# Patient Record
Sex: Female | Born: 1965 | Race: Black or African American | Hispanic: No | Marital: Single | Smoking: Former smoker
Health system: Southern US, Community
[De-identification: ages and names within clinical notes are randomized; demographics above are authoritative.]

## PROBLEM LIST (undated history)

## (undated) DIAGNOSIS — A749 Chlamydial infection, unspecified: Secondary | ICD-10-CM

## (undated) DIAGNOSIS — E079 Disorder of thyroid, unspecified: Secondary | ICD-10-CM

## (undated) DIAGNOSIS — I2510 Atherosclerotic heart disease of native coronary artery without angina pectoris: Secondary | ICD-10-CM

## (undated) DIAGNOSIS — I219 Acute myocardial infarction, unspecified: Secondary | ICD-10-CM

## (undated) DIAGNOSIS — F329 Major depressive disorder, single episode, unspecified: Secondary | ICD-10-CM

## (undated) DIAGNOSIS — N649 Disorder of breast, unspecified: Secondary | ICD-10-CM

## (undated) DIAGNOSIS — F1910 Other psychoactive substance abuse, uncomplicated: Secondary | ICD-10-CM

## (undated) DIAGNOSIS — R87619 Unspecified abnormal cytological findings in specimens from cervix uteri: Secondary | ICD-10-CM

## (undated) DIAGNOSIS — E7800 Pure hypercholesterolemia, unspecified: Secondary | ICD-10-CM

## (undated) DIAGNOSIS — IMO0002 Reserved for concepts with insufficient information to code with codable children: Secondary | ICD-10-CM

## (undated) DIAGNOSIS — R7303 Prediabetes: Secondary | ICD-10-CM

## (undated) DIAGNOSIS — G43909 Migraine, unspecified, not intractable, without status migrainosus: Secondary | ICD-10-CM

## (undated) DIAGNOSIS — F419 Anxiety disorder, unspecified: Principal | ICD-10-CM

## (undated) DIAGNOSIS — A599 Trichomoniasis, unspecified: Secondary | ICD-10-CM

## (undated) DIAGNOSIS — F32A Depression, unspecified: Secondary | ICD-10-CM

## (undated) DIAGNOSIS — I10 Essential (primary) hypertension: Secondary | ICD-10-CM

## (undated) DIAGNOSIS — E0590 Thyrotoxicosis, unspecified without thyrotoxic crisis or storm: Secondary | ICD-10-CM

## (undated) DIAGNOSIS — I519 Heart disease, unspecified: Secondary | ICD-10-CM

## (undated) DIAGNOSIS — D649 Anemia, unspecified: Secondary | ICD-10-CM

## (undated) HISTORY — PX: BREAST EXCISIONAL BIOPSY: SUR124

## (undated) HISTORY — PX: ENDOMETRIAL ABLATION W/ NOVASURE: SUR434

## (undated) HISTORY — PX: CERVICAL CONE BIOPSY: SUR198

## (undated) HISTORY — DX: Chlamydial infection, unspecified: A74.9

## (undated) HISTORY — DX: Unspecified abnormal cytological findings in specimens from cervix uteri: R87.619

## (undated) HISTORY — PX: ECTOPIC PREGNANCY SURGERY: SHX613

## (undated) HISTORY — PX: BREAST SURGERY: SHX581

## (undated) HISTORY — DX: Disorder of breast, unspecified: N64.9

## (undated) HISTORY — DX: Anemia, unspecified: D64.9

## (undated) HISTORY — DX: Reserved for concepts with insufficient information to code with codable children: IMO0002

## (undated) HISTORY — PX: GROIN MASS OPEN BIOPSY: SHX1714

## (undated) HISTORY — PX: TUBAL LIGATION: SHX77

## (undated) HISTORY — DX: Trichomoniasis, unspecified: A59.9

## (undated) HISTORY — DX: Major depressive disorder, single episode, unspecified: F32.9

## (undated) HISTORY — DX: Anxiety disorder, unspecified: F41.9

---

## 1998-06-13 ENCOUNTER — Other Ambulatory Visit: Admission: RE | Admit: 1998-06-13 | Discharge: 1998-06-13 | Payer: Self-pay | Admitting: Obstetrics

## 2000-02-21 ENCOUNTER — Other Ambulatory Visit: Admission: RE | Admit: 2000-02-21 | Discharge: 2000-02-21 | Payer: Self-pay | Admitting: Obstetrics

## 2002-11-19 ENCOUNTER — Ambulatory Visit (HOSPITAL_COMMUNITY): Admission: RE | Admit: 2002-11-19 | Discharge: 2002-11-19 | Payer: Self-pay | Admitting: Obstetrics

## 2002-11-19 ENCOUNTER — Encounter: Payer: Self-pay | Admitting: Obstetrics

## 2004-08-08 ENCOUNTER — Emergency Department (HOSPITAL_COMMUNITY): Admission: EM | Admit: 2004-08-08 | Discharge: 2004-08-08 | Payer: Self-pay | Admitting: Emergency Medicine

## 2006-10-29 ENCOUNTER — Other Ambulatory Visit: Admission: RE | Admit: 2006-10-29 | Discharge: 2006-10-29 | Payer: Self-pay | Admitting: Family Medicine

## 2007-03-19 ENCOUNTER — Encounter (INDEPENDENT_AMBULATORY_CARE_PROVIDER_SITE_OTHER): Payer: Self-pay | Admitting: Obstetrics

## 2007-03-19 ENCOUNTER — Ambulatory Visit (HOSPITAL_COMMUNITY): Admission: RE | Admit: 2007-03-19 | Discharge: 2007-03-19 | Payer: Self-pay | Admitting: Obstetrics

## 2007-07-28 ENCOUNTER — Emergency Department (HOSPITAL_COMMUNITY): Admission: EM | Admit: 2007-07-28 | Discharge: 2007-07-28 | Payer: Self-pay | Admitting: Emergency Medicine

## 2009-04-20 ENCOUNTER — Emergency Department (HOSPITAL_COMMUNITY): Admission: EM | Admit: 2009-04-20 | Discharge: 2009-04-20 | Payer: Self-pay | Admitting: Emergency Medicine

## 2010-01-04 ENCOUNTER — Emergency Department (HOSPITAL_COMMUNITY)
Admission: EM | Admit: 2010-01-04 | Discharge: 2010-01-04 | Payer: Self-pay | Source: Home / Self Care | Admitting: Emergency Medicine

## 2010-02-18 ENCOUNTER — Encounter: Payer: Self-pay | Admitting: Obstetrics

## 2010-06-12 NOTE — Op Note (Signed)
NAME:  [name], [name] NO.:  [id number]   MEDICAL RECORD NO.:  [id number]          PATIENT TYPE:  AMB   LOCATION:  SDC                           FACILITY:  [facility]   PHYSICIAN:  Kathreen Cosier, M.D.DATE OF BIRTH:  Jul 01, 1965   DATE OF PROCEDURE:  03/19/2007  DATE OF DISCHARGE:                               OPERATIVE REPORT   PREOPERATIVE DIAGNOSIS:  Dysfunctional uterine bleeding.   POSTOPERATIVE DIAGNOSIS:  Endometrial polyp.   PROCEDURE:  Hysteroscopy D&C, NovaSure ablation.  Using MAC, patient in  lithotomy position, perineum and vagina prepped and draped, bladder  emptied with a straight catheter.  Bimanual exam revealed uterus to be  normal size, negative adnexa.  Speculum placed in the vagina.  Cervix  grasped with a tenaculum.  Endocervix curetted.  Small amount of tissue  obtained.  Endometrial cavity sounded to 11 cm.  Cervical length  measured at 6 cm, giving a cavity length of 5 cm.  Cervix dilated #27  Shawnie Pons and the hysteroscope inserted.  She was noted to have a large  endometrial polyp.  Scope removed and a sharp curettage performed, a  large amount of tissue obtained.  NovaSure device inserted and the  cavity integrity test passed.  Cavity width was 4.7 cm.  The cavity was  ablated at a power of 108 watts for 1 minute and 35 seconds.  Repeat  hysteroscopy repeated and revealed that the cavity to be totally  ablated.  Fluid deficit was 85 mL.  The patient tolerated the procedure  well and taken to recovery room in good condition.           ______________________________  Kathreen Cosier, M.D.     BAM/MEDQ  D:  03/19/2007  T:  03/20/2007  Job:  [id number]

## 2010-10-19 LAB — CBC
HCT: 36
Hemoglobin: 12.2
MCHC: 33.9
MCV: 82.1
Platelets: 241
RBC: 4.38
RDW: 14.4
WBC: 5

## 2010-10-19 LAB — PREGNANCY, URINE: Preg Test, Ur: NEGATIVE

## 2010-11-05 ENCOUNTER — Other Ambulatory Visit: Payer: Self-pay | Admitting: Obstetrics and Gynecology

## 2010-11-05 DIAGNOSIS — Z1231 Encounter for screening mammogram for malignant neoplasm of breast: Secondary | ICD-10-CM

## 2010-11-06 ENCOUNTER — Ambulatory Visit (INDEPENDENT_AMBULATORY_CARE_PROVIDER_SITE_OTHER): Payer: Self-pay | Admitting: *Deleted

## 2010-11-06 ENCOUNTER — Encounter (HOSPITAL_COMMUNITY): Payer: Self-pay

## 2010-11-06 ENCOUNTER — Other Ambulatory Visit: Payer: Self-pay | Admitting: Obstetrics and Gynecology

## 2010-11-06 ENCOUNTER — Encounter: Payer: Self-pay | Admitting: *Deleted

## 2010-11-06 ENCOUNTER — Ambulatory Visit (HOSPITAL_COMMUNITY): Payer: Self-pay | Attending: Obstetrics and Gynecology

## 2010-11-06 VITALS — BP 113/81 | HR 74 | Temp 97.3°F | Ht 63.0 in | Wt 116.7 lb

## 2010-11-06 DIAGNOSIS — N63 Unspecified lump in unspecified breast: Secondary | ICD-10-CM

## 2010-11-06 DIAGNOSIS — Z01419 Encounter for gynecological examination (general) (routine) without abnormal findings: Secondary | ICD-10-CM

## 2010-11-06 NOTE — Progress Notes (Signed)
No complaints today.  Pap Smear:    Pap smear Completed today. Last Pap smear was four years ago per patient. Patient has had a history of an abnormal Pap smear many years ago. No Pap smear results in EPIC.  Physical exam: Breasts      Breasts symmetrical. No skin abnormalities. No nipple retraction and no nipple discharge.No lymphadenopathy. No lumps palpated in left breast. Small pea sized lump palpated in the right upper breast. Referred patient to the [facility] of [facility] for diagnostic mammogram and possible ultrasound on November 08, 2010 at 0930.     Pelvic/Bimanual   Ext Genitalia      No lesions and no swelling observed on external genitalia. No discharge observed.     Vagina      Vagina pink and of normal texture. No lesions observed. Thick white vaginal discharge observed with odor. Patient did state she noticed some vaginal discharge lately.  Wet prep obtained.     Cervix      Cervix present. Cervix pink and normal texture.        Uterus      Uterus present and palpable. Uterus of normal size and position.       Adnexae      Ovaries present. Ovaries not palpable. No tenderness on palpation.     Rectovaginal      No rectal exam completed. No skin abnormalities in rectal area observed on exam.

## 2010-11-06 NOTE — Patient Instructions (Signed)
Taught patient how to do BSE and gave patient education materials. Referred patient to the [facility] of [facility] for Diagnostic Mammogram and possible ultrasound. Appointment is scheduled for November 08, 2010 at 0930. Gave patient appointment and patient stated she would be there. Told patient will call with results. Patient verbalized understanding.

## 2010-11-07 LAB — WET PREP, GENITAL

## 2010-11-08 ENCOUNTER — Ambulatory Visit
Admission: RE | Admit: 2010-11-08 | Discharge: 2010-11-08 | Disposition: A | Discharge status: Home / Self Care | Payer: No Typology Code available for payment source | Source: Ambulatory Visit | Attending: Obstetrics and Gynecology | Admitting: Obstetrics and Gynecology

## 2010-11-08 ENCOUNTER — Other Ambulatory Visit: Payer: Self-pay | Admitting: Obstetrics and Gynecology

## 2010-11-08 DIAGNOSIS — N63 Unspecified lump in unspecified breast: Secondary | ICD-10-CM

## 2010-11-09 ENCOUNTER — Telehealth: Payer: Self-pay | Admitting: *Deleted

## 2010-11-09 NOTE — Telephone Encounter (Signed)
Called patient at 1250 to discuss results from [facility] clinic on 11/06/10. Patients pap smear and wet prep came back showing Trichomonas. Told patient the results. Dr. Jolayne Panther has ordered a prescription for Flagyl 500 mg PO BID for 7 days with no refills. Asked patient which pharmacy she would like this medication sent to. [facility] is cheaper than [facility] and patient doesn't have any insurance. She asked if I would call into [facility] at [facility] [address] [phone]). Told patient her partner would need to be treated also. Told her he can go to the [facility] Department if he does not have a physician. Told her to not drink alcohol while using this prescription and the side effects. Talked with patient about diagnostic mammogram and ultrasound results. Told her that she needs have a left breast ultrasound completed in 6 months to follow up. Told patient per BCCCP guidelines will not need a pap smear for 3 years but let her know if would like to have one done at 1-2 years can come to one of our free pap smear screenings that we offer. Patient verbalized understanding.  Called [facility] pharmacy per patients request at 1430 and spoke with Donnie. Gave order for Flagyl 500 mg PO BID for 7 days with no refills per Dr. Jolayne Panther. Order verified.

## 2011-04-03 ENCOUNTER — Other Ambulatory Visit: Payer: Self-pay | Admitting: Obstetrics and Gynecology

## 2011-04-03 DIAGNOSIS — N6009 Solitary cyst of unspecified breast: Secondary | ICD-10-CM

## 2011-05-07 ENCOUNTER — Ambulatory Visit
Admission: RE | Admit: 2011-05-07 | Discharge: 2011-05-07 | Disposition: A | Discharge status: Home / Self Care | Payer: No Typology Code available for payment source | Source: Ambulatory Visit | Attending: Obstetrics and Gynecology | Admitting: Obstetrics and Gynecology

## 2011-05-07 DIAGNOSIS — N6009 Solitary cyst of unspecified breast: Secondary | ICD-10-CM

## 2011-07-02 ENCOUNTER — Emergency Department (HOSPITAL_COMMUNITY)
Admission: EM | Admit: 2011-07-02 | Discharge: 2011-07-02 | Disposition: A | Discharge status: Home / Self Care | Payer: Self-pay | Attending: Emergency Medicine | Admitting: Emergency Medicine

## 2011-07-02 ENCOUNTER — Emergency Department (HOSPITAL_COMMUNITY): Payer: Self-pay

## 2011-07-02 ENCOUNTER — Encounter (HOSPITAL_COMMUNITY): Payer: Self-pay | Admitting: Emergency Medicine

## 2011-07-02 DIAGNOSIS — M79609 Pain in unspecified limb: Secondary | ICD-10-CM | POA: Insufficient documentation

## 2011-07-02 DIAGNOSIS — M25559 Pain in unspecified hip: Secondary | ICD-10-CM | POA: Insufficient documentation

## 2011-07-02 DIAGNOSIS — M25551 Pain in right hip: Secondary | ICD-10-CM

## 2011-07-02 LAB — URINALYSIS, ROUTINE W REFLEX MICROSCOPIC
Bilirubin Urine: NEGATIVE
Leukocytes, UA: NEGATIVE
Nitrite: NEGATIVE
Specific Gravity, Urine: 1.025 (ref 1.005–1.030)
pH: 6.5 (ref 5.0–8.0)

## 2011-07-02 MED ORDER — KETOROLAC TROMETHAMINE 30 MG/ML IJ SOLN
30.0000 mg | Freq: Once | INTRAMUSCULAR | Status: AC
  Administered 2011-07-02: 30 mg via INTRAMUSCULAR
  Filled 2011-07-02: qty 1

## 2011-07-02 MED ORDER — OXYCODONE-ACETAMINOPHEN 5-325 MG PO TABS
1.0000 | ORAL_TABLET | Freq: Once | ORAL | Status: AC
  Administered 2011-07-02: 1 via ORAL
  Filled 2011-07-02: qty 1

## 2011-07-02 MED ORDER — DIAZEPAM 5 MG PO TABS: 5.0000 mg | ORAL_TABLET | Freq: Two times a day (BID) | ORAL | Status: AC | PRN

## 2011-07-02 MED ORDER — NAPROXEN 500 MG PO TABS: 500.0000 mg | ORAL_TABLET | Freq: Two times a day (BID) | ORAL | Status: AC

## 2011-07-02 MED ORDER — CYCLOBENZAPRINE HCL 10 MG PO TABS
5.0000 mg | ORAL_TABLET | Freq: Once | ORAL | Status: AC
  Administered 2011-07-02: 5 mg via ORAL
  Filled 2011-07-02: qty 1

## 2011-07-02 NOTE — ED Notes (Signed)
Pt has returned from vascular lab. Pa is aware of study results

## 2011-07-02 NOTE — ED Provider Notes (Signed)
History     CSN: [id number]  Arrival date & time 07/02/11  0840   First MD Initiated Contact with Patient 07/02/11 [phone]      Chief Complaint  Patient presents with  . Hip Pain    (Consider location/radiation/quality/duration/timing/severity/associated sxs/prior treatment) HPI  46 year old female presents complaining of right hip and leg pain. Patient states yesterday when stepping off her Zenaida Niece she noticed some tenderness to her right hip. States she did not pay any attention to it. She went to sleep and this morning she woke up with significant pain to the right thigh.  Onset is gradual.  Pain is sharp, constant, worsening with positional change. She also experiencing tingling sensation to her thigh. There are no associated fever, chills, urinary symptoms, recent trauma, or rash. She denies any prior history of back pain and hip pain. Denies urinary/bowel incontinence, or caudal equina sxs.    Past Medical History  Diagnosis Date  . Abnormal Pap smear     Colpo (twice)  . Anemia   . Chlamydia     years ago  . Trichomonas     years ago  . Breast disorder     soreness in left breast   . Mental disorder     depression    Past Surgical History  Procedure Date  . Cesarean section     2 c-sections  . Breast surgery     lump removed from right breast  . Nova sure   . Groin mass open biopsy   . Cervical cone biopsy     Family History  Problem Relation Age of Onset  . Cancer Mother     breast  . Hypertension Brother   . Hypertension Sister     History  Substance Use Topics  . Smoking status: Current Everyday Smoker -- 0.5 packs/day for 28 years    Types: Cigarettes  . Smokeless tobacco: Never Used  . Alcohol Use: Yes    OB History    Grav Para Term Preterm Abortions TAB SAB Ect Mult Living   4 2 2  2  1 1  2       Review of Systems  Constitutional: Negative for fever.  Respiratory: Negative for shortness of breath.   Genitourinary: Negative for flank pain.    Musculoskeletal: Negative for back pain.  Skin: Negative for rash.    Allergies  Demerol and Novocain  Home Medications   Current Outpatient Rx  Name Route Sig Dispense Refill  . IBUPROFEN 200 MG PO TABS Oral Take 200 mg by mouth every 8 (eight) hours as needed. For pain    . LORATADINE 10 MG PO TABS Oral Take 10 mg by mouth daily.      BP 111/76  Pulse 86  Temp(Src) 97.2 F (36.2 C) (Oral)  Resp 18  SpO2 99%  Physical Exam  Nursing note and vitals reviewed. Constitutional: She appears well-developed and well-nourished.       Tearful, appears to be in pain  HENT:  Head: Normocephalic and atraumatic.  Eyes: Conjunctivae are normal.  Neck: Normal range of motion. Neck supple.  Pulmonary/Chest: She exhibits no tenderness.  Abdominal: There is no tenderness. There is no CVA tenderness.  Musculoskeletal: She exhibits no edema.       R hip: diffused tenderness on palpation without deformity noted.  Refused to perform ROM due to pain.    R thigh: tenderness to lateral aspect of mid thigh, without overlying skin changes noted.  Sensation intact.  Neurological:  She is alert.  Skin: Skin is warm. No rash noted.    ED Course  Procedures (including critical care time)  Labs Reviewed - No data to display No results found.   No diagnosis found.  Results for orders placed during the hospital encounter of 07/02/11  URINALYSIS, ROUTINE W REFLEX MICROSCOPIC      Component Value Range   Color, Urine YELLOW  YELLOW    APPearance CLEAR  CLEAR    Specific Gravity, Urine 1.025  1.005 - 1.030    pH 6.5  5.0 - 8.0    Glucose, UA NEGATIVE  NEGATIVE (mg/dL)   Hgb urine dipstick NEGATIVE  NEGATIVE    Bilirubin Urine NEGATIVE  NEGATIVE    Ketones, ur NEGATIVE  NEGATIVE (mg/dL)   Protein, ur NEGATIVE  NEGATIVE (mg/dL)   Urobilinogen, UA 1.0  0.0 - 1.0 (mg/dL)   Nitrite NEGATIVE  NEGATIVE    Leukocytes, UA NEGATIVE  NEGATIVE    Dg Hip Complete Right  07/02/2011  *RADIOLOGY REPORT*   Clinical Data: Pain  RIGHT HIP - COMPLETE 2+ VIEW  Comparison: None.  Findings: Three views of the right hip submitted.  No acute fracture or subluxation.  No radiopaque foreign body.  A few pelvic phleboliths are noted.  IMPRESSION: No acute fracture or subluxation.  Original Report Authenticated By: Natasha Mead, M.D.   Dg Femur Right  07/02/2011  *RADIOLOGY REPORT*  Clinical Data: Right femoral pain, no known injury  RIGHT FEMUR - 2 VIEW  Comparison: None  Findings: Bone mineralization normal. Joint spaces preserved. No fracture, dislocation, or bone destruction. Visualized portion of right pelvis appears intact.  IMPRESSION: Normal exam.  Original Report Authenticated By: Lollie Marrow, M.D.      MDM  R hip and R thigh pain, no recent trauma.  Pain reproducible to lateral aspect of R hip and R thigh, suggestive of musculoskeletal (muscle pulled).  No midline spine tenderness.  abd nontender, no abd bruits noted.  No red flags.  Pain medication and muscle relaxant given.  Will reexam.     10:38 AM Pt continues to endorse pain to her R hip and R thigh.  Sts it feels like hip is out of place.  Difficult to fully assess as pt refuse to move her extremity.  NO obvious dislocation noted.  Will xray R hip and R femur for further eval.   11:58 AM Pt denies any risk factors for DVT or PE.  However, due to her complaint of subjective thigh swelling, and xray of R hip and R femur shows no acute finding, i will order venous doppler study to r/o DVT.  UA ordered.   12:53 PM Pt continues to endorse constant pain.  Venous doppler shows no evidence of DVT.  UA negative for infection.  Xray unremarkable.  WIll give Toradol IM.  I discussed with my attending, who will see pt for further assessment.    2:52 PM Pain improves with Toradol. Will d/c with NSAIDS as treatment.  F/u instruction and strict return precaution discussed.  Pt voice understanding and agrees with plan.    Fayrene Helper, PA-C 07/02/11 1453

## 2011-07-02 NOTE — ED Notes (Signed)
Pt c/o right hip pain starting yesterday; pt denies obvious injury; pt sts hx of same in past

## 2011-07-02 NOTE — ED Provider Notes (Signed)
Medical screening examination/treatment/procedure(s) were conducted as a shared visit with non-physician practitioner(s) and myself.  I personally evaluated the patient during the encounter  Rt hip/lateral leg pain. No trauma. No h/o cancer. No h/o VTE. Denies numbness/tingling/weakness. No abdominal pain/n/v. Denies fever/chills.  No abd ttp/r/g. Min ttp lateral aspect of thigh, no diffuse thigh ttp or swelling. Min pain with int/ext rotation of hip.  XR . Venous ultrasound negative for DVT. The patient does complain you pain. I think that given the fact that she's not on any statin as well as the localizing thigh pain but this is less likely a myositis or inflammatory process of the muscle. She's not do strenuous physical activities but in her respiratory band syndrome. There is no fracture on x-ray. No not have abdominal pain I think that her symptoms are less likely related to an intra-abdominal process or muscle abscess.  Forbes Cellar, MD 07/02/11 [phone]

## 2011-07-02 NOTE — Discharge Instructions (Signed)
Hip Pain  The hips join the upper legs to the lower pelvis. The bones, cartilage, tendons, and muscles of the hip joint perform a lot of work each day holding your body weight and allowing you to move around.  Hip pain is a common symptom. It can range from a minor ache to severe pain on 1 or both hips. Pain may be felt on the inside of the hip joint near the groin, or the outside near the buttocks and upper thigh. There may be swelling or stiffness as well. It occurs more often when a person walks or performs activity. There are many reasons hip pain can develop.  CAUSES   It is important to work with your caregiver to identify the cause since many conditions can impact the bones, cartilage, muscles, and tendons of the hips. Causes for hip pain include:   Broken (fractured) bones.   Separation of the thighbone from the hip socket (dislocation).   Torn cartilage of the hip joint.   Swelling (inflammation) of a tendon (tendonitis), the sac within the hip joint (bursitis), or a joint.   A weakening in the abdominal wall (hernia), affecting the nerves to the hip.   Arthritis in the hip joint or lining of the hip joint.   Pinched nerves in the back, hip, or upper thigh.   A bulging disc in the spine (herniated disc).   Rarely, bone infection or cancer.  DIAGNOSIS   The location of your hip pain will help your caregiver understand what may be causing the pain. A diagnosis is based on your medical history, your symptoms, results from your physical exam, and results from diagnostic tests. Diagnostic tests may include X-ray exams, a computerized magnetic scan (magnetic resonance imaging, MRI), or bone scan.  TREATMENT   Treatment will depend on the cause of your hip pain. Treatment may include:   Limiting activities and resting until symptoms improve.   Crutches or other walking supports (a cane or brace).   Ice, elevation, and compression.   Physical therapy or home exercises.   Shoe inserts or special  shoes.   Losing weight.   Medications to reduce pain.   Undergoing surgery.  HOME CARE INSTRUCTIONS    Only take over-the-counter or prescription medicines for pain, discomfort, or fever as directed by your caregiver.   Put ice on the injured area:   Put ice in a plastic bag.   Place a towel between your skin and the bag.   Leave the ice on for 15 to 20 minutes at a time, 3 to 4 times a day.   Keep your leg raised (elevated) when possible to lessen swelling.   Avoid activities that cause pain.   Follow specific exercises as directed by your caregiver.   Sleep with a pillow between your legs on your most comfortable side.   Record how often you have hip pain, the location of the pain, and what it feels like. This information may be helpful to you and your caregiver.   Ask your caregiver about returning to work or sports and whether you should drive.   Follow up with your caregiver for further exams, therapy, or testing as directed.  SEEK MEDICAL CARE IF:    Your pain or swelling continues or worsens after 1 week.   You are feeling unwell or have chills.   You have increasing difficulty with walking.   You have a loss of sensation or other new symptoms.   You have questions   or concerns.  SEEK IMMEDIATE MEDICAL CARE IF:    You cannot put weight on the affected hip.   You have fallen.   You have a sudden increase in pain and swelling in your hip.   You have a fever.  MAKE SURE YOU:    Understand these instructions.   Will watch your condition.   Will get help right away if you are not doing well or get worse.  Document Released: 07/04/2009 Document Revised: 01/03/2011 Document Reviewed: 07/04/2009  [employer] Patient Information 2012 [employer].

## 2011-07-02 NOTE — Progress Notes (Signed)
Orthopedic Tech Progress Note Patient Details:  [name] [name] 1965-09-14 [id number]  Ortho Devices Type of Ortho Device: Crutches Ortho Device/Splint Interventions: Application   Earsie Humm T 07/02/2011, 3:10 PM

## 2011-07-02 NOTE — ED Notes (Signed)
Pt medicated for pain after much anxiety expressed about getting a shot

## 2011-07-02 NOTE — ED Notes (Signed)
ORTHO PAGED FOR CRUTCHES

## 2011-07-02 NOTE — Progress Notes (Signed)
*  PRELIMINARY RESULTS* Vascular Ultrasound Right lower extremity venous duplex has been completed.  Preliminary findings: Right= No evidence of DVT or baker's cyst.  Farrel Demark RDMS 07/02/2011, 12:37 PM

## 2011-10-17 ENCOUNTER — Telehealth (HOSPITAL_COMMUNITY): Payer: Self-pay | Admitting: *Deleted

## 2011-10-17 NOTE — Telephone Encounter (Signed)
Telephoned patient at home # and left message with a female.

## 2011-10-17 NOTE — Telephone Encounter (Signed)
Patient returned call. Advised patient was time to schedule diagnostic mammogram and left breast ultrasound. (oct 2013) Patient will call to schedule.

## 2011-10-18 ENCOUNTER — Other Ambulatory Visit: Payer: Self-pay | Admitting: Obstetrics and Gynecology

## 2011-10-18 DIAGNOSIS — D249 Benign neoplasm of unspecified breast: Secondary | ICD-10-CM

## 2011-11-04 ENCOUNTER — Ambulatory Visit
Admission: RE | Admit: 2011-11-04 | Discharge: 2011-11-04 | Disposition: A | Discharge status: Home / Self Care | Payer: No Typology Code available for payment source | Source: Ambulatory Visit | Attending: Obstetrics and Gynecology | Admitting: Obstetrics and Gynecology

## 2011-11-04 DIAGNOSIS — D249 Benign neoplasm of unspecified breast: Secondary | ICD-10-CM

## 2012-06-08 ENCOUNTER — Other Ambulatory Visit: Payer: Self-pay | Admitting: *Deleted

## 2012-06-08 DIAGNOSIS — N63 Unspecified lump in unspecified breast: Secondary | ICD-10-CM

## 2012-06-09 ENCOUNTER — Ambulatory Visit (HOSPITAL_COMMUNITY)
Admission: RE | Admit: 2012-06-09 | Discharge: 2012-06-09 | Disposition: A | Discharge status: Home / Self Care | Payer: Self-pay | Source: Ambulatory Visit | Attending: Obstetrics and Gynecology | Admitting: Obstetrics and Gynecology

## 2012-06-09 ENCOUNTER — Ambulatory Visit (HOSPITAL_COMMUNITY): Payer: Self-pay

## 2012-06-09 ENCOUNTER — Encounter (HOSPITAL_COMMUNITY): Payer: Self-pay

## 2012-06-09 VITALS — BP 102/64 | Temp 98.7°F | Ht 63.0 in | Wt 131.6 lb

## 2012-06-09 DIAGNOSIS — Z1239 Encounter for other screening for malignant neoplasm of breast: Secondary | ICD-10-CM

## 2012-06-09 DIAGNOSIS — N6325 Unspecified lump in the left breast, overlapping quadrants: Secondary | ICD-10-CM | POA: Insufficient documentation

## 2012-06-09 HISTORY — DX: Depression, unspecified: F32.A

## 2012-06-09 HISTORY — DX: Major depressive disorder, single episode, unspecified: F32.9

## 2012-06-09 NOTE — Progress Notes (Signed)
Complaints of left breast lump  Pap Smear:    Pap smear not completed today. Last Pap smear was 11/06/2010 at [facility] and normal. Patient stated that she has a history of two abnormal Pap smears one that was in 1997 that she had a cold knife cone completed for follow up and one in 2001 that a colposcopy was completed. No history of abnormal Pap smears since. Last Pap smear result is in EPIC.     Physical exam: Breasts Breasts symmetrical. No skin abnormalities bilateral breasts. Slight bilateral breast nipple retraction. No nipple discharge bilateral breasts. No lymphadenopathy. No lumps palpated right breast. Palpated a lump within the left breast at 3 o'clock 1 cm from the nipple. Patient complained of tenderness on palpated of lump within left breast and around it. Referred patient to the [facility] for left breast ultrasound per recommendation. Appointment scheduled for Monday, Jun 15, 2012 at 1330.       Pelvic/Bimanual No Pap smear completed today since last Pap smear was 11/06/2010. Pap smear not indicated per BCCCP guidelines.

## 2012-06-09 NOTE — Patient Instructions (Addendum)
Taught patient how to perform BSE. Patient did not need a Pap smear today due to last Pap smear was 11/06/2010. Told patient about free cervical cancer screenings to receive a Pap smear if would like one later this year or let her know can schedule Pap smear with BCCCP after October 2014 due to her history of abnormal Pap smears. Referred patient to the [facility] for left breast ultrasound per recommendation. Appointment scheduled for Monday, Jun 15, 2012 at 1330. Patient aware of appointment and will be there. Patient verbalized understanding.

## 2012-06-15 ENCOUNTER — Ambulatory Visit
Admission: RE | Admit: 2012-06-15 | Discharge: 2012-06-15 | Disposition: A | Discharge status: Home / Self Care | Payer: No Typology Code available for payment source | Source: Ambulatory Visit | Attending: Obstetrics and Gynecology | Admitting: Obstetrics and Gynecology

## 2012-06-15 DIAGNOSIS — N63 Unspecified lump in unspecified breast: Secondary | ICD-10-CM

## 2012-10-12 ENCOUNTER — Other Ambulatory Visit: Payer: Self-pay | Admitting: *Deleted

## 2012-10-12 DIAGNOSIS — N6320 Unspecified lump in the left breast, unspecified quadrant: Secondary | ICD-10-CM

## 2012-10-13 ENCOUNTER — Encounter (HOSPITAL_COMMUNITY): Payer: Self-pay

## 2012-10-13 ENCOUNTER — Ambulatory Visit (HOSPITAL_COMMUNITY)
Admission: RE | Admit: 2012-10-13 | Discharge: 2012-10-13 | Disposition: A | Discharge status: Home / Self Care | Payer: No Typology Code available for payment source | Source: Ambulatory Visit | Attending: Obstetrics and Gynecology | Admitting: Obstetrics and Gynecology

## 2012-10-13 VITALS — BP 96/58 | Temp 98.8°F | Ht 63.0 in | Wt 128.8 lb

## 2012-10-13 DIAGNOSIS — N6315 Unspecified lump in the right breast, overlapping quadrants: Secondary | ICD-10-CM | POA: Insufficient documentation

## 2012-10-13 DIAGNOSIS — Z1239 Encounter for other screening for malignant neoplasm of breast: Secondary | ICD-10-CM

## 2012-10-13 NOTE — Assessment & Plan Note (Signed)
Referred patient to the [facility] of [facility] for diagnostic mammogram. Appointment scheduled for Friday, October 23, 2012 at 1400.

## 2012-10-13 NOTE — Patient Instructions (Signed)
Patient did not need a Pap smear today due to last Pap smear was 11/06/2010. Told patient about free cervical cancer screenings to receive a Pap smear later this year or early next year. Let her know BCCCP will cover Pap smears every 3 years unless has a history of abnormal Pap smears. Referred patient to the [facility] of [facility] for diagnostic mammogram. Appointment scheduled for Friday, October 23, 2012 at 1400. Patient aware of appointment and will be there. Smoking cessation discussed with patient.  [name] [name] verbalized understanding.  [name] [name], [name], RN 4:09 PM

## 2012-10-13 NOTE — Progress Notes (Addendum)
Complaints of left breast lump x 2 weeks that patient states is painful. Patient states pain is constant rating pain at a 6 out 10.  Pap Smear: Pap smear not completed today. Last Pap smear was 11/06/2010 at [facility] and normal. Patient stated that she has a history of two abnormal Pap smears one that was in 1997 that she had a cold knife cone completed for follow up and one in 2001 that a colposcopy was completed. No history of abnormal Pap smears since. Last Pap smear result is in EPIC.   Physical exam:  Breasts  Breasts symmetrical. No skin abnormalities bilateral breasts. Slight bilateral breast nipple retraction. No nipple discharge bilateral breasts. No lymphadenopathy. Palpated lump within the right breast at 9 o'clock 4 cm from the nipple. Palpated a moveable lump within the left breast at 3 o'clock 4 cm from the nipple. Patient complained of tenderness on palpated of lump within left breast and around it. Referred patient to the [facility] of [facility] for diagnostic mammogram. Appointment scheduled for Friday, October 23, 2012 at 1400.   Pelvic/Bimanual  No Pap smear completed today since last Pap smear was 11/06/2010. Pap smear not indicated per BCCCP guidelines.

## 2012-10-23 ENCOUNTER — Other Ambulatory Visit: Payer: Self-pay | Admitting: Obstetrics and Gynecology

## 2012-10-23 ENCOUNTER — Ambulatory Visit
Admission: RE | Admit: 2012-10-23 | Discharge: 2012-10-23 | Disposition: A | Discharge status: Home / Self Care | Payer: No Typology Code available for payment source | Source: Ambulatory Visit | Attending: Obstetrics and Gynecology | Admitting: Obstetrics and Gynecology

## 2012-10-23 DIAGNOSIS — N6320 Unspecified lump in the left breast, unspecified quadrant: Secondary | ICD-10-CM

## 2013-02-05 ENCOUNTER — Inpatient Hospital Stay (HOSPITAL_COMMUNITY)
Admission: EM | Admit: 2013-02-05 | Discharge: 2013-02-11 | DRG: 282 | Disposition: A | Discharge status: Home / Self Care | Payer: No Typology Code available for payment source | Attending: Cardiology | Admitting: Cardiology

## 2013-02-05 ENCOUNTER — Encounter (HOSPITAL_COMMUNITY): Payer: Self-pay | Admitting: Emergency Medicine

## 2013-02-05 ENCOUNTER — Emergency Department (HOSPITAL_COMMUNITY): Payer: Self-pay

## 2013-02-05 ENCOUNTER — Other Ambulatory Visit: Payer: Self-pay

## 2013-02-05 DIAGNOSIS — Z720 Tobacco use: Secondary | ICD-10-CM | POA: Diagnosis present

## 2013-02-05 DIAGNOSIS — I214 Non-ST elevation (NSTEMI) myocardial infarction: Principal | ICD-10-CM | POA: Diagnosis present

## 2013-02-05 DIAGNOSIS — F17200 Nicotine dependence, unspecified, uncomplicated: Secondary | ICD-10-CM | POA: Diagnosis present

## 2013-02-05 DIAGNOSIS — E0590 Thyrotoxicosis, unspecified without thyrotoxic crisis or storm: Secondary | ICD-10-CM | POA: Diagnosis present

## 2013-02-05 DIAGNOSIS — I2510 Atherosclerotic heart disease of native coronary artery without angina pectoris: Secondary | ICD-10-CM

## 2013-02-05 DIAGNOSIS — F1410 Cocaine abuse, uncomplicated: Secondary | ICD-10-CM | POA: Diagnosis present

## 2013-02-05 DIAGNOSIS — D649 Anemia, unspecified: Secondary | ICD-10-CM | POA: Diagnosis present

## 2013-02-05 DIAGNOSIS — E876 Hypokalemia: Secondary | ICD-10-CM | POA: Diagnosis present

## 2013-02-05 DIAGNOSIS — E079 Disorder of thyroid, unspecified: Secondary | ICD-10-CM | POA: Diagnosis present

## 2013-02-05 DIAGNOSIS — F1210 Cannabis abuse, uncomplicated: Secondary | ICD-10-CM | POA: Diagnosis present

## 2013-02-05 DIAGNOSIS — F1910 Other psychoactive substance abuse, uncomplicated: Secondary | ICD-10-CM | POA: Diagnosis present

## 2013-02-05 DIAGNOSIS — E785 Hyperlipidemia, unspecified: Secondary | ICD-10-CM

## 2013-02-05 HISTORY — DX: Atherosclerotic heart disease of native coronary artery without angina pectoris: I25.10

## 2013-02-05 HISTORY — DX: Other psychoactive substance abuse, uncomplicated: F19.10

## 2013-02-05 LAB — POCT I-STAT, CHEM 8
BUN: 6 mg/dL (ref 6–23)
CHLORIDE: 106 meq/L (ref 96–112)
CREATININE: 0.9 mg/dL (ref 0.50–1.10)
Calcium, Ion: 1.15 mmol/L (ref 1.12–1.23)
GLUCOSE: 120 mg/dL — AB (ref 70–99)
HEMATOCRIT: 38 % (ref 36.0–46.0)
Hemoglobin: 12.9 g/dL (ref 12.0–15.0)
POTASSIUM: 3.3 meq/L — AB (ref 3.7–5.3)
Sodium: 142 mEq/L (ref 137–147)
TCO2: 22 mmol/L (ref 0–100)

## 2013-02-05 LAB — CBC
HEMATOCRIT: 34.5 % — AB (ref 36.0–46.0)
HEMOGLOBIN: 12 g/dL (ref 12.0–15.0)
MCH: 28.4 pg (ref 26.0–34.0)
MCHC: 34.8 g/dL (ref 30.0–36.0)
MCV: 81.6 fL (ref 78.0–100.0)
Platelets: 288 10*3/uL (ref 150–400)
RBC: 4.23 MIL/uL (ref 3.87–5.11)
RDW: 13.4 % (ref 11.5–15.5)
WBC: 12.6 10*3/uL — AB (ref 4.0–10.5)

## 2013-02-05 LAB — POCT I-STAT TROPONIN I: Troponin i, poc: 0.39 ng/mL (ref 0.00–0.08)

## 2013-02-05 MED ORDER — ASPIRIN 81 MG PO CHEW
324.0000 mg | CHEWABLE_TABLET | Freq: Once | ORAL | Status: AC
  Administered 2013-02-05: 324 mg via ORAL

## 2013-02-05 MED ORDER — ASPIRIN 81 MG PO CHEW
CHEWABLE_TABLET | ORAL | Status: AC
  Filled 2013-02-05: qty 4

## 2013-02-05 MED ORDER — ONDANSETRON HCL 4 MG/2ML IJ SOLN
4.0000 mg | Freq: Once | INTRAMUSCULAR | Status: AC
  Administered 2013-02-05: 4 mg via INTRAVENOUS
  Filled 2013-02-05: qty 2

## 2013-02-05 MED ORDER — MORPHINE SULFATE 4 MG/ML IJ SOLN
4.0000 mg | Freq: Once | INTRAMUSCULAR | Status: AC
  Administered 2013-02-05: 4 mg via INTRAVENOUS
  Filled 2013-02-05: qty 1

## 2013-02-05 NOTE — ED Notes (Signed)
Nurse [facility] and Dr. Doy Mince in [place] notified of elevated I-stat Troponin of .39.

## 2013-02-05 NOTE — ED Provider Notes (Signed)
CSN: [id number]     Arrival date & time 02/05/13  2220 History   First MD Initiated Contact with Patient 02/05/13 2306     Chief Complaint  Patient presents with  . Panic Attack  . Chest Pain   (Consider location/radiation/quality/duration/timing/severity/associated sxs/prior Treatment) Patient is a 48 y.o. female presenting with chest pain. The history is provided by the patient.  Chest Pain [name] [name] is a 48 y.o. female who complains of chest pain that started at 4 PM today. The pain is persistent, and improving spontaneously. It was initially 9/10 now it is 7-8/10. The pain is dull. It radiates towards her left arm. She was initially seen by EMS, at work at 4 PM, but did not get transported. She again called EMS because the pain persisted. She did not receive any treatment by them, during transport. She's never had this previously. She has associated nausea and vomiting several times since the pain started. She denies shortness of breath, back pain, neck pain, headache, weakness, or dizziness. There are no other known modifying factors.   Past Medical History  Diagnosis Date  . Abnormal Pap smear     Colpo (twice)  . Anemia   . Chlamydia     years ago  . Trichomonas     years ago  . Breast disorder     soreness in left breast   . Depression    Past Surgical History  Procedure Laterality Date  . Cesarean section      2 c-sections  . Breast surgery      lump removed from right breast  . Nova sure    . Groin mass open biopsy    . Cervical cone biopsy     Family History  Problem Relation Age of Onset  . Cancer Mother     breast  . Hypertension Brother   . Kidney failure Brother   . Hypertension Sister    History  Substance Use Topics  . Smoking status: Current Every Day Smoker -- 0.50 packs/day for 28 years    Types: Cigarettes  . Smokeless tobacco: Never Used  . Alcohol Use: Yes     Comment: rarely   OB History   Grav Para Term Preterm Abortions TAB SAB Ect  Mult Living   4 2 2  2  1 1  2      Review of Systems  Cardiovascular: Positive for chest pain.  All other systems reviewed and are negative.    Allergies  Demerol and Novocain  Home Medications   Current Outpatient Rx  Name  Route  Sig  Dispense  Refill  . ibuprofen (ADVIL,MOTRIN) 200 MG tablet   Oral   Take 200 mg by mouth every 8 (eight) hours as needed. For pain         . loratadine (CLARITIN) 10 MG tablet   Oral   Take 10 mg by mouth daily.          BP 121/75  Pulse 79  Temp(Src) 97.5 F (36.4 C) (Oral)  Resp 22  SpO2 100%  LMP 02/03/2013 Physical Exam  Nursing note and vitals reviewed. Constitutional: She is oriented to person, place, and time. She appears well-developed.  Undernourished, she appears older than stated age  HENT:  Head: Normocephalic and atraumatic.  Eyes: Conjunctivae and EOM are normal. Pupils are equal, round, and reactive to light.  Neck: Normal range of motion and phonation normal. Neck supple.  Cardiovascular: Normal rate, regular rhythm and intact distal pulses.  Pulmonary/Chest: Effort normal and breath sounds normal. She exhibits no tenderness.  Abdominal: Soft. She exhibits no distension. There is no tenderness. There is no guarding.  Musculoskeletal: Normal range of motion. She exhibits no edema and no tenderness.  Neurological: She is alert and oriented to person, place, and time. She exhibits normal muscle tone.  Skin: Skin is warm and dry.  Psychiatric: Her behavior is normal. Judgment and thought content normal.  Mildly anxious    ED Course  Procedures (including critical care time) Medications  aspirin 81 MG chewable tablet (not administered)  aspirin chewable tablet 324 mg (324 mg Oral Given 02/05/13 2317)  morphine 4 MG/ML injection 4 mg (4 mg Intravenous Given 02/05/13 2321)  ondansetron (ZOFRAN) injection 4 mg (4 mg Intravenous Given 02/05/13 2321)    Patient Vitals for the past 24 hrs:  BP Temp Temp src Pulse Resp  SpO2  02/05/13 2315 121/75 mmHg 97.5 F (36.4 C) Oral 79 22 100 %  02/05/13 2227 107/73 mmHg 98.7 F (37.1 C) Oral 70 18 99 %   Initial troponin, positive, repeat testing, ordered, and we'll repeat EKG Repeat Trop I is increased. Repeat EKG, unchanged.  12:20 AM-Consult complete with Dr, Radford Pax. Patient case explained and discussed. She agrees to admit patient for further evaluation and treatment. Call ended at 00:25  11:20 PM Reevaluation with update and discussion. After initial assessment and treatment, an updated evaluation reveals CP down to 3/10. [name] [name]     EKG Interpretation    Date/Time:  Friday February 05 2013 23:46:50 EST Ventricular Rate:  77 PR Interval:  138 QRS Duration: 70 QT Interval:  391 QTC Calculation: 442 R Axis:   44 Text Interpretation:  Sinus rhythm Atrial premature complex Probable left atrial enlargement Since last tracing of earlier today No significant change was found Confirmed by [name] Callicott  MD, [name] [name] ([id number]) on 02/06/2013 12:07:49 AM            Date: 02/05/13 22:13  Rate: 73  Rhythm: normal sinus rhythm  QRS Axis: normal  PR and QT Intervals: normal  ST/T Wave abnormalities: nonspecific T wave changes  PR and QRS Conduction Disutrbances:none  Narrative Interpretation:   Old EKG Reviewed: changes noted  Repeat EKG ordered at 23:21  CRITICAL CARE Performed by: [name] [name] Total critical care time: 35 min. Critical care time was exclusive of separately billable procedures and treating other patients. Critical care was necessary to treat or prevent imminent or life-threatening deterioration. Critical care was time spent personally by me on the following activities: development of treatment plan with patient and/or surrogate as well as nursing, discussions with consultants, evaluation of patient's response to treatment, examination of patient, obtaining history from patient or surrogate, ordering and performing treatments and  interventions, ordering and review of laboratory studies, ordering and review of radiographic studies, pulse oximetry and re-evaluation of patient's condition.   Labs Review Labs Reviewed  CBC - Abnormal; Notable for the following:    WBC 12.6 (*)    HCT 34.5 (*)    All other components within normal limits  POCT I-STAT, CHEM 8 - Abnormal; Notable for the following:    Potassium 3.3 (*)    Glucose, Bld 120 (*)    All other components within normal limits  POCT I-STAT TROPONIN I - Abnormal; Notable for the following:    Troponin i, poc 0.39 (*)    All other components within normal limits  POCT I-STAT TROPONIN I - Abnormal; Notable for the following:  Troponin i, poc 1.34 (*)    All other components within normal limits  URINALYSIS, ROUTINE W REFLEX MICROSCOPIC  URINE RAPID DRUG SCREEN (HOSP PERFORMED)   Imaging Review Dg Chest Port 1 View  02/05/2013   CLINICAL DATA:  Chest pain and shortness of breath.  EXAM: PORTABLE CHEST - 1 VIEW  COMPARISON:  PA and lateral chest 08/08/2004.  FINDINGS: Lungs are clear. Heart size is normal. No pneumothorax or pleural fluid.  IMPRESSION: Negative chest.   Electronically Signed   By: Inge Rise M.D.   On: 02/05/2013 23:58    EKG Interpretation    Date/Time:  Friday February 05 2013 23:46:50 EST Ventricular Rate:  77 PR Interval:  138 QRS Duration: 70 QT Interval:  391 QTC Calculation: 442 R Axis:   44 Text Interpretation:  Sinus rhythm Atrial premature complex Probable left atrial enlargement Since last tracing of earlier today No significant change was found Confirmed by Verta Riedlinger  MD, Jisell Majer ([id number]) on 02/06/2013 12:07:49 AM            MDM   1. NSTEMI (non-ST elevated myocardial infarction)   2. Tobacco abuse      Evaluation is consistent with NSTEMI. She'll need cardiology evaluation and admission to the hospital for further testing. She is hemodynamically stable   Nursing Notes Reviewed/ Care Coordinated Applicable  Imaging Reviewed Interpretation of Laboratory Data incorporated into ED treatment    Richarda Blade, MD 02/06/13 [phone]

## 2013-02-05 NOTE — ED Notes (Addendum)
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 Hyperventilating, anxiety attack.  EMS saw her and told she was having a panic attack.  However, still having a panic attack, arms going to numb. 12 ekg lead wnl.

## 2013-02-06 ENCOUNTER — Encounter (HOSPITAL_COMMUNITY): Payer: Self-pay | Admitting: Cardiology

## 2013-02-06 DIAGNOSIS — I214 Non-ST elevation (NSTEMI) myocardial infarction: Secondary | ICD-10-CM | POA: Diagnosis present

## 2013-02-06 DIAGNOSIS — R072 Precordial pain: Secondary | ICD-10-CM

## 2013-02-06 LAB — URINALYSIS, ROUTINE W REFLEX MICROSCOPIC
Bilirubin Urine: NEGATIVE
Glucose, UA: NEGATIVE mg/dL
KETONES UR: 15 mg/dL — AB
LEUKOCYTES UA: NEGATIVE
NITRITE: NEGATIVE
Protein, ur: 30 mg/dL — AB
SPECIFIC GRAVITY, URINE: 1.022 (ref 1.005–1.030)
Urobilinogen, UA: 0.2 mg/dL (ref 0.0–1.0)
pH: 8 (ref 5.0–8.0)

## 2013-02-06 LAB — COMPREHENSIVE METABOLIC PANEL
ALK PHOS: 76 U/L (ref 39–117)
ALT: 12 U/L (ref 0–35)
AST: 61 U/L — ABNORMAL HIGH (ref 0–37)
Albumin: 3.4 g/dL — ABNORMAL LOW (ref 3.5–5.2)
BILIRUBIN TOTAL: 0.3 mg/dL (ref 0.3–1.2)
BUN: 6 mg/dL (ref 6–23)
CO2: 26 mEq/L (ref 19–32)
CREATININE: 0.85 mg/dL (ref 0.50–1.10)
Calcium: 8.9 mg/dL (ref 8.4–10.5)
Chloride: 107 mEq/L (ref 96–112)
GFR calc Af Amer: >90 mL/min (ref >90)
GFR, EST NON AFRICAN AMERICAN: 81 mL/min — AB (ref >90)
Glucose, Bld: 103 mg/dL — ABNORMAL HIGH (ref 70–99)
POTASSIUM: 4 meq/L (ref 3.7–5.3)
Sodium: 144 mEq/L (ref 137–147)
Total Protein: 7.2 g/dL (ref 6.0–8.3)

## 2013-02-06 LAB — CBC WITH DIFFERENTIAL/PLATELET
Basophils Absolute: 0 10*3/uL (ref 0.0–0.1)
Basophils Relative: 0 % (ref 0–1)
Eosinophils Absolute: 0.1 10*3/uL (ref 0.0–0.7)
Eosinophils Relative: 1 % (ref 0–5)
HCT: 33.1 % — ABNORMAL LOW (ref 36.0–46.0)
HEMOGLOBIN: 11 g/dL — AB (ref 12.0–15.0)
LYMPHS ABS: 3.3 10*3/uL (ref 0.7–4.0)
Lymphocytes Relative: 27 % (ref 12–46)
MCH: 27.2 pg (ref 26.0–34.0)
MCHC: 33.2 g/dL (ref 30.0–36.0)
MCV: 81.7 fL (ref 78.0–100.0)
MONOS PCT: 8 % (ref 3–12)
Monocytes Absolute: 0.9 10*3/uL (ref 0.1–1.0)
NEUTROS PCT: 65 % (ref 43–77)
Neutro Abs: 7.8 10*3/uL — ABNORMAL HIGH (ref 1.7–7.7)
Platelets: 280 10*3/uL (ref 150–400)
RBC: 4.05 MIL/uL (ref 3.87–5.11)
RDW: 13.6 % (ref 11.5–15.5)
WBC: 12.1 10*3/uL — ABNORMAL HIGH (ref 4.0–10.5)

## 2013-02-06 LAB — RAPID URINE DRUG SCREEN, HOSP PERFORMED
Amphetamines: NOT DETECTED
BARBITURATES: NOT DETECTED
Benzodiazepines: NOT DETECTED
Cocaine: POSITIVE — AB
Opiates: NOT DETECTED
Tetrahydrocannabinol: POSITIVE — AB

## 2013-02-06 LAB — URINE MICROSCOPIC-ADD ON

## 2013-02-06 LAB — HEPARIN LEVEL (UNFRACTIONATED)
HEPARIN UNFRACTIONATED: 0.85 [IU]/mL — AB (ref 0.30–0.70)
HEPARIN UNFRACTIONATED: 0.6 [IU]/mL (ref 0.30–0.70)

## 2013-02-06 LAB — APTT: aPTT: 128 seconds — ABNORMAL HIGH (ref 24–37)

## 2013-02-06 LAB — LIPID PANEL
Cholesterol: 176 mg/dL (ref 0–200)
HDL: 49 mg/dL (ref >39)
LDL CALC: 122 mg/dL — AB (ref 0–99)
Total CHOL/HDL Ratio: 3.6 RATIO
Triglycerides: 27 mg/dL (ref <150)
VLDL: 5 mg/dL (ref 0–40)

## 2013-02-06 LAB — POCT I-STAT TROPONIN I: TROPONIN I, POC: 1.34 ng/mL — AB (ref 0.00–0.08)

## 2013-02-06 LAB — TROPONIN I
Troponin I: >20 ng/mL (ref <0.30)
Troponin I: 17.63 ng/mL (ref <0.30)

## 2013-02-06 LAB — TSH: TSH: 0.171 u[IU]/mL — ABNORMAL LOW (ref 0.350–4.500)

## 2013-02-06 LAB — HEMOGLOBIN A1C
Hgb A1c MFr Bld: 5.9 % — ABNORMAL HIGH (ref <5.7)
Mean Plasma Glucose: 123 mg/dL — ABNORMAL HIGH (ref <117)

## 2013-02-06 LAB — MRSA PCR SCREENING: MRSA BY PCR: NEGATIVE

## 2013-02-06 LAB — PROTIME-INR
INR: 1.13 (ref 0.00–1.49)
Prothrombin Time: 14.3 seconds (ref 11.6–15.2)

## 2013-02-06 MED ORDER — HEPARIN BOLUS VIA INFUSION
3500.0000 [IU] | Freq: Once | INTRAVENOUS | Status: AC
  Administered 2013-02-06: 03:00:00 3500 [IU] via INTRAVENOUS
  Filled 2013-02-06: qty 3500

## 2013-02-06 MED ORDER — HEPARIN (PORCINE) IN NACL 100-0.45 UNIT/ML-% IJ SOLN
750.0000 [IU]/h | INTRAMUSCULAR | Status: DC
  Administered 2013-02-06: 03:00:00 750 [IU]/h via INTRAVENOUS
  Filled 2013-02-06 (×3): qty 250

## 2013-02-06 MED ORDER — ATORVASTATIN CALCIUM 40 MG PO TABS
40.0000 mg | ORAL_TABLET | Freq: Every day | ORAL | Status: DC
  Administered 2013-02-06 – 2013-02-10 (×4): 40 mg via ORAL
  Filled 2013-02-06 (×6): qty 1

## 2013-02-06 MED ORDER — HEART ATTACK BOUNCING BOOK
Freq: Once | Status: AC
  Administered 2013-02-06: 14:00:00
  Filled 2013-02-06 (×2): qty 1

## 2013-02-06 MED ORDER — NITROGLYCERIN 0.4 MG SL SUBL
0.4000 mg | SUBLINGUAL_TABLET | SUBLINGUAL | Status: DC | PRN
  Administered 2013-02-10: 0.4 mg via SUBLINGUAL
  Filled 2013-02-06: qty 25

## 2013-02-06 MED ORDER — ACETAMINOPHEN 325 MG PO TABS
650.0000 mg | ORAL_TABLET | ORAL | Status: DC | PRN
  Administered 2013-02-06: 10:00:00 650 mg via ORAL
  Filled 2013-02-06 (×4): qty 2

## 2013-02-06 MED ORDER — NITROGLYCERIN IN D5W 200-5 MCG/ML-% IV SOLN
3.0000 ug/min | INTRAVENOUS | Status: DC
  Administered 2013-02-06: 04:00:00 10 ug/min via INTRAVENOUS
  Filled 2013-02-06: qty 250

## 2013-02-06 MED ORDER — POTASSIUM CHLORIDE CRYS ER 20 MEQ PO TBCR
40.0000 meq | EXTENDED_RELEASE_TABLET | Freq: Once | ORAL | Status: AC
  Administered 2013-02-06: 40 meq via ORAL
  Filled 2013-02-06: qty 2

## 2013-02-06 MED ORDER — ONDANSETRON HCL 4 MG/2ML IJ SOLN: 4.0000 mg | Freq: Four times a day (QID) | INTRAMUSCULAR | Status: DC | PRN

## 2013-02-06 MED ORDER — ACETAMINOPHEN-CODEINE #3 300-30 MG PO TABS
1.0000 | ORAL_TABLET | ORAL | Status: DC | PRN
  Administered 2013-02-06 – 2013-02-07 (×3): 2 via ORAL
  Filled 2013-02-06 (×3): qty 2

## 2013-02-06 MED ORDER — METOPROLOL TARTRATE 25 MG PO TABS
25.0000 mg | ORAL_TABLET | Freq: Two times a day (BID) | ORAL | Status: DC
  Administered 2013-02-06: 25 mg via ORAL
  Administered 2013-02-07: 12.5 mg via ORAL
  Administered 2013-02-07 – 2013-02-11 (×7): 25 mg via ORAL
  Filled 2013-02-06 (×13): qty 1

## 2013-02-06 MED ORDER — ASPIRIN EC 81 MG PO TBEC
81.0000 mg | DELAYED_RELEASE_TABLET | Freq: Every day | ORAL | Status: DC
  Administered 2013-02-07: 81 mg via ORAL
  Filled 2013-02-06: qty 1

## 2013-02-06 MED ORDER — LORATADINE 10 MG PO TABS
10.0000 mg | ORAL_TABLET | Freq: Every day | ORAL | Status: DC
  Administered 2013-02-06 – 2013-02-11 (×6): 10 mg via ORAL
  Filled 2013-02-06 (×6): qty 1

## 2013-02-06 NOTE — Progress Notes (Signed)
ANTICOAGULATION CONSULT NOTE - Initial Consult  Pharmacy Consult for Heparin  Indication: chest pain/ACS  Allergies  Allergen Reactions  . Demerol Itching  . Novocain [Procaine Hcl] Swelling   Patient Measurements: Height: 5\' 3"  (160 cm) Weight: 125 lb 14.1 oz (57.1 kg) IBW/kg (Calculated) : 52.4  Vital Signs: Temp: 98.6 F (37 C) (01/10 0224) Temp src: Oral (01/10 0224) BP: 142/81 mmHg (01/10 0224) Pulse Rate: 76 (01/10 0224)  Labs:  Recent Labs  02/05/13 2233 02/05/13 2238  HGB 12.0 12.9  HCT 34.5* 38.0  PLT 288  --   CREATININE  --  0.90    Estimated Creatinine Clearance: 64.6 ml/min (by C-G formula based on Cr of 0.9).   Medical History: Past Medical History  Diagnosis Date  . Abnormal Pap smear     Colpo (twice)  . Anemia   . Chlamydia     years ago  . Trichomonas     years ago  . Breast disorder     soreness in left breast   . Depression    Assessment: 48 y/o F to start heparin for NSTEMI. Troponin 1.34. Labs as above.   Goal of Therapy:  Heparin level 0.3-0.7 units/ml Monitor platelets by anticoagulation protocol: Yes   Plan:  -Heparin 3500 units BOLUS x 1 -Start heparin drip at 750 units/hr -1000 HL -Daily CBC/HL -Monitor for bleeding  Narda Bonds 02/06/2013,2:34 AM

## 2013-02-06 NOTE — Progress Notes (Signed)
Patient ID: [name] [name], female   DOB: April 29, 1965, 48 y.o.   MRN: [id number]     Primary cardiologist:  Subjective:      Objective:   Temp:  [97.5 F (36.4 C)-98.7 F (37.1 C)] 98.7 F (37.1 C) (01/10 0738) Pulse Rate:  [67-81] 67 (01/10 0738) Resp:  [14-20] 18 (01/10 0738) BP: (93-142)/(63-94) 93/63 mmHg (01/10 0738) SpO2:  [99 %-100 %] 100 % (01/10 0738) Weight:  [125 lb 14.1 oz (57.1 kg)] 125 lb 14.1 oz (57.1 kg) (01/10 0224) Last BM Date: 02/05/13  Filed Weights   02/06/13 0224  Weight: 125 lb 14.1 oz (57.1 kg)   No intake or output data in the 24 hours ending 02/06/13 0759  Telemetry:  Exam:  General:  HEENT:  Resp:  Cardiac:  GI:  MSK:  Neuro:   Psych  Lab Results:  Basic Metabolic Panel:  Recent Labs Lab 02/05/13 2238 02/06/13 0453  NA 142 144  K 3.3* 4.0  CL 106 107  CO2  --  26  GLUCOSE 120* 103*  BUN 6 6  CREATININE 0.90 0.85  CALCIUM  --  8.9    Liver Function Tests:  Recent Labs Lab 02/06/13 0453  AST 61*  ALT 12  ALKPHOS 76  BILITOT 0.3  PROT 7.2  ALBUMIN 3.4*    CBC:  Recent Labs Lab 02/05/13 2233 02/05/13 2238 02/06/13 0453  WBC 12.6*  --  12.1*  HGB 12.0 12.9 11.0*  HCT 34.5* 38.0 33.1*  MCV 81.6  --  81.7  PLT 288  --  280    Cardiac Enzymes:  Recent Labs Lab 02/06/13 0453  TROPONINI >20.00*    BNP: No results found for this basename: PROBNP,  in the last 8760 hours  Coagulation:  Recent Labs Lab 02/06/13 0453  INR 1.13    ECG:   Medications:   Scheduled Medications: . aspirin      . [START ON 02/07/2013] aspirin EC  81 mg Oral Daily  . atorvastatin  40 mg Oral q1800  . heart attack bouncing book   Does not apply Once  . loratadine  10 mg Oral Daily  . metoprolol tartrate  25 mg Oral BID     Infusions: . heparin 750 Units/hr (02/06/13 0317)  . nitroGLYCERIN 10 mcg/min (02/06/13 0423)     PRN Medications:  acetaminophen, nitroGLYCERIN, ondansetron (ZOFRAN)  IV     Assessment/Plan   48 yo female long time smoke with no prior cardiac history admitted with chest pain associated with nausea and shortness of breath. EKG showed mild TWI anteroseptal leads, troponin has trended up to greater than 20.  1. NSTEMI - signicantly elevated troponin, mild EKG changes in the anteroseptal leads. She is currently pain free - on heparin, metoprolol, atorvastatin, ASA. Low normal blood pressures, will hold off on ACE-I - echo this AM. Follow up Hgb A1c. Lipid panel shows LDL 122, initiated on statin in setting of ACS. - patient is pain free and hemodynamically stable, plan for cath Monday unless clinically changes.         Carlyle Dolly, M.D., F.A.C.C.

## 2013-02-06 NOTE — ED Notes (Signed)
Report to [facility] on [place].  Pt to floor on monitor with RN accompanying.

## 2013-02-06 NOTE — Progress Notes (Signed)
CARDIAC REHAB PHASE I   PRE:  Rate/Rhythm: 82 SR   BP:  Sitting: 93/63     SaO2: 100 RA  MODE:  Ambulation: 850 ft   POST:  Rate/Rhythm: 105 ST   BP:  Sitting: 97/54    SaO2:   Pt walked 850 ft with assist x1.  Pt had steady gait, with no c/o CP or SOB.  Returned pt back to bed  With call button and phone in reach.  Pt seemed very eager for lifestyle modification, smoking cessation and is very interested in CRP II in [place].    [phone] Lillia Dallas MS, ACSM RCEP 8:30 AM 02/06/2013

## 2013-02-06 NOTE — Progress Notes (Signed)
Notified Dr Harl Bowie of Trop > 20.0

## 2013-02-06 NOTE — Progress Notes (Signed)
Echo Lab  2D Echocardiogram completed.  [place], RDCS 02/06/2013 10:34 AM

## 2013-02-06 NOTE — Progress Notes (Signed)
ANTICOAGULATION CONSULT NOTE - Initial Consult  Pharmacy Consult for Heparin  Indication: chest pain/ACS  Allergies  Allergen Reactions  . Demerol Itching  . Novocain [Procaine Hcl] Swelling   Patient Measurements: Height: 5\' 3"  (160 cm) Weight: 125 lb 14.1 oz (57.1 kg) IBW/kg (Calculated) : 52.4  Vital Signs: Temp: 98.7 F (37.1 C) (01/10 0738) Temp src: Oral (01/10 0738) BP: 93/63 mmHg (01/10 0738) Pulse Rate: 67 (01/10 0738)  Labs:  Recent Labs  02/05/13 2233 02/05/13 2238 02/06/13 0453 02/06/13 0815 02/06/13 0930  HGB 12.0 12.9 11.0*  --   --   HCT 34.5* 38.0 33.1*  --   --   PLT 288  --  280  --   --   APTT  --   --  128*  --   --   LABPROT  --   --  14.3  --   --   INR  --   --  1.13  --   --   HEPARINUNFRC  --   --   --   --  0.85*  CREATININE  --  0.90 0.85  --   --   TROPONINI  --   --  >20.00* >20.00*  --     Estimated Creatinine Clearance: 68.4 ml/min (by C-G formula based on Cr of 0.85).   Medical History: Past Medical History  Diagnosis Date  . Abnormal Pap smear     Colpo (twice)  . Anemia   . Chlamydia     years ago  . Trichomonas     years ago  . Breast disorder     soreness in left breast   . Depression    Assessment: 48 y/o F to start heparin for NSTEMI. Troponin now >20  HL 01/10 0930 = 0.85, no bleeding noted   Goal of Therapy:  Heparin level 0.3-0.7 units/ml Monitor platelets by anticoagulation protocol: Yes   Plan:  - Reduce heparin drip to 600 units/hr - HL at 1700 - Daily CBC/HL - Monitor for bleeding  Ollen Gross B. Leitha Schuller, PharmD Clinical Pharmacist - Resident Pager: [phone] Phone: [phone] 02/06/2013 11:01 AM

## 2013-02-06 NOTE — ED Notes (Signed)
Attempted to call report; no answer after 20 rings

## 2013-02-06 NOTE — H&P (Addendum)
Admit date: 02/05/2013 Referring Physician Dr. Eulis Foster Primary Cardiologist None Chief complaint/reason for admission:NSTEMI  HPI: This is a 48yo AAF with no prior cardiac history but a long (28year) history of tobacco abuse who presented to the ER with complaints of chest pain.  She says that around 4pm while she was a child's diaper and suddenly had severe chest pain over the left breast which radiated down her left arm.  She became diaphoretic, nauseated and SOB.  She had some tingling in her lips and fingers.  She sat down and the pain persisted and she called EMS and they evaluated her and she was told it was an anxiety attack and she went home.  At home the CP became much worse and she started vomiting.  She called EMS again and was taken to the ER.  She was given ASA, morphine and Zofran.  She currently complains of a 2/10 CP.      PMH:    Past Medical History  Diagnosis Date  . Abnormal Pap smear     Colpo (twice)  . Anemia   . Chlamydia     years ago  . Trichomonas     years ago  . Breast disorder     soreness in left breast   . Depression     PSH:    Past Surgical History  Procedure Laterality Date  . Cesarean section      2 c-sections  . Breast surgery      lump removed from right breast  . Nova sure    . Groin mass open biopsy    . Cervical cone biopsy      ALLERGIES:   Demerol and Novocain  Prior to Admit Meds:   (Not in a hospital admission) Family HX:    Family History  Problem Relation Age of Onset  . Cancer Mother     breast  . Hypertension Brother   . Kidney failure Brother   . Hypertension Sister    Social HX:    History   Social History  . Marital Status: Single    Spouse Name: N/A    Number of Children: N/A  . Years of Education: N/A   Occupational History  . Not on file.   Social History Main Topics  . Smoking status: Current Every Day Smoker -- 0.50 packs/day for 28 years    Types: Cigarettes  . Smokeless tobacco: Never Used  .  Alcohol Use: Yes     Comment: rarely  . Drug Use: No  . Sexual Activity: Yes    Birth Control/ Protection: None   Other Topics Concern  . Not on file   Social History Narrative  . No narrative on file     ROS:  All 11 ROS were addressed and are negative except what is stated in the HPI  PHYSICAL EXAM Filed Vitals:   02/05/13 2315  BP: 121/75  Pulse: 79  Temp: 97.5 F (36.4 C)  Resp: 22   General: Well developed, well nourished, in no acute distress Head: Eyes PERRLA, No xanthomas.   Normal cephalic and atramatic  Lungs:   Clear bilaterally to auscultation and percussion. Heart:   HRRR S1 S2 Pulses are 2+ & equal.            No carotid bruit. No JVD.  No abdominal bruits. No femoral bruits. Abdomen: Bowel sounds are positive, abdomen soft and non-tender without masses  Extremities:   No clubbing, cyanosis or edema.  DP +  1 Neuro: Alert and oriented X 3. Psych:  Good affect, responds appropriately   Labs:   Lab Results  Component Value Date   WBC 12.6* 02/05/2013   HGB 12.9 02/05/2013   HCT 38.0 02/05/2013   MCV 81.6 02/05/2013   PLT 288 02/05/2013    Recent Labs Lab 02/05/13 2238  NA 142  K 3.3*  CL 106  BUN 6  CREATININE 0.90  GLUCOSE 120*          Radiology:  pending  EKG:  NSR with T wave inversion in V1 and V3  ASSESSMENT:  1.  NSTEMI currently with 2/10 CP and nonspecific T wave changes in V1 and V3 2.  Tobacco abuse 3.  Hypokalemia  PLAN:   1.  Admit to stepdown unit 2.  Cycle cardiac enzymes until they peak 3.  IV Heparin gtt 4.  IV NTG gtt 5.  Lopressor 25mg  BID 6.  ASA daily 7.  2D echo in am assess LVF 8.  Plan cardiac cath on Monday or earlier if her CP cannot be relieved with above measures or reoccurs after being pain free 9. Replete potassium 10.  Check HbA1C 11.  Tobacco cessation counselling  Sueanne Margarita, MD  02/06/2013  12:25 AM

## 2013-02-06 NOTE — Progress Notes (Signed)
ANTICOAGULATION CONSULT NOTE - Follow Up Consult  Pharmacy Consult for heparin Indication: chest pain/ACS  Allergies  Allergen Reactions  . Demerol Itching  . Novocain [Procaine Hcl] Swelling    Patient Measurements: Height: 5\' 3"  (160 cm) Weight: 125 lb 14.1 oz (57.1 kg) IBW/kg (Calculated) : 52.4  Vital Signs: Temp: 98.7 F (37.1 C) (01/10 1538) Temp src: Oral (01/10 1538) BP: 87/53 mmHg (01/10 1220) Pulse Rate: 67 (01/10 0738)  Labs:  Recent Labs  02/05/13 2233 02/05/13 2238 02/06/13 0453 02/06/13 0815 02/06/13 0930 02/06/13 1500 02/06/13 1702  HGB 12.0 12.9 11.0*  --   --   --   --   HCT 34.5* 38.0 33.1*  --   --   --   --   PLT 288  --  280  --   --   --   --   APTT  --   --  128*  --   --   --   --   LABPROT  --   --  14.3  --   --   --   --   INR  --   --  1.13  --   --   --   --   HEPARINUNFRC  --   --   --   --  0.85*  --  0.60  CREATININE  --  0.90 0.85  --   --   --   --   TROPONINI  --   --  >20.00* >20.00*  --  17.63*  --     Estimated Creatinine Clearance: 68.4 ml/min (by C-G formula based on Cr of 0.85).   Medications:  Scheduled:  . [START ON 02/07/2013] aspirin EC  81 mg Oral Daily  . atorvastatin  40 mg Oral q1800  . heart attack bouncing book   Does not apply Once  . loratadine  10 mg Oral Daily  . metoprolol tartrate  25 mg Oral BID   Infusions:  . heparin 600 Units/hr (02/06/13 1127)  . nitroGLYCERIN 10 mcg/min (02/06/13 0423)    Assessment: 48 yo female on heparin for NSTEMI and heparin now at goal (HL= 0.6) after reduction to 600 units/hr.  Patient noted for cath on Monday.  Goal of Therapy:  Heparin level 0.3-0.7 units/ml Monitor platelets by anticoagulation protocol: Yes   Plan:  -No heparin changes needed -Heparin level and CBC in am  Hildred Laser, Pharm D 02/06/2013 5:43 PM

## 2013-02-06 NOTE — ED Notes (Signed)
Pt reported she has had chest pain in left breast area radiating to left shoulder that began 1/9.

## 2013-02-07 DIAGNOSIS — I214 Non-ST elevation (NSTEMI) myocardial infarction: Secondary | ICD-10-CM

## 2013-02-07 DIAGNOSIS — E079 Disorder of thyroid, unspecified: Secondary | ICD-10-CM

## 2013-02-07 DIAGNOSIS — F17200 Nicotine dependence, unspecified, uncomplicated: Secondary | ICD-10-CM

## 2013-02-07 DIAGNOSIS — F1410 Cocaine abuse, uncomplicated: Secondary | ICD-10-CM

## 2013-02-07 LAB — HEPARIN LEVEL (UNFRACTIONATED)
Heparin Unfractionated: 0.34 IU/mL (ref 0.30–0.70)
Heparin Unfractionated: 0.14 IU/mL — ABNORMAL LOW (ref 0.30–0.70)

## 2013-02-07 MED ORDER — NICOTINE 14 MG/24HR TD PT24
14.0000 mg | MEDICATED_PATCH | Freq: Every day | TRANSDERMAL | Status: DC
  Administered 2013-02-07 – 2013-02-11 (×5): 14 mg via TRANSDERMAL
  Filled 2013-02-07 (×5): qty 1

## 2013-02-07 MED ORDER — HEPARIN BOLUS VIA INFUSION
1500.0000 [IU] | Freq: Once | INTRAVENOUS | Status: AC
  Administered 2013-02-07: 1500 [IU] via INTRAVENOUS
  Filled 2013-02-07: qty 1500

## 2013-02-07 MED ORDER — SODIUM CHLORIDE 0.9 % IV SOLN: 250.0000 mL | INTRAVENOUS | Status: DC | PRN

## 2013-02-07 MED ORDER — SODIUM CHLORIDE 0.9 % IV SOLN
INTRAVENOUS | Status: DC | PRN
  Administered 2013-02-06: 20:00:00 via INTRAVENOUS

## 2013-02-07 MED ORDER — SODIUM CHLORIDE 0.9 % IJ SOLN: 3.0000 mL | INTRAMUSCULAR | Status: DC | PRN

## 2013-02-07 NOTE — Progress Notes (Addendum)
SUBJECTIVE: The patient is doing well today.  At this time, she denies chest pain, shortness of breath, or any new concerns.  Marland Kitchen aspirin EC  81 mg Oral Daily  . atorvastatin  40 mg Oral q1800  . loratadine  10 mg Oral Daily  . metoprolol tartrate  25 mg Oral BID  . nicotine  14 mg Transdermal Daily   . sodium chloride 10 mL/hr at 02/06/13 2000  . heparin 600 Units/hr (02/06/13 2000)  . nitroGLYCERIN 5 mcg/min (02/06/13 2000)    OBJECTIVE: Physical Exam: Filed Vitals:   02/07/13 0007 02/07/13 0357 02/07/13 0400 02/07/13 0831  BP:   85/60 93/62  Pulse:      Temp: 98.4 F (36.9 C) 97.9 F (36.6 C)  98.6 F (37 C)  TempSrc: Oral Oral  Oral  Resp:   18   Height:      Weight:      SpO2: 99%  99% 99%    Intake/Output Summary (Last 24 hours) at 02/07/13 1149 Last data filed at 02/07/13 0700  Gross per 24 hour  Intake  677.5 ml  Output    150 ml  Net  527.5 ml    Telemetry reveals sinus rhythm  GEN- The patient is well appearing, alert and oriented x 3 today.   Head- normocephalic, atraumatic Eyes-  Sclera clear, conjunctiva pink Ears- hearing intact Oropharynx- clear Neck- supple, no JVP Lymph- no cervical lymphadenopathy Lungs- Clear to ausculation bilaterally, normal work of breathing Heart- Regular rate and rhythm, no murmurs, rubs or gallops, PMI not laterally displaced GI- soft, NT, ND, + BS Extremities- no clubbing, cyanosis, or edema Skin- no rash or lesion Psych- euthymic mood, full affect Neuro- strength and sensation are intact  LABS: Basic Metabolic Panel:  Recent Labs  02/05/13 2238 02/06/13 0453  NA 142 144  K 3.3* 4.0  CL 106 107  CO2  --  26  GLUCOSE 120* 103*  BUN 6 6  CREATININE 0.90 0.85  CALCIUM  --  8.9   Liver Function Tests:  Recent Labs  02/06/13 0453  AST 61*  ALT 12  ALKPHOS 76  BILITOT 0.3  PROT 7.2  ALBUMIN 3.4*   No results found for this basename: LIPASE, AMYLASE,  in the last 72 hours CBC:  Recent Labs  02/05/13 2233 02/05/13 2238 02/06/13 0453  WBC 12.6*  --  12.1*  NEUTROABS  --   --  7.8*  HGB 12.0 12.9 11.0*  HCT 34.5* 38.0 33.1*  MCV 81.6  --  81.7  PLT 288  --  280   Cardiac Enzymes:  Recent Labs  02/06/13 0453 02/06/13 0815 02/06/13 1500  TROPONINI >20.00* >20.00* 17.63*   BNP: No components found with this basename: POCBNP,  D-Dimer: No results found for this basename: DDIMER,  in the last 72 hours Hemoglobin A1C:  Recent Labs  02/06/13 0453  HGBA1C 5.9*   Fasting Lipid Panel:  Recent Labs  02/06/13 0453  CHOL 176  HDL 49  LDLCALC 122*  TRIG 27  CHOLHDL 3.6   Thyroid Function Tests:  Recent Labs  02/06/13 0453  TSH 0.171*   Anemia Panel: No results found for this basename: VITAMINB12, FOLATE, FERRITIN, TIBC, IRON, RETICCTPCT,  in the last 72 hours  RADIOLOGY: Dg Chest Port 1 View  02/05/2013   CLINICAL DATA:  Chest pain and shortness of breath.  EXAM: PORTABLE CHEST - 1 VIEW  COMPARISON:  PA and lateral chest 08/08/2004.  FINDINGS: Lungs are clear.  Heart size is normal. No pneumothorax or pleural fluid.  IMPRESSION: Negative chest.   Electronically Signed   By: Inge Rise M.D.   On: 02/05/2013 23:58   EKG today shows new inferior TWI when compared to EKG yesterday   ASSESSMENT AND PLAN:  Active Problems:   NSTEMI (non-ST elevated myocardial infarction)  1. NSTEMI Clinically stable presently, chest pain is resolved Risks, benefits, and alternatives to [facility] with possible PCI were discussed at length with the aptient who wishes to proceed.  Anticipate cath tomorrow Continue heparin and nitro for now  2. Tobacco use Cessation advised Will start nicotine patch  3. Polysubstance abuse She reports ongoing drug abuse.  Cocaine positive on admit.  Cessation is strongly advised  4. Thyroid dysfunction T4 is ordered   Thompson Grayer, MD 02/07/2013 11:49 AM

## 2013-02-07 NOTE — Progress Notes (Signed)
ANTICOAGULATION CONSULT NOTE - Follow Up Consult  Pharmacy Consult for heparin Indication: chest pain/ACS  Allergies  Allergen Reactions  . Demerol Itching  . Novocain [Procaine Hcl] Swelling    Patient Measurements: Height: 5\' 3"  (160 cm) Weight: 125 lb 14.1 oz (57.1 kg) IBW/kg (Calculated) : 52.4  Vital Signs: Temp: 98.6 F (37 C) (01/11 1718) Temp src: Oral (01/11 1718) BP: 82/51 mmHg (01/11 1718)  Labs:  Recent Labs  02/05/13 2233 02/05/13 2238 02/06/13 0453 02/06/13 0815  02/06/13 1500 02/06/13 1702 02/07/13 1107 02/07/13 1800  HGB 12.0 12.9 11.0*  --   --   --   --   --   --   HCT 34.5* 38.0 33.1*  --   --   --   --   --   --   PLT 288  --  280  --   --   --   --   --   --   APTT  --   --  128*  --   --   --   --   --   --   LABPROT  --   --  14.3  --   --   --   --   --   --   INR  --   --  1.13  --   --   --   --   --   --   HEPARINUNFRC  --   --   --   --   < >  --  0.60 0.34 0.14*  CREATININE  --  0.90 0.85  --   --   --   --   --   --   TROPONINI  --   --  >20.00* >20.00*  --  17.63*  --   --   --   < > = values in this interval not displayed.  Estimated Creatinine Clearance: 68.4 ml/min (by C-G formula based on Cr of 0.85).   Medications:  Scheduled:  . aspirin EC  81 mg Oral Daily  . atorvastatin  40 mg Oral q1800  . loratadine  10 mg Oral Daily  . metoprolol tartrate  25 mg Oral BID  . nicotine  14 mg Transdermal Daily   Infusions:  . sodium chloride 10 mL/hr at 02/06/13 2000  . heparin 600 Units/hr (02/06/13 2000)  . nitroGLYCERIN 5 mcg/min (02/06/13 2000)    Assessment: 48 yo female on heparin for NSTEMI. Pharmacy was consulted to dose heparin. Heparin now running at 500 units/hr and heparin level has trended down to 0.14 and is now subtherapeutic.No new labs today other than heparin level. On 1/10, Hgb was 11 and platelets 280. No bleeding reported. Patient noted for cath on Monday.  Goal of Therapy:  Heparin level 0.3-0.7  units/ml Monitor platelets by anticoagulation protocol: Yes   Plan:  - Heparin 1500 units bolus x 1  - Increase heparin drip to 750 units/hr - Obtain HL at 0200  - Monitor daily CBC/HL and for S/S bleeding  - Cath planned for 1/12  Albertina Parr, PharmD.  Clinical Pharmacist Pager [phone]

## 2013-02-07 NOTE — Progress Notes (Signed)
ANTICOAGULATION CONSULT NOTE - Follow Up Consult  Pharmacy Consult for heparin Indication: chest pain/ACS  Allergies  Allergen Reactions  . Demerol Itching  . Novocain [Procaine Hcl] Swelling    Patient Measurements: Height: 5\' 3"  (160 cm) Weight: 125 lb 14.1 oz (57.1 kg) IBW/kg (Calculated) : 52.4  Vital Signs: Temp: 98.7 F (37.1 C) (01/11 1250) Temp src: Oral (01/11 1250) BP: 112/66 mmHg (01/11 1250)  Labs:  Recent Labs  02/05/13 2233 02/05/13 2238 02/06/13 0453 02/06/13 0815 02/06/13 0930 02/06/13 1500 02/06/13 1702 02/07/13 1107  HGB 12.0 12.9 11.0*  --   --   --   --   --   HCT 34.5* 38.0 33.1*  --   --   --   --   --   PLT 288  --  280  --   --   --   --   --   APTT  --   --  128*  --   --   --   --   --   LABPROT  --   --  14.3  --   --   --   --   --   INR  --   --  1.13  --   --   --   --   --   HEPARINUNFRC  --   --   --   --  0.85*  --  0.60 0.34  CREATININE  --  0.90 0.85  --   --   --   --   --   TROPONINI  --   --  >20.00* >20.00*  --  17.63*  --   --     Estimated Creatinine Clearance: 68.4 ml/min (by C-G formula based on Cr of 0.85).   Medications:  Scheduled:  . aspirin EC  81 mg Oral Daily  . atorvastatin  40 mg Oral q1800  . loratadine  10 mg Oral Daily  . metoprolol tartrate  25 mg Oral BID  . nicotine  14 mg Transdermal Daily   Infusions:  . sodium chloride 10 mL/hr at 02/06/13 2000  . heparin 600 Units/hr (02/06/13 2000)  . nitroGLYCERIN 5 mcg/min (02/06/13 2000)    Assessment: 48 yo female on heparin for NSTEMI. Pharmacy was consulted to dose heparin. Heparin level is therapeutic, but it decreased from 0.60 to 0.34. Nurse reported the heparin drip was not stopped. No new labs today other than heparin level. On 1/10, Hgb was 11 and platelets 280. No bleeding reported. Patient noted for cath on Monday.  Goal of Therapy:  Heparin level 0.3-0.7 units/ml Monitor platelets by anticoagulation protocol: Yes   Plan:  - Continue  heparin drip at 600 units/hr - Obtain HL at 1800 to verify level since level has dropped significantly - Monitor daily CBC/HL and for S/S bleeding  - Cath planned for 1/12  Jamarie Mussa A. Pincus Badder, PharmD Clinical Pharmacist - Resident Pager: [phone] Pharmacy: [phone] 02/07/2013 2:28 PM

## 2013-02-08 ENCOUNTER — Encounter (HOSPITAL_COMMUNITY)
Admission: EM | Disposition: A | Discharge status: Home / Self Care | Payer: Self-pay | Source: Home / Self Care | Attending: Cardiology

## 2013-02-08 HISTORY — PX: LEFT HEART CATHETERIZATION WITH CORONARY ANGIOGRAM: SHX5451

## 2013-02-08 LAB — BASIC METABOLIC PANEL
BUN: 7 mg/dL (ref 6–23)
CHLORIDE: 106 meq/L (ref 96–112)
CO2: 26 mEq/L (ref 19–32)
Calcium: 8.6 mg/dL (ref 8.4–10.5)
Creatinine, Ser: 0.98 mg/dL (ref 0.50–1.10)
GFR calc Af Amer: 79 mL/min — ABNORMAL LOW (ref >90)
GFR calc non Af Amer: 68 mL/min — ABNORMAL LOW (ref >90)
GLUCOSE: 92 mg/dL (ref 70–99)
POTASSIUM: 3.7 meq/L (ref 3.7–5.3)
Sodium: 142 mEq/L (ref 137–147)

## 2013-02-08 LAB — CBC
HEMATOCRIT: 30 % — AB (ref 36.0–46.0)
HEMOGLOBIN: 10.3 g/dL — AB (ref 12.0–15.0)
MCH: 28.2 pg (ref 26.0–34.0)
MCHC: 34.3 g/dL (ref 30.0–36.0)
MCV: 82.2 fL (ref 78.0–100.0)
Platelets: 253 10*3/uL (ref 150–400)
RBC: 3.65 MIL/uL — AB (ref 3.87–5.11)
RDW: 13.9 % (ref 11.5–15.5)
WBC: 8.1 10*3/uL (ref 4.0–10.5)

## 2013-02-08 LAB — PREGNANCY, URINE: Preg Test, Ur: NEGATIVE

## 2013-02-08 LAB — HEPARIN LEVEL (UNFRACTIONATED): HEPARIN UNFRACTIONATED: 0.45 [IU]/mL (ref 0.30–0.70)

## 2013-02-08 LAB — T4, FREE: Free T4: 1.12 ng/dL (ref 0.80–1.80)

## 2013-02-08 SURGERY — LEFT HEART CATHETERIZATION WITH CORONARY ANGIOGRAM
Anesthesia: LOCAL

## 2013-02-08 MED ORDER — SODIUM CHLORIDE 0.9 % IV SOLN
INTRAVENOUS | Status: AC
  Administered 2013-02-08: 16:00:00 via INTRAVENOUS

## 2013-02-08 MED ORDER — LIDOCAINE HCL (PF) 1 % IJ SOLN
INTRAMUSCULAR | Status: AC
Start: 2013-02-08 — End: 2013-02-08
  Filled 2013-02-08: qty 30

## 2013-02-08 MED ORDER — MAGNESIUM HYDROXIDE 400 MG/5ML PO SUSP
30.0000 mL | Freq: Every day | ORAL | Status: DC | PRN
  Administered 2013-02-08 – 2013-02-09 (×2): 30 mL via ORAL
  Filled 2013-02-08 (×2): qty 30

## 2013-02-08 MED ORDER — MIDAZOLAM HCL 2 MG/2ML IJ SOLN
INTRAMUSCULAR | Status: AC
  Filled 2013-02-08: qty 2

## 2013-02-08 MED ORDER — ASPIRIN EC 325 MG PO TBEC
325.0000 mg | DELAYED_RELEASE_TABLET | Freq: Every day | ORAL | Status: DC
  Administered 2013-02-08 – 2013-02-11 (×4): 325 mg via ORAL
  Filled 2013-02-08 (×4): qty 1

## 2013-02-08 MED ORDER — FENTANYL CITRATE 0.05 MG/ML IJ SOLN
INTRAMUSCULAR | Status: AC
  Filled 2013-02-08: qty 2

## 2013-02-08 MED ORDER — OXYCODONE-ACETAMINOPHEN 5-325 MG PO TABS
1.0000 | ORAL_TABLET | ORAL | Status: DC | PRN
  Administered 2013-02-08 – 2013-02-11 (×3): 1 via ORAL
  Filled 2013-02-08 (×3): qty 1

## 2013-02-08 MED ORDER — SODIUM CHLORIDE 0.9 % IJ SOLN
3.0000 mL | Freq: Two times a day (BID) | INTRAMUSCULAR | Status: DC
  Administered 2013-02-08: 3 mL via INTRAVENOUS
  Administered 2013-02-09: 11:00:00 via INTRAVENOUS

## 2013-02-08 MED ORDER — CLOPIDOGREL BISULFATE 75 MG PO TABS
75.0000 mg | ORAL_TABLET | Freq: Every day | ORAL | Status: DC
  Administered 2013-02-09 – 2013-02-11 (×3): 75 mg via ORAL
  Filled 2013-02-08 (×3): qty 1

## 2013-02-08 MED ORDER — SODIUM CHLORIDE 0.9 % IJ SOLN: 3.0000 mL | INTRAMUSCULAR | Status: DC | PRN

## 2013-02-08 MED ORDER — HEPARIN (PORCINE) IN NACL 2-0.9 UNIT/ML-% IJ SOLN
INTRAMUSCULAR | Status: AC
  Filled 2013-02-08: qty 1500

## 2013-02-08 MED ORDER — CLOPIDOGREL BISULFATE 300 MG PO TABS
600.0000 mg | ORAL_TABLET | Freq: Once | ORAL | Status: AC
  Administered 2013-02-08: 600 mg via ORAL
  Filled 2013-02-08: qty 2

## 2013-02-08 MED ORDER — MIDAZOLAM HCL 2 MG/2ML IJ SOLN
INTRAMUSCULAR | Status: AC
Start: 2013-02-08 — End: 2013-02-08
  Filled 2013-02-08: qty 2

## 2013-02-08 MED ORDER — SODIUM CHLORIDE 0.9 % IV SOLN: 250.0000 mL | INTRAVENOUS | Status: DC | PRN

## 2013-02-08 MED ORDER — HEPARIN SODIUM (PORCINE) 1000 UNIT/ML IJ SOLN
INTRAMUSCULAR | Status: AC
  Filled 2013-02-08: qty 1

## 2013-02-08 MED ORDER — HEPARIN (PORCINE) IN NACL 100-0.45 UNIT/ML-% IJ SOLN
750.0000 [IU]/h | INTRAMUSCULAR | Status: DC
  Administered 2013-02-08 – 2013-02-10 (×2): 750 [IU]/h via INTRAVENOUS
  Filled 2013-02-08 (×5): qty 250

## 2013-02-08 MED ORDER — ASPIRIN 81 MG PO CHEW: 81.0000 mg | CHEWABLE_TABLET | ORAL | Status: DC

## 2013-02-08 MED ORDER — VERAPAMIL HCL 2.5 MG/ML IV SOLN
INTRAVENOUS | Status: AC
  Filled 2013-02-08: qty 2

## 2013-02-08 MED ORDER — NITROGLYCERIN 0.2 MG/ML ON CALL CATH LAB
INTRAVENOUS | Status: AC
  Filled 2013-02-08: qty 1

## 2013-02-08 NOTE — Interval H&P Note (Signed)
History and Physical Interval Note:  02/08/2013 10:51 AM  [name] [name]  has presented today for surgery, with the diagnosis of NSTEMI  The various methods of treatment have been discussed with the patient and family. After consideration of risks, benefits and other options for treatment, the patient has consented to  Procedure(s): LEFT HEART CATHETERIZATION WITH CORONARY ANGIOGRAM (N/A) as a surgical intervention .  The patient's history has been reviewed, patient examined, no change in status, stable for surgery.  I have reviewed the patient's chart and labs.  Questions were answered to the patient's satisfaction.   The pertinent clinical data has been reviewed. The patient has a cocaine associated ACS with significant elevation in troponin suggesting significant myocardial injury. Suspect either right coronary a circumflex obstruction with collaterals.  The procedure and risks including the possibility of stroke, death, myocardial infarction, emergency surgery, bleeding, kidney injury, among others were discussed in detail with the patient and accepted prior to receiving sedation.  Sinclair Grooms

## 2013-02-08 NOTE — Clinical Documentation Improvement (Signed)
A cause and effect relationship may not be assumed and must be documented by a provider. Please clarify the relationship, if any, between _NSTEMI_and _Cocaine positive on admission_.  Are the conditions:   Due to or associated with each other (i.e. Cocaine induced MI)   Unrelated to each other   Unable to determine   Thank you,  Posey Pronto, RN, BSN, [place] Documentation Improvement Specialist HIM department--[place] Office [phone]

## 2013-02-08 NOTE — CV Procedure (Signed)
     Left Heart Catheterization with Coronary Angiography  Report  [name] [name]  48 y.o.  female 10-Mar-1965  Procedure Date: 02/08/2013  Referring Physician: Jenkins Rouge, MD Primary Cardiologist:: Fransico Him, MD  INDICATIONS: Non-ST elevation acute coronary syndrome  PROCEDURE: 1. Left heart catheterization; 2. Coronary angiography; 3. Left ventriculography; 4. Intracoronary nitroglycerin  CONSENT:  The risks, benefits, and details of the procedure were explained in detail to the patient. Risks including death, stroke, heart attack, kidney injury, allergy, limb ischemia, bleeding and radiation injury were discussed.  The patient verbalized understanding and wanted to proceed.  Informed written consent was obtained.  PROCEDURE TECHNIQUE:  After Xylocaine anesthesia a 5 French slender sheath was placed in the left radial artery with a single anterior needle wall stick.  Coronary angiography was done using a 5 [place] preformed JL 3.5 cm and JR 4 cm catheters.  Left ventriculography was done using the JR 4 catheter and hand injection.   Mild radial artery and brachial artery spasm during catheter exchanges.  MEDICATIONS: 4 mg of IV Versed (given in 1 mg aliquots); fentanyl 100 mcg IV  CONTRAST:  Total of 80 cc.  COMPLICATIONS:  None   HEMODYNAMICS:  Aortic pressure 111/68 mmHg; LV pressure 113/8 mmHg; LVEDP 13 mm mercury   ANGIOGRAPHIC DATA:   The left main coronary artery is normal.  The left anterior descending artery is widely patent. There is minimal mid vessel plaque obstruction the vessel by 25%. LAD is transapical..  The left circumflex artery is widely patent. 3 small obtuse marginal branches arise from it. Branches are tortuous but not obstructed.  The ramus intermedius is widely patent and moderate in size..  The right coronary artery is dominant. The PDA ostium and proximal segment contains intraluminal thrombus. The vessel is otherwise widely patent. The thrombus is  nonocclusive.Marland Kitchen  LEFT VENTRICULOGRAM:  Left ventricular angiogram was done in the 30 RAO projection and revealed basal inferior wall mild to moderate hypokinesis. EF 65%.   IMPRESSIONS:  1. 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 Angiography demonstrates no evidence of hemodynamically significant obstruction. Thrombotic debris is still present and will require medical therapy.  2. Widely patent left main, LAD, circumflex, ramus intermedius, and right coronary other than as mentioned above.  3. Inferobasal mild to moderate hypokinesis.   RECOMMENDATION:  1. Resume heparin 2 hours after her the wrist band is removed, without bolus.  2. Load with Plavix and continue combination aspirin and Plavix for 12 months.  3. Okay to discontinue IV heparin in 36-48 hours with discharge at that time if no recurrent anginal complaints with physical activity.

## 2013-02-08 NOTE — Care Management Note (Addendum)
    Page 1 of 1   02/09/2013     10:46:59 AM   CARE MANAGEMENT NOTE 02/09/2013  Patient:  [name],[name]   Account Number:  [id number]  Date Initiated:  02/08/2013  Documentation initiated by:  Elissa Hefty  Subjective/Objective Assessment:   adm w mi     Action/Plan:   lives w fam   Anticipated DC Date:     Anticipated DC Plan:        [place] consult  [facility]  [facility]      Choice offered to / List presented to:             Status of service:   Medicare Important Message given?   (If response is "NO", the following Medicare IM given date fields will be blank) Date Medicare IM given:   Date Additional Medicare IM given:    Discharge Disposition:  HOME/SELF CARE  Per UR Regulation:  Reviewed for med. necessity/level of care/duration of stay  If discussed at [place] of Stay Meetings, dates discussed:    Comments:  02/09/13  1045a [name] [name] [name] Giannone rn,bsn spoke w pt. no ins. she would like ref to [facility] [facility]. emailed  and wellness clinic so they can call pt pt sched appt. gave pt match letter for 34 days of meds at disch.

## 2013-02-08 NOTE — Progress Notes (Signed)
[place] for heparin Indication: chest pain/ACS  Allergies  Allergen Reactions  . Demerol Itching  . Novocain [Procaine Hcl] Swelling    Patient Measurements: Height: 5\' 3"  (160 cm) Weight: 125 lb 14.1 oz (57.1 kg) IBW/kg (Calculated) : 52.4  Vital Signs: Temp: 98.1 F (36.7 C) (01/11 2345) Temp src: Oral (01/11 2345) BP: 86/52 mmHg (01/11 2345) Pulse Rate: 86 (01/11 2345)  Labs:  Recent Labs  02/05/13 2233 02/05/13 2238 02/06/13 0453 02/06/13 0815  02/06/13 1500  02/07/13 1107 02/07/13 1800 02/08/13 0200 02/08/13 0235  HGB 12.0 12.9 11.0*  --   --   --   --   --   --   --  10.3*  HCT 34.5* 38.0 33.1*  --   --   --   --   --   --   --  30.0*  PLT 288  --  280  --   --   --   --   --   --   --  253  APTT  --   --  128*  --   --   --   --   --   --   --   --   LABPROT  --   --  14.3  --   --   --   --   --   --   --   --   INR  --   --  1.13  --   --   --   --   --   --   --   --   HEPARINUNFRC  --   --   --   --   < >  --   < > 0.34 0.14* 0.45  --   CREATININE  --  0.90 0.85  --   --   --   --   --   --   --   --   TROPONINI  --   --  >20.00* >20.00*  --  17.63*  --   --   --   --   --   < > = values in this interval not displayed.  Estimated Creatinine Clearance: 68.4 ml/min (by C-G formula based on Cr of 0.85). Assessment: 48 yo female with NSTEMI for heparin  Goal of Therapy:  Heparin level 0.3-0.7 units/ml Monitor platelets by anticoagulation protocol: Yes   Plan:  Continue Heparin at current rate F/U after cath  Phillis Knack, PharmD, BCPS

## 2013-02-08 NOTE — Interval H&P Note (Signed)
Cath Lab Visit (complete for each Cath Lab visit)  Clinical Evaluation Leading to the Procedure:   ACS: yes  Non-ACS:    Anginal Classification: CCS III  Anti-ischemic medical therapy: Minimal Therapy (1 class of medications)  Non-Invasive Test Results: No non-invasive testing performed  Prior CABG: No previous CABG      History and Physical Interval Note:  02/08/2013 10:52 AM  [name] [name]  has presented today for surgery, with the diagnosis of NSTEMI  The various methods of treatment have been discussed with the patient and family. After consideration of risks, benefits and other options for treatment, the patient has consented to  Procedure(s): LEFT HEART CATHETERIZATION WITH CORONARY ANGIOGRAM (N/A) as a surgical intervention .  The patient's history has been reviewed, patient examined, no change in status, stable for surgery.  I have reviewed the patient's chart and labs.  Questions were answered to the patient's satisfaction.     Sinclair Grooms

## 2013-02-08 NOTE — Progress Notes (Signed)
0955 Noted pt for cath today. Will follow up after procedure done. Graylon Good RN BSN 02/08/2013 9:52 AM

## 2013-02-08 NOTE — Progress Notes (Signed)
Patient ID: [name] [name], female   DOB: 12/13/1965, 48 y.o.   MRN: [id number]   SUBJECTIVE: No complaints this am  Discussed smoking cessation with her   . aspirin EC  81 mg Oral Daily  . atorvastatin  40 mg Oral q1800  . loratadine  10 mg Oral Daily  . metoprolol tartrate  25 mg Oral BID  . nicotine  14 mg Transdermal Daily   . sodium chloride 10 mL/hr at 02/08/13 0600  . heparin 750 Units/hr (02/08/13 0600)  . nitroGLYCERIN 5 mcg/min (02/08/13 0600)    OBJECTIVE: Physical Exam: Filed Vitals:   02/07/13 2000 02/07/13 2100 02/07/13 2345 02/08/13 0400  BP: 117/75 102/68 86/52 81/47  Pulse:   86 74  Temp: 97.9 F (36.6 C)  98.1 F (36.7 C) 98.5 F (36.9 C)  TempSrc: Oral  Oral Oral  Resp: 18     Height:      Weight:      SpO2: 99%  98% 97%    Intake/Output Summary (Last 24 hours) at 02/08/13 0834 Last data filed at 02/08/13 0600  Gross per 24 hour  Intake   1698 ml  Output    400 ml  Net   1298 ml    Telemetry reveals sinus rhythm  GEN- The patient is well appearing, alert and oriented x 3 today.   Head- normocephalic, atraumatic Eyes-  Sclera clear, conjunctiva pink Ears- hearing intact Oropharynx- clear Neck- supple, no JVP Lymph- no cervical lymphadenopathy Lungs- Clear to ausculation bilaterally, normal work of breathing Heart- Regular rate and rhythm, no murmurs, rubs or gallops, PMI not laterally displaced GI- soft, NT, ND, + BS Extremities- no clubbing, cyanosis, or edema Skin- no rash or lesion Psych- euthymic mood, full affect Neuro- strength and sensation are intact  LABS: Basic Metabolic Panel:  Recent Labs  02/06/13 0453 02/08/13 0235  NA 144 142  K 4.0 3.7  CL 107 106  CO2 26 26  GLUCOSE 103* 92  BUN 6 7  CREATININE 0.85 0.98  CALCIUM 8.9 8.6   Liver Function Tests:  Recent Labs  02/06/13 0453  AST 61*  ALT 12  ALKPHOS 76  BILITOT 0.3  PROT 7.2  ALBUMIN 3.4*   CBC:  Recent Labs  02/05/13 2238 02/06/13 0453  02/08/13 0235  WBC  --  12.1* 8.1  NEUTROABS  --  7.8*  --   HGB 12.9 11.0* 10.3*  HCT 38.0 33.1* 30.0*  MCV  --  81.7 82.2  PLT  --  280 253   Cardiac Enzymes:  Recent Labs  02/06/13 0453 02/06/13 0815 02/06/13 1500  TROPONINI >20.00* >20.00* 17.63*   Hemoglobin A1C:  Recent Labs  02/06/13 0453  HGBA1C 5.9*   Fasting Lipid Panel:  Recent Labs  02/06/13 0453  CHOL 176  HDL 49  LDLCALC 122*  TRIG 27  CHOLHDL 3.6   Thyroid Function Tests:  Recent Labs  02/06/13 0453  TSH 0.171*    RADIOLOGY: Dg Chest Port 1 View  02/05/2013   CLINICAL DATA:  Chest pain and shortness of breath.  EXAM: PORTABLE CHEST - 1 VIEW  COMPARISON:  PA and lateral chest 08/08/2004.  FINDINGS: Lungs are clear. Heart size is normal. No pneumothorax or pleural fluid.  IMPRESSION: Negative chest.   Electronically Signed   By: Thomas  Dalessio M.D.   On: 02/05/2013 23:58   EKG 1/11  shows new inferior TWI when compared to EKG yesterday   ASSESSMENT AND PLAN:  Active Problems:     NSTEMI (non-ST elevated myocardial infarction)  1. NSTEMI Clinically stable presently, chest pain is resolved Risks, benefits, and alternatives to [facility] with possible PCI were discussed at length with the aptient who wishes to proceed.  Urine pregnancy test has not been sent to lab  Asked nurse Lavella Lemons to do ASAP  Patient denies possibility of being pregnant Continue heparin and nitro for now  2. Tobacco use Cessation advised Will start nicotine patch  3. Polysubstance abuse She reports ongoing drug abuse.  Cocaine positive on admit.  Cessation is strongly advised She was placed on beta blocker by admitting physician   4. Thyroid dysfunction T4 is ordered   Jenkins Rouge, MD 02/08/2013 8:34 AM

## 2013-02-08 NOTE — Progress Notes (Signed)
[place] for heparin Indication: chest pain/ACS  Allergies  Allergen Reactions  . Demerol Itching  . Novocain [Procaine Hcl] Swelling    Patient Measurements: Height: 5\' 3"  (160 cm) Weight: 125 lb 14.1 oz (57.1 kg) IBW/kg (Calculated) : 52.4  Vital Signs: Temp: 98.7 F (37.1 C) (01/12 1221) Temp src: Oral (01/12 1221) BP: 113/77 mmHg (01/12 1221) Pulse Rate: 78 (01/12 1217)  Labs:  Recent Labs  02/05/13 2233 02/05/13 2238 02/06/13 0453 02/06/13 0815  02/06/13 1500  02/07/13 1107 02/07/13 1800 02/08/13 0200 02/08/13 0235  HGB 12.0 12.9 11.0*  --   --   --   --   --   --   --  10.3*  HCT 34.5* 38.0 33.1*  --   --   --   --   --   --   --  30.0*  PLT 288  --  280  --   --   --   --   --   --   --  253  APTT  --   --  128*  --   --   --   --   --   --   --   --   LABPROT  --   --  14.3  --   --   --   --   --   --   --   --   INR  --   --  1.13  --   --   --   --   --   --   --   --   HEPARINUNFRC  --   --   --   --   < >  --   < > 0.34 0.14* 0.45  --   CREATININE  --  0.90 0.85  --   --   --   --   --   --   --  0.98  TROPONINI  --   --  >20.00* >20.00*  --  17.63*  --   --   --   --   --   < > = values in this interval not displayed.  Estimated Creatinine Clearance: 59.3 ml/min (by C-G formula based on Cr of 0.98).  Assessment: 48 yo female with NSTEMI likely caused by posterior descending artery plaque rupture with thrombus formation. Patient was on heparin prior to cath and will restart this afternoon 2 hours post wrist band removal. Also received orders to load patient with plavix and continue asa/plavix x 12 months.  Goal of Therapy:  Heparin level 0.3-0.7 units/ml Monitor platelets by anticoagulation protocol: Yes   Plan:  Restart heparin at previous rate of 750 units/hr Daily CBC/HL while on heparin  Erin Hearing PharmD., BCPS Clinical Pharmacist Pager [phone] 02/08/2013 1:00 PM

## 2013-02-08 NOTE — H&P (View-Only) (Signed)
Patient ID: [name] [name], female   DOB: 1965/04/05, 48 y.o.   MRN: [id number]   SUBJECTIVE: No complaints this am  Discussed smoking cessation with her   . aspirin EC  81 mg Oral Daily  . atorvastatin  40 mg Oral q1800  . loratadine  10 mg Oral Daily  . metoprolol tartrate  25 mg Oral BID  . nicotine  14 mg Transdermal Daily   . sodium chloride 10 mL/hr at 02/08/13 0600  . heparin 750 Units/hr (02/08/13 0600)  . nitroGLYCERIN 5 mcg/min (02/08/13 0600)    OBJECTIVE: Physical Exam: Filed Vitals:   02/07/13 2000 02/07/13 2100 02/07/13 2345 02/08/13 0400  BP: 117/75 102/68 86/52 81/47   Pulse:   86 74  Temp: 97.9 F (36.6 C)  98.1 F (36.7 C) 98.5 F (36.9 C)  TempSrc: Oral  Oral Oral  Resp: 18     Height:      Weight:      SpO2: 99%  98% 97%    Intake/Output Summary (Last 24 hours) at 02/08/13 0834 Last data filed at 02/08/13 0600  Gross per 24 hour  Intake   1698 ml  Output    400 ml  Net   1298 ml    Telemetry reveals sinus rhythm  GEN- The patient is well appearing, alert and oriented x 3 today.   Head- normocephalic, atraumatic Eyes-  Sclera clear, conjunctiva pink Ears- hearing intact Oropharynx- clear Neck- supple, no JVP Lymph- no cervical lymphadenopathy Lungs- Clear to ausculation bilaterally, normal work of breathing Heart- Regular rate and rhythm, no murmurs, rubs or gallops, PMI not laterally displaced GI- soft, NT, ND, + BS Extremities- no clubbing, cyanosis, or edema Skin- no rash or lesion Psych- euthymic mood, full affect Neuro- strength and sensation are intact  LABS: Basic Metabolic Panel:  Recent Labs  02/06/13 0453 02/08/13 0235  NA 144 142  K 4.0 3.7  CL 107 106  CO2 26 26  GLUCOSE 103* 92  BUN 6 7  CREATININE 0.85 0.98  CALCIUM 8.9 8.6   Liver Function Tests:  Recent Labs  02/06/13 0453  AST 61*  ALT 12  ALKPHOS 76  BILITOT 0.3  PROT 7.2  ALBUMIN 3.4*   CBC:  Recent Labs  02/05/13 2238 02/06/13 0453  02/08/13 0235  WBC  --  12.1* 8.1  NEUTROABS  --  7.8*  --   HGB 12.9 11.0* 10.3*  HCT 38.0 33.1* 30.0*  MCV  --  81.7 82.2  PLT  --  280 253   Cardiac Enzymes:  Recent Labs  02/06/13 0453 02/06/13 0815 02/06/13 1500  TROPONINI >20.00* >20.00* 17.63*   Hemoglobin A1C:  Recent Labs  02/06/13 0453  HGBA1C 5.9*   Fasting Lipid Panel:  Recent Labs  02/06/13 0453  CHOL 176  HDL 49  LDLCALC 122*  TRIG 27  CHOLHDL 3.6   Thyroid Function Tests:  Recent Labs  02/06/13 0453  TSH 0.171*    RADIOLOGY: Dg Chest Port 1 View  02/05/2013   CLINICAL DATA:  Chest pain and shortness of breath.  EXAM: PORTABLE CHEST - 1 VIEW  COMPARISON:  PA and lateral chest 08/08/2004.  FINDINGS: Lungs are clear. Heart size is normal. No pneumothorax or pleural fluid.  IMPRESSION: Negative chest.   Electronically Signed   By: Inge Rise M.D.   On: 02/05/2013 23:58   EKG 1/11  shows new inferior TWI when compared to EKG yesterday   ASSESSMENT AND PLAN:  Active Problems:  NSTEMI (non-ST elevated myocardial infarction)  1. NSTEMI Clinically stable presently, chest pain is resolved Risks, benefits, and alternatives to [facility] with possible PCI were discussed at length with the aptient who wishes to proceed.  Urine pregnancy test has not been sent to lab  Asked nurse Lavella Lemons to do ASAP  Patient denies possibility of being pregnant Continue heparin and nitro for now  2. Tobacco use Cessation advised Will start nicotine patch  3. Polysubstance abuse She reports ongoing drug abuse.  Cocaine positive on admit.  Cessation is strongly advised She was placed on beta blocker by admitting physician   4. Thyroid dysfunction T4 is ordered   Jenkins Rouge, MD 02/08/2013 8:34 AM

## 2013-02-09 LAB — CBC
HCT: 34.7 % — ABNORMAL LOW (ref 36.0–46.0)
Hemoglobin: 11.4 g/dL — ABNORMAL LOW (ref 12.0–15.0)
MCH: 27.4 pg (ref 26.0–34.0)
MCHC: 32.9 g/dL (ref 30.0–36.0)
MCV: 83.4 fL (ref 78.0–100.0)
PLATELETS: 282 10*3/uL (ref 150–400)
RBC: 4.16 MIL/uL (ref 3.87–5.11)
RDW: 14 % (ref 11.5–15.5)
WBC: 7.4 10*3/uL (ref 4.0–10.5)

## 2013-02-09 LAB — OCCULT BLOOD X 1 CARD TO LAB, STOOL: Fecal Occult Bld: NEGATIVE

## 2013-02-09 LAB — HEPARIN LEVEL (UNFRACTIONATED): Heparin Unfractionated: 0.32 IU/mL (ref 0.30–0.70)

## 2013-02-09 NOTE — Progress Notes (Addendum)
Clinical Social Work Department BRIEF PSYCHOSOCIAL ASSESSMENT 02/09/2013  Patient:  [name] [name],[name] [name]     Account Number:  [id number]     Admit date:  02/05/2013  Clinical Social Worker:  Freeman Caldron  Date/Time:  02/09/2013 10:47 AM  Referred by:  Physician  Date Referred:  02/09/2013 Referred for  Substance Abuse   Other Referral:   Interview type:  Patient Other interview type:    PSYCHOSOCIAL DATA Living Status:  ALONE Admitted from facility:   Level of care:   Primary support name:  Marga Melnick [phone]) Primary support relationship to patient:  SIBLING Degree of support available:   Good--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    CURRENT CONCERNS Current Concerns  Substance Abuse   Other Concerns:    SOCIAL WORK ASSESSMENT / PLAN CSW explained that RN made the referral at pt's request to discuss options for substance use treatment. Pt explained that she realizes that she needs to stop using drugs, specifically that she uses cocaine. CSW asked pt how often she uses cocaine, and pt states she uses it infrequently and had stopped for a period of time but when the holidays came around she began using again. Pt explained that she was on Selexa for depression but that she lost her job and her insurance and was no longer able to obtain the medication, so she started using cocaine to self-medicate. CSW asked pt what her motivation for quitting is, particularly as pt is describing benefits from using cocaine. Pt explained that she realizes the dangers of using drugs, given her recent heart attack. Pt has an 46 year-old grandson, [place], and she says she is motivated to stop using because she wants to be around for him. CSW supported this and provided NA meeting schedule for [facility], a 24-hour crisis line and information about the [employer], which  has a substance abuse RN and community programs to assist with substance abuse. Pt accepted resources and thanked CSW, saying she feels that she will be able to stop using drugs because her health is important to her. Pt works for the day care that her sister runs, and asked CSW about medication assistance. CSW explained that the [employer] has information about community assisstance programs like this, and that CSW will ask RNCM to speak with pt about this. RNCM has been informed. Pt also asked about Medicaid application and explained that she thought a Education officer, museum in the hospital could help with this. CSW explained that financial counseling can help with this, and CSW has called counselor and left a voicemail asking her to see pt to start Medicaid application.    Assessment/plan status:  No further intervention required Other assessment/ plan:   Information/referral to community resources:   NA meeting schedule, 24-hour crisis line number for substance use, and information about [employer] provided.    PATIENT'S/FAMILY'S RESPONSE TO PLAN OF CARE: Good--pt engaged in open and honest conversation with CSW about drug use, specifically cocaine. Pt states she is highly motivated to change and was sincere in her responses to CSW questions. Pt expressed gratitude for resources, though she anticipates that she will be able to stop using cocaine independently because she wants to be healthy and alive for her grandson and other family members. CSW provided brief supportive counseling and interventions from brief substance abuse counseling, utilizing active listening, paraphrasing, and reflective listening techniques. No further CSW  needs identified, so CSW signing off.     Ky Barban, MSW, [facility] Clinical Social Worker [phone]

## 2013-02-09 NOTE — Progress Notes (Addendum)
[name] [name] for heparin Indication: chest pain/ACS  Allergies  Allergen Reactions  . Demerol Itching  . Novocain [Procaine Hcl] Swelling    Patient Measurements: Height: 5\' 3"  (160 cm) Weight: 125 lb 14.1 oz (57.1 kg) IBW/kg (Calculated) : 52.4  Vital Signs: Temp: 98.8 F (37.1 C) (01/13 0748) Temp src: Oral (01/13 0748) BP: 97/60 mmHg (01/13 0748) Pulse Rate: 85 (01/13 0748)  Labs:  Recent Labs  02/06/13 1500  02/07/13 1800 02/08/13 0200 02/08/13 0235 02/09/13 0252  HGB  --   --   --   --  10.3* 11.4*  HCT  --   --   --   --  30.0* 34.7*  PLT  --   --   --   --  253 282  HEPARINUNFRC  --   < > 0.14* 0.45  --  0.32  CREATININE  --   --   --   --  0.98  --   TROPONINI 17.63*  --   --   --   --   --   < > = values in this interval not displayed.  Estimated Creatinine Clearance: 59.3 ml/min (by C-G formula based on Cr of 0.98).  Assessment: 48 yo female with NSTEMI likely caused by posterior descending artery plaque rupture with thrombus formation. Patient was on heparin prior to cath and will restart this afternoon 2 hours post wrist band removal. Also received orders to load patient with plavix and continue asa/plavix x 12 months.  Heparin gtt continues to be at goal this morning, no bleeding complications noted and cbc remains stable. Continue heparin at current rate for expected duration of 1-2 days.  Goal of Therapy:  Heparin level 0.3-0.7 units/ml Monitor platelets by anticoagulation protocol: Yes   Plan:  Continue heparin at previous of 750 units/hr Daily CBC/HL while on heparin Follow length of therapy for IV anticoagulation  Erin Hearing PharmD., BCPS Clinical Pharmacist Pager [phone] 02/09/2013 8:49 AM

## 2013-02-09 NOTE — Progress Notes (Addendum)
Patient ID: Elvie Palomo, female   DOB: [birth year], 48 y.o.   MRN: [id number]   SUBJECTIVE: No complaints this am  Discussed smoking cessation with her  Ambulating in CCU   . aspirin EC  325 mg Oral Daily  . atorvastatin  40 mg Oral q1800  . clopidogrel  75 mg Oral Q breakfast  . loratadine  10 mg Oral Daily  . metoprolol tartrate  25 mg Oral BID  . nicotine  14 mg Transdermal Daily  . sodium chloride  3 mL Intravenous Q12H   . heparin 750 Units/hr (02/08/13 2219)    OBJECTIVE: Physical Exam: Filed Vitals:   02/08/13 2358 02/09/13 0352 02/09/13 0748 02/09/13 0800  BP: 98/65 105/68 97/60   Pulse: 73 70 85 72  Temp: 98.6 F (37 C) 98.6 F (37 C) 98.8 F (37.1 C)   TempSrc: Oral Oral Oral   Resp:   20   Height:      Weight:      SpO2: 100% 100% 100% 100%    Intake/Output Summary (Last 24 hours) at 02/09/13 1012 Last data filed at 02/09/13 0900  Gross per 24 hour  Intake 1778.25 ml  Output    525 ml  Net 1253.25 ml    Telemetry reveals sinus rhythm  GEN- The patient is well appearing, alert and oriented x 3 today.   Head- normocephalic, atraumatic Eyes-  Sclera clear, conjunctiva pink Ears- hearing intact Oropharynx- clear Neck- supple, no JVP Lymph- no cervical lymphadenopathy Lungs- Clear to ausculation bilaterally, normal work of breathing Heart- Regular rate and rhythm, no murmurs, rubs or gallops, PMI not laterally displaced GI- soft, NT, ND, + BS Extremities- no clubbing, cyanosis, or edema Skin- no rash or lesion Psych- euthymic mood, full affect Neuro- strength and sensation are intact  LABS: Basic Metabolic Panel:  Recent Labs  02/08/13 0235  NA 142  K 3.7  CL 106  CO2 26  GLUCOSE 92  BUN 7  CREATININE 0.98  CALCIUM 8.6   Liver Function Tests: No results found for this basename: AST, ALT, ALKPHOS, BILITOT, PROT, ALBUMIN,  in the last 72 hours CBC:  Recent Labs  02/08/13 0235 02/09/13 0252  WBC 8.1 7.4  HGB 10.3* 11.4*  HCT 30.0*  34.7*  MCV 82.2 83.4  PLT 253 282   Cardiac Enzymes:  Recent Labs  02/06/13 1500  TROPONINI 17.63*   Hemoglobin A1C: No results found for this basename: HGBA1C,  in the last 72 hours Fasting Lipid Panel: No results found for this basename: CHOL, HDL, LDLCALC, TRIG, CHOLHDL, LDLDIRECT,  in the last 72 hours Thyroid Function Tests: No results found for this basename: TSH, T4TOTAL, FREET3, T3FREE, THYROIDAB,  in the last 72 hours  RADIOLOGY: Dg Chest Port 1 View  02/05/2013   CLINICAL DATA:  Chest pain and shortness of breath.  EXAM: PORTABLE CHEST - 1 VIEW  COMPARISON:  PA and lateral chest 08/08/2004.  FINDINGS: Lungs are clear. Heart size is normal. No pneumothorax or pleural fluid.  IMPRESSION: Negative chest.   Electronically Signed   By: Inge Rise M.D.   On: 02/05/2013 23:58   EKG 1/11  shows new inferior TWI when compared to EKG yesterday   ASSESSMENT AND PLAN:  Active Problems:   NSTEMI (non-ST elevated myocardial infarction)  1. NSTEMI Thrombus in ostium PDA  No stent  Continue heparin for 24 more hours ASA and Plavix  Transfer to floor  D/c in am if stable  2. Tobacco use Cessation  advised Will start nicotine patch  3. Polysubstance abuse She reports ongoing drug abuse.  Cocaine positive on admit.  Not clear that this was the inciting factor For her MI as she is a heavy smoker as well Cessation is strongly advised She was placed on beta blocker by admitting physician   4. Thyroid dysfunction T 4 normal 1.1     Jenkins Rouge, MD 02/09/2013 10:12 AM

## 2013-02-09 NOTE — Progress Notes (Signed)
CARDIAC REHAB PHASE I   PRE:  Rate/Rhythm: &* SR  BP:  Supine: 114/70  Sitting:   Standing:    SaO2:100%RA   MODE:  Ambulation: 700 ft   POST:  Rate/Rhythm: 91SR  BP:  Supine:   Sitting: 117/72  Standing:    SaO2: 100%RA 0935-1032 Pt walked 700 ft with steady gait. Tired by end of walk. Stopped a couple of times to rest. Education completed with pt voicing understanding. Gave financial form for CRP 2. Pt to fill out and make appt. We are referring to [place] Phase 2. Re enforced smoking cessation and gave information on classes here at [place]. Pt going to continue patches. Pt seems determined to make changes needed.    Graylon Good, RN BSN  02/09/2013 10:28 AM

## 2013-02-09 NOTE — Clinical Documentation Improvement (Signed)
THIS DOCUMENT IS NOT A PERMANENT PART OF THE MEDICAL RECORD  02/09/13  Dear Dr. Jenkins Rouge,  A cause and effect relationship may not be assumed and must be documented by a provider.  Please clarify the relationship, if any, between _NSTEMI_and _Cocaine positive on admission_, and  document in a progress note and/or discharge summary.    Are the conditions:  Due to or associated with each other (i.e. Cocaine induced MI)  Unrelated to each other  Unable to determine  Conditions documented as possible, probable, or suspected can be coded if restated at the time of discharge.    Thank You,  Posey Pronto, RN, BSN, [place] Clinical Documentation Improvement Specialist HIM department--[place] Office [phone]    Progress note ammended  Jenkins Rouge

## 2013-02-10 DIAGNOSIS — Z720 Tobacco use: Secondary | ICD-10-CM | POA: Diagnosis present

## 2013-02-10 DIAGNOSIS — E0590 Thyrotoxicosis, unspecified without thyrotoxic crisis or storm: Secondary | ICD-10-CM | POA: Diagnosis present

## 2013-02-10 DIAGNOSIS — F1910 Other psychoactive substance abuse, uncomplicated: Secondary | ICD-10-CM | POA: Diagnosis present

## 2013-02-10 LAB — CBC
HCT: 32.8 % — ABNORMAL LOW (ref 36.0–46.0)
Hemoglobin: 11.1 g/dL — ABNORMAL LOW (ref 12.0–15.0)
MCH: 27.8 pg (ref 26.0–34.0)
MCHC: 33.8 g/dL (ref 30.0–36.0)
MCV: 82.2 fL (ref 78.0–100.0)
PLATELETS: 287 10*3/uL (ref 150–400)
RBC: 3.99 MIL/uL (ref 3.87–5.11)
RDW: 14.2 % (ref 11.5–15.5)
WBC: 7.5 10*3/uL (ref 4.0–10.5)

## 2013-02-10 LAB — HEPARIN LEVEL (UNFRACTIONATED): Heparin Unfractionated: 0.33 IU/mL (ref 0.30–0.70)

## 2013-02-10 MED ORDER — ISOSORBIDE MONONITRATE 15 MG HALF TABLET
15.0000 mg | ORAL_TABLET | Freq: Every day | ORAL | Status: DC
  Administered 2013-02-10 – 2013-02-11 (×2): 15 mg via ORAL
  Filled 2013-02-10 (×2): qty 1

## 2013-02-10 NOTE — Progress Notes (Signed)
[place] for heparin Indication: chest pain/ACS  Allergies  Allergen Reactions  . Demerol Itching  . Novocain [Procaine Hcl] Swelling    Patient Measurements: Height: 5\' 3"  (160 cm) Weight: 125 lb 14.1 oz (57.1 kg) IBW/kg (Calculated) : 52.4  Vital Signs: Temp: 98.7 F (37.1 C) (01/14 0618) Temp src: Oral (01/14 0618) BP: 103/66 mmHg (01/14 0618) Pulse Rate: 77 (01/14 0618)  Labs:  Recent Labs  02/08/13 0200  02/08/13 0235 02/09/13 0252 02/10/13 0500  HGB  --   < > 10.3* 11.4* 11.1*  HCT  --   --  30.0* 34.7* 32.8*  PLT  --   --  253 282 287  HEPARINUNFRC 0.45  --   --  0.32 0.33  CREATININE  --   --  0.98  --   --   < > = values in this interval not displayed.  Estimated Creatinine Clearance: 59.3 ml/min (by C-G formula based on Cr of 0.98).  Assessment: 48 yo female with NSTEMI likely caused by posterior descending artery plaque rupture with thrombus formation. Patient was on heparin prior to cath and will restart this afternoon 2 hours post wrist band removal. Also received orders to load patient with plavix and continue asa/plavix x 12 months.  Heparin gtt continues to be at goal this morning, no bleeding complications noted and cbc remains stable. Continue heparin at current rate for expected duration of 24 hours  Goal of Therapy:  Heparin level 0.3-0.7 units/ml Monitor platelets by anticoagulation protocol: Yes   Plan:  Continue heparin at previous of 750 units/hr Daily CBC/HL while on heparin Follow length of therapy for IV anticoagulation  Thank you. Anette Guarneri, PharmD [phone]  02/10/2013 10:19 AM

## 2013-02-10 NOTE — Progress Notes (Signed)
Subjective: No Complaints  Objective: Vital signs in last 24 hours: Temp:  [98.1 F (36.7 C)-98.7 F (37.1 C)] 98.7 F (37.1 C) (01/14 0618) Pulse Rate:  [68-83] 77 (01/14 0618) Resp:  [18-20] 18 (01/14 0618) BP: (103-130)/(61-88) 103/66 mmHg (01/14 0618) SpO2:  [100 %] 100 % (01/14 0618) Last BM Date: 02/09/13  Intake/Output from previous day: 01/13 0701 - 01/14 0700 In: 253 [I.V.:253] Out: -  Intake/Output this shift:    Medications Current Facility-Administered Medications  Medication Dose Route Frequency Provider Last Rate Last Dose  . acetaminophen (TYLENOL) tablet 650 mg  650 mg Oral Q4H PRN Sueanne Margarita, MD   650 mg at 02/06/13 0952  . aspirin EC tablet 325 mg  325 mg Oral Daily Belva Crome III, MD   325 mg at 02/09/13 1034  . atorvastatin (LIPITOR) tablet 40 mg  40 mg Oral q1800 Sueanne Margarita, MD   40 mg at 02/09/13 1626  . clopidogrel (PLAVIX) tablet 75 mg  75 mg Oral Q breakfast Belva Crome III, MD   75 mg at 02/10/13 0843  . heparin ADULT infusion 100 units/mL (25000 units/250 mL)  750 Units/hr Intravenous Continuous Georgina Peer, RPH 7.5 mL/hr at 02/10/13 0630 750 Units/hr at 02/10/13 0630  . loratadine (CLARITIN) tablet 10 mg  10 mg Oral Daily Sueanne Margarita, MD   10 mg at 02/09/13 1034  . magnesium hydroxide (MILK OF MAGNESIA) suspension 30 mL  30 mL Oral Daily PRN Sueanne Margarita, MD   30 mL at 02/09/13 1043  . metoprolol tartrate (LOPRESSOR) tablet 25 mg  25 mg Oral BID Sueanne Margarita, MD   25 mg at 02/09/13 2157  . nicotine (NICODERM CQ - dosed in mg/24 hours) patch 14 mg  14 mg Transdermal Daily Thompson Grayer, MD   14 mg at 02/09/13 1036  . nitroGLYCERIN (NITROSTAT) SL tablet 0.4 mg  0.4 mg Sublingual Q5 Min x 3 PRN Sueanne Margarita, MD      . ondansetron (ZOFRAN) injection 4 mg  4 mg Intravenous Q6H PRN Sueanne Margarita, MD      . oxyCODONE-acetaminophen (PERCOCET/ROXICET) 5-325 MG per tablet 1-2 tablet  1-2 tablet Oral Q4H PRN Sinclair Grooms, MD    1 tablet at 02/08/13 1323    PE: General appearance: alert, cooperative and no distress Lungs: clear to auscultation bilaterally Heart: regular rate and rhythm, S1, S2 normal, no murmur, click, rub or gallop Extremities: Noi LEE Pulses: 2+ and symmetric Skin: Warm and dry Neurologic: Grossly normal  Lab Results:   Recent Labs  02/08/13 0235 02/09/13 0252 02/10/13 0500  WBC 8.1 7.4 7.5  HGB 10.3* 11.4* 11.1*  HCT 30.0* 34.7* 32.8*  PLT 253 282 287   BMET  Recent Labs  02/08/13 0235  NA 142  K 3.7  CL 106  CO2 26  GLUCOSE 92  BUN 7  CREATININE 0.98  CALCIUM 8.6    Assessment/Plan   Principal Problem:   NSTEMI (non-ST elevated myocardial infarction) Active Problems:   Tobacco abuse   Polysubstance abuse:  Cocaine, marijuana   Thyroid dysfunction  Plan:  SP left heart cath revealing posterior descending artery plaque rupture with thrombus formation.  Plavix started in addition to ASA for 12 months.  Heparin for another 24hours.   BP and HR stable.  2D echo normal with EF 60-65%.  Low TSH and FT4-WNL.  OP Cardiac rehab initiated.  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  Patient is currently on lopressor and was cocaine positive at admission.  ? Appropriateness of continuing this medication.    LOS: 5 days    HAGER, BRYAN 02/10/2013 9:46 AM  The patient was seen, examined and discussed with Tarri Fuller, PA-C and I agree with the above.   The patient is asymptomatic, she needs to finish Heparin infusion for another 24 hours, we will plan for discharge tomorrow. CM and SW seen for polysubstance abuse. She will require dual antiplatelet therapy for at least 12 months. BP controlled.  Ena Dawley, Lemmie Evens   02/10/2013

## 2013-02-10 NOTE — Progress Notes (Signed)
CARDIAC REHAB PHASE I   PRE:  Rate/Rhythm: 71SR  BP:  Supine: 102/80  Sitting:   Standing:    SaO2:   MODE:  Ambulation: 1100 ft   POST:  Rate/Rhythm: 73SR  BP:  Supine:   Sitting: 110/70  Standing:    SaO2:  1147-1207 Pt walked 1100 ft with steady gait. I just maintained IV pole. Pt can walk with boyfriend later as she had no CP or dizziness. To sitting on side of bed after walk. Tolerated well.   Graylon Good, RN BSN  02/10/2013 12:06 PM

## 2013-02-11 ENCOUNTER — Encounter (HOSPITAL_COMMUNITY): Payer: Self-pay | Admitting: Nurse Practitioner

## 2013-02-11 DIAGNOSIS — E785 Hyperlipidemia, unspecified: Secondary | ICD-10-CM

## 2013-02-11 DIAGNOSIS — I2510 Atherosclerotic heart disease of native coronary artery without angina pectoris: Secondary | ICD-10-CM

## 2013-02-11 LAB — CBC
HEMATOCRIT: 32.5 % — AB (ref 36.0–46.0)
Hemoglobin: 10.7 g/dL — ABNORMAL LOW (ref 12.0–15.0)
MCH: 27.9 pg (ref 26.0–34.0)
MCHC: 32.9 g/dL (ref 30.0–36.0)
MCV: 84.9 fL (ref 78.0–100.0)
Platelets: 261 10*3/uL (ref 150–400)
RBC: 3.83 MIL/uL — ABNORMAL LOW (ref 3.87–5.11)
RDW: 14.5 % (ref 11.5–15.5)
WBC: 7.4 10*3/uL (ref 4.0–10.5)

## 2013-02-11 LAB — HEPARIN LEVEL (UNFRACTIONATED): HEPARIN UNFRACTIONATED: 0.49 [IU]/mL (ref 0.30–0.70)

## 2013-02-11 MED ORDER — CLOPIDOGREL BISULFATE 75 MG PO TABS: 75.0000 mg | ORAL_TABLET | Freq: Every day | ORAL | Status: DC

## 2013-02-11 MED ORDER — AMLODIPINE BESYLATE 2.5 MG PO TABS: 2.5000 mg | ORAL_TABLET | Freq: Every day | ORAL | Status: DC

## 2013-02-11 MED ORDER — AMLODIPINE BESYLATE 2.5 MG PO TABS
2.5000 mg | ORAL_TABLET | Freq: Every day | ORAL | Status: DC
  Filled 2013-02-11: qty 1

## 2013-02-11 MED ORDER — NICOTINE 14 MG/24HR TD PT24: 14.0000 mg | MEDICATED_PATCH | Freq: Every day | TRANSDERMAL | Status: DC

## 2013-02-11 MED ORDER — PRAVASTATIN SODIUM 40 MG PO TABS: 40.0000 mg | ORAL_TABLET | Freq: Every day | ORAL | Status: DC

## 2013-02-11 MED ORDER — ASPIRIN EC 81 MG PO TBEC: 81.0000 mg | DELAYED_RELEASE_TABLET | Freq: Every day | ORAL | Status: DC

## 2013-02-11 MED ORDER — CARVEDILOL 3.125 MG PO TABS
3.1250 mg | ORAL_TABLET | Freq: Two times a day (BID) | ORAL | Status: DC
  Filled 2013-02-11 (×2): qty 1

## 2013-02-11 MED ORDER — ASPIRIN 81 MG PO TBEC: 81.0000 mg | DELAYED_RELEASE_TABLET | Freq: Every day | ORAL | Status: AC

## 2013-02-11 MED ORDER — NITROGLYCERIN 0.4 MG SL SUBL: 0.4000 mg | SUBLINGUAL_TABLET | SUBLINGUAL | Status: DC | PRN

## 2013-02-11 MED ORDER — CARVEDILOL 3.125 MG PO TABS: 3.1250 mg | ORAL_TABLET | Freq: Two times a day (BID) | ORAL | Status: DC

## 2013-02-11 NOTE — Progress Notes (Signed)
[place] for heparin Indication: chest pain/ACS  Allergies  Allergen Reactions  . Demerol Itching  . Novocain [Procaine Hcl] Swelling    Patient Measurements: Height: 5\' 3"  (160 cm) Weight: 125 lb 14.1 oz (57.1 kg) IBW/kg (Calculated) : 52.4  Vital Signs: Temp: 98.4 F (36.9 C) (01/15 0500) Temp src: Oral (01/14 2205) BP: 102/65 mmHg (01/15 0500) Pulse Rate: 71 (01/15 0500)  Labs:  Recent Labs  02/09/13 0252 02/10/13 0500 02/11/13 0520 02/11/13 0828  HGB 11.4* 11.1* 10.7*  --   HCT 34.7* 32.8* 32.5*  --   PLT 282 287 261  --   HEPARINUNFRC 0.32 0.33  --  0.49    Estimated Creatinine Clearance: 59.3 ml/min (by C-G formula based on Cr of 0.98).  Assessment: 48 yo female with NSTEMI likely caused by posterior descending artery plaque rupture with thrombus formation. Patient was on heparin prior to cath and will restart this afternoon 2 hours post wrist band removal. Also received orders to load patient with plavix and continue asa/plavix x 12 months.  Heparin gtt continues to be at goal this morning, no bleeding complications noted and cbc remains stable. Continue heparin at current rate.  Goal of Therapy:  Heparin level 0.3-0.7 units/ml Monitor platelets by anticoagulation protocol: Yes   Plan:  Continue heparin at previous of 750 units/hr Daily CBC/HL while on heparin  Heparin should end today  Thank you. Anette Guarneri, PharmD [phone]  02/11/2013 9:01 AM

## 2013-02-11 NOTE — Discharge Summary (Signed)
Discharge Summary   Patient ID: [name] [name],  MRN: [id number], DOB/AGE: 10-06-1965 48 y.o.  Admit date: 02/05/2013 Discharge date: 02/11/2013  Primary Care Provider: No PCP Per Patient Primary Cardiologist: T. Turner, MD   Discharge Diagnoses Principal Problem:   NSTEMI (non-ST elevated myocardial infarction)  **In setting of cocaine use.  **Catheterization this admission showed nonocclusive intraluminal thrombus within the ostial RPDA ->Medically managed.  **Normal LV function by echo this admission.  Active Problems:   Polysubstance abuse:  Cocaine, marijuana   Tobacco abuse   Thyroid dysfunction   CAD (coronary artery disease)   Hyperlipidemia  Allergies Allergies  Allergen Reactions  . Demerol Itching  . Novocain [Procaine Hcl] Swelling   Procedures  2D Echocardiogram 1.10.2015  Study Conclusions  Left ventricle: The cavity size was normal. Wall thickness was normal. Systolic function was normal. The estimated ejection fraction was in the range of 60% to 65%. Wall motion was normal; there were no regional wall motion abnormalities. Left ventricular diastolic function parameters were normal.     _____________  Cardiac Catheterization 1.12.2015  ANGIOGRAPHIC DATA:   The left main coronary artery is normal.  The left anterior descending artery is widely patent. There is minimal mid vessel plaque obstruction the vessel by 25%. LAD is transapical.. The left circumflex artery is widely patent. 3 small obtuse marginal branches arise from it. Branches are tortuous but not obstructed. The ramus intermedius is widely patent and moderate in size.. The right coronary artery is dominant. The PDA ostium and proximal segment contains intraluminal thrombus. The vessel is otherwise widely patent. The thrombus is nonocclusive.Marland Kitchen LEFT VENTRICULOGRAM:  Left ventricular angiogram was done in the 30 RAO projection and revealed basal inferior wall mild to moderate hypokinesis. EF  65%. _____________  History of Present Illness  48 y/o female without prior cardiac history but with a history of polysubstance abuse including tobacco, cocaine, and marijuana.  She was in her usual state of health until the afternoon of 02/05/2013, when while at work, she developed severe chest pain radiating down her left arm associated with diaphoresis, nausea, and dyspnea.  EMS was called and she was apparently told that she may be having an anxiety attack, and she left work and went home.  At home, her chest pain intensified and she began to vomit.  EMS was called again and she was taken into the [facility].  There, she was treated with ASA, morphine, and zofran.  ECG showed nonspecific anterior T changes, while troponin was found to be greater than 20.  She was seen by cardiology and admitted for management of NSTEMI.  Hospital Course  Following admission, patient had no further chest pain.  She was monitored in the coronary intensive care unit and maintained on asa, heparin, and beta blocker therapy.  A 2D echocardiogram was performed on 02/06/2013, revealing normal LV function.  Urine drug screen returned positive for cocaine and marijuana and she was counseled on the importance of polysubstance cessation.  In the setting of acute MI, she was also counseled on the potential dangers of ongoing cocaine use in the setting of beta blocker therapy.  She remained pain free over the weekend and underwent diagnostic catheterization on 1/12, revealing relatively normal coronary arteries but there was evidence of non-occlusive intraluminal thrombus within the ostial right posterior descending artery.  LV function was again normal.  Medical therapy was recommended and following sheath pull, heparin was resumed and continued for additional 48 hrs.  During that period of time,  [name] [name] was transferred out to the floor.  She was placed on imdur therapy for mild recurrent chest pain, however we have had to  discontinue this secondary to headaches.  Instead, we have added low dose calcium channel blocker therapy.  We have discontinued her heparin this morning and she has been ambulating without difficulty.  She will be discharged home today in good condition.  Where possible, we have made her medications as affordable as possible and case management has also worked with her to enroll in the [employer] (34 days of free medicine).  Of note, [name] [name] was found to have a mildly suppressed TSH during this admission (0.171) with a normal Free T4 (1.12).  We have recommended that she obtain primary care follow-up for monitoring and management.  Discharge Vitals Blood pressure 102/65, pulse 71, temperature 98.4 F (36.9 C), temperature source Oral, resp. rate 18, height 5\' 3"  (1.6 m), weight 125 lb 14.1 oz (57.1 kg), last menstrual period 02/03/2013, SpO2 98.00%.  Filed Weights   02/06/13 0224  Weight: 125 lb 14.1 oz (57.1 kg)   Labs  CBC  Recent Labs  02/10/13 0500 02/11/13 0520  WBC 7.5 7.4  HGB 11.1* 10.7*  HCT 32.8* 32.5*  MCV 82.2 84.9  PLT 287 962   Basic Metabolic Panel Lab Results  Component Value Date   CREATININE 0.98 02/08/2013   BUN 7 02/08/2013   NA 142 02/08/2013   K 3.7 02/08/2013   CL 106 02/08/2013   CO2 26 02/08/2013    Liver Function Tests Lab Results  Component Value Date   ALT 12 02/06/2013   AST 61* 02/06/2013   ALKPHOS 76 02/06/2013   BILITOT 0.3 02/06/2013    Cardiac Enzymes Lab Results  Component Value Date   TROPONINI 17.63* 02/06/2013   Hemoglobin A1C Lab Results  Component Value Date   HGBA1C 5.9* 02/06/2013   Fasting Lipid Panel Lab Results  Component Value Date   CHOL 176 02/06/2013   HDL 49 02/06/2013   LDLCALC 122* 02/06/2013   TRIG 27 02/06/2013   CHOLHDL 3.6 02/06/2013    Thyroid Function Tests Lab Results  Component Value Date   TSH 0.171* 02/06/2013         Free T4                  1.12  Disposition  Pt is being discharged home today in  good condition.  Follow-up Plans & Appointments      Follow-up Information   Follow up with Murray Hodgkins, NP On 02/11/2013. (8:30 AM - Dr. Theodosia Blender Nurse Practitioner)    Specialty:  Nurse Practitioner   Contact information:   [address] [place] [address] [phone]       Follow up with Obtain a primary care provider. (1-2 wks.)      Discharge Medications    Medication List    STOP taking these medications       ibuprofen 200 MG tablet  Commonly known as:  ADVIL,MOTRIN      TAKE these medications       amLODipine 2.5 MG tablet  Commonly known as:  NORVASC  Take 1 tablet (2.5 mg total) by mouth daily.     aspirin 81 MG EC tablet  Take 1 tablet (81 mg total) by mouth daily.  Start taking on:  02/12/2013     carvedilol 3.125 MG tablet  Commonly known as:  COREG  Take 1 tablet (3.125 mg  total) by mouth 2 (two) times daily with a meal.     clopidogrel 75 MG tablet  Commonly known as:  PLAVIX  Take 1 tablet (75 mg total) by mouth daily with breakfast.     loratadine 10 MG tablet  Commonly known as:  CLARITIN  Take 10 mg by mouth daily.     nicotine 14 mg/24hr patch  Commonly known as:  NICODERM CQ - dosed in mg/24 hours  Place 1 patch (14 mg total) onto the skin daily.     nitroGLYCERIN 0.4 MG SL tablet  Commonly known as:  NITROSTAT  Place 1 tablet (0.4 mg total) under the tongue every 5 (five) minutes x 3 doses as needed for chest pain.     pravastatin 40 MG tablet  Commonly known as:  PRAVACHOL  Take 1 tablet (40 mg total) by mouth daily.      Outstanding Labs/Studies  Follow-up lipids/lft's in 6-8 wks.  Duration of Discharge Encounter   Greater than 30 minutes including physician time.  Signed, Murray Hodgkins NP 02/11/2013, 12:37 PM

## 2013-02-11 NOTE — Progress Notes (Signed)
Patient Name: [name] [name] Date of Encounter: 02/11/2013   Principal Problem:   NSTEMI (non-ST elevated myocardial infarction) Active Problems:   Polysubstance abuse:  Cocaine, marijuana   Tobacco abuse   Thyroid dysfunction   CAD (coronary artery disease)   Hyperlipidemia   SUBJECTIVE  Had an episode of chest pain yesterday - relieved w/ sl ntg.  Says that she's been having h/a's 2/2 imdur, relieved with percocet - hopeful that she can get some percocet @ d/c.  CURRENT MEDS . aspirin EC  325 mg Oral Daily  . atorvastatin  40 mg Oral q1800  . clopidogrel  75 mg Oral Q breakfast  . isosorbide mononitrate  15 mg Oral Daily  . loratadine  10 mg Oral Daily  . metoprolol tartrate  25 mg Oral BID  . nicotine  14 mg Transdermal Daily   OBJECTIVE  Filed Vitals:   02/10/13 0618 02/10/13 1446 02/10/13 2205 02/11/13 0500  BP: 103/66 107/73 114/71 102/65  Pulse: 77 77 77 71  Temp: 98.7 F (37.1 C) 98.2 F (36.8 C) 98.1 F (36.7 C) 98.4 F (36.9 C)  TempSrc: Oral Oral Oral   Resp: 18 18 18 18   Height:      Weight:      SpO2: 100% 98% 99% 98%    Intake/Output Summary (Last 24 hours) at 02/11/13 1004 Last data filed at 02/10/13 2200  Gross per 24 hour  Intake 953.63 ml  Output      0 ml  Net 953.63 ml   Filed Weights   02/06/13 0224  Weight: 125 lb 14.1 oz (57.1 kg)    PHYSICAL EXAM  General: Pleasant, NAD. Neuro: Alert and oriented X 3. Moves all extremities spontaneously. Psych: Normal affect. HEENT:  Normal  Neck: Supple without bruits or JVD. Lungs:  Resp regular and unlabored, CTA. Heart: RRR no s3, s4, or murmurs. Abdomen: Soft, non-tender, non-distended, BS + x 4.  Extremities: No clubbing, cyanosis or edema. DP/PT/Radials 2+ and equal bilaterally. L wrist cath site w/o bleeding/bruit/hematoma.  Accessory Clinical Findings  CBC  Recent Labs  02/10/13 0500 02/11/13 0520  WBC 7.5 7.4  HGB 11.1* 10.7*  HCT 32.8* 32.5*  MCV 82.2 84.9  PLT 287 261     TELE  rsr  ASSESSMENT AND PLAN  1.  NSTEMI:  S/p cath this admission revealing thrombotic debris in the distal RCA and otw nl cors.  She has been maintained on heparin since cath on 1/12.  She did have some nitrate responsive c/p yesterday.  She is not tolerating long-acting nitrates 2/2 headache.  D/c imdur.  D/c heparin.  Ambulate.  Switch lopressor to coreg in setting of cocaine abuse. Consider adding low-dose amlodipine since she cannot tolerate nitrates - though she doesn't have very much BP to work with and thus if we remain concerned about vasospasm in setting of cocaine abuse, we may need to d/c BB altogether to make BP room for CCB.  Cont asa, statin, plavix.  Possible d/c today if ambulating w/o difficulty.  2.  Cocaine Abuse/Tob Abuse:  Family in room at time of visit and thus I could not have an honest discussion with her.  She has previously been counseled on complete cessation.  3.  Low TSH:  TSH = 0.171.  Nl FT4.  She will need outpt IM f/u in the future.  4.  Hyperlipidemia:  LDL 122.  Cont statin.  AST mildly elevated.  Will need f/u lipids/lft's in 6-8 wks.  5.  Normocytic anemia:  Stable.  Signed, Murray Hodgkins NP   The patient was seen, examined and discussed with Carmela Rima, NP and agree as above.  The patient is asymptomatic, finished Heparin infusion for RCA thrombus. Mild chest pain yesterday relived by NTD, but Imdur had to be stopped due to headaches. We will switch metoprolol 25 mg po BID to a very low dose of coreq 3.125 mg po bid to allow room for amlodipine (antianginal therapy, also in case of Prinzmetal angina).   CM and SW seen for polysubstance abuse. She will require dual antiplatelet therapy for at least 12 months.   Ena Dawley, Lemmie Evens 02/11/2013

## 2013-02-11 NOTE — Discharge Instructions (Signed)
**  PLEASE REMEMBER TO BRING ALL OF YOUR MEDICATIONS TO EACH OF YOUR FOLLOW-UP OFFICE VISITS. ° °NO HEAVY LIFTING X 2 WEEKS. °NO SEXUAL ACTIVITY X 2 WEEKS. °NO DRIVING X 1 WEEK. °NO SOAKING BATHS, HOT TUBS, POOLS, ETC., X 7 DAYS. ° °Radial Site Care °Refer to this sheet in the next few weeks. These instructions provide you with information on caring for yourself after your procedure. Your caregiver may also give you more specific instructions. Your treatment has been planned according to current medical practices, but problems sometimes occur. Call your caregiver if you have any problems or questions after your procedure. °HOME CARE INSTRUCTIONS °· You may shower the day after the procedure. Remove the bandage (dressing) and gently wash the site with plain soap and water. Gently pat the site dry.  °· Do not apply powder or lotion to the site.  °· Do not submerge the affected site in water for 3 to 5 days.  °· Inspect the site at least twice daily.  °· Do not flex or bend the affected arm for 24 hours.  °· No lifting over 5 pounds (2.3 kg) for 5 days after your procedure.  ° °What to expect: °· Any bruising will usually fade within 1 to 2 weeks.  °· Blood that collects in the tissue (hematoma) may be painful to the touch. It should usually decrease in size and tenderness within 1 to 2 weeks.  °SEEK IMMEDIATE MEDICAL CARE IF: °· You have unusual pain at the radial site.  °· You have redness, warmth, swelling, or pain at the radial site.  °· You have drainage (other than a small amount of blood on the dressing).  °· You have chills.  °· You have a fever or persistent symptoms for more than 72 hours.  °· You have a fever and your symptoms suddenly get worse.  °· Your arm becomes pale, cool, tingly, or numb.  °· You have heavy bleeding from the site. Hold pressure on the site.  ° °

## 2013-02-11 NOTE — Discharge Summary (Signed)
Ena Dawley, Lemmie Evens 02/11/2013

## 2013-02-12 ENCOUNTER — Telehealth: Payer: Self-pay | Admitting: Nurse Practitioner

## 2013-02-12 NOTE — Telephone Encounter (Signed)
New message    Called pt to confirm her appt for Monday and she said her legs are swelling and want to talk to a nurse.  She has not been seen in our office.

## 2013-02-12 NOTE — Telephone Encounter (Signed)
C/o of bilateral ankle swelling after walking this morning. Pt elevated legs and it reduced. Denies redness and warmth to the ankles. Sodium foods were reviewed and pt has avoided salt. Pt was told to continue to monitor and ask more questions on ov. Pt was agreeable to plan, she was encouraged to call with concerns.

## 2013-02-15 ENCOUNTER — Encounter: Payer: Self-pay | Admitting: Nurse Practitioner

## 2013-02-15 ENCOUNTER — Ambulatory Visit (INDEPENDENT_AMBULATORY_CARE_PROVIDER_SITE_OTHER): Payer: No Typology Code available for payment source | Admitting: Nurse Practitioner

## 2013-02-15 VITALS — BP 108/78 | HR 113 | Ht 63.0 in | Wt 128.0 lb

## 2013-02-15 DIAGNOSIS — E785 Hyperlipidemia, unspecified: Secondary | ICD-10-CM

## 2013-02-15 DIAGNOSIS — I2510 Atherosclerotic heart disease of native coronary artery without angina pectoris: Secondary | ICD-10-CM

## 2013-02-15 DIAGNOSIS — F1910 Other psychoactive substance abuse, uncomplicated: Secondary | ICD-10-CM

## 2013-02-15 DIAGNOSIS — I214 Non-ST elevation (NSTEMI) myocardial infarction: Secondary | ICD-10-CM

## 2013-02-15 NOTE — Patient Instructions (Addendum)
Your physician recommends that you schedule a follow-up appointment in: [place]   Your physician recommends that you return for lab work in: [facility]( [place])  Your physician has recommended you make the following change in your medication:   STOP YOUR AMLODIPINE  STAY ON ALL OTHER MEDICATION

## 2013-02-15 NOTE — Progress Notes (Signed)
Patient Name: [name] [name] Date of Encounter: 02/15/2013  Primary Care Provider:  No PCP Per Patient Primary Cardiologist:  Liane Comber, MD   Patient Profile  48 year old female status post recent hospital stay she for non-ST segment elevation myocardial infarction in setting of cocaine abuse.  Problem List   Past Medical History  Diagnosis Date  . Abnormal Pap smear     Colpo (twice)  . Anemia   . Chlamydia     years ago  . Trichomonas     years ago  . Breast disorder     soreness in left breast   . Depression   . CAD (coronary artery disease)     a. 01/2013 NSTEMI/Cath: LM nl, LAD 1m, LCX nl, RI nl, RCA nl, PDA ostial nonocclusive intraluminal thrombus, EF 65%, Treated with heparin x 48 hrs->Med Rx (occurred in setting of + cocaine);  b. 01/2013 Echo: EF 60-65%, nl wall motion.  . Polysubstance abuse     tobacco/cocaine/marijuana (+ UDS 01/2013).   Past Surgical History  Procedure Laterality Date  . Cesarean section      2 c-sections  . Breast surgery      lump removed from right breast  . Nova sure    . Groin mass open biopsy    . Cervical cone biopsy      Allergies  Allergies  Allergen Reactions  . Demerol Itching  . Novocain [Procaine Hcl] Swelling    HPI  48 year old female who was recently admitted to [facility] with non-ST segment elevation myocardial infarction.  Her urine drug screen was positive for cocaine.  She underwent catheterization which showed nonobstructive disease and evidence of resolving thrombus within the RPDA.  She did not require intervention.  Instead, heparin was run for an additional 48 hours post procedure.  While hospitalized, she was tried on long-acting nitrate therapy however this caused headaches and decided to discharge to switch her to low-dose amlodipine.  She was counseled extensively on the importance of tobacco and cocaine cessation.  Since her discharge, she has not gone back to either tobacco or cocaine.  She has been  feeling somewhat fatigued but has not any significant chest pain.  She has noted some mild dyspnea on exertion though notes that she was not previously performing any activities.  She has been exercising lately as recommended by cardiac rehabilitation while she was in the hospital.  She sometimes feels dizzy when she stands up but has not experienced any syncope.  She has also noted intermittent bilateral malleolar mild edema which improves by raising her legs.  She denies PND, orthopnea, early satiety, or palpitations.  Home Medications  Prior to Admission medications   Medication Sig Start Date End Date Taking? Authorizing Provider  amLODipine (NORVASC) 2.5 MG tablet Take 1 tablet (2.5 mg total) by mouth daily. 02/11/13  Yes Rogelia Mire, NP  aspirin EC 81 MG EC tablet Take 1 tablet (81 mg total) by mouth daily. 02/12/13  Yes Rogelia Mire, NP  carvedilol (COREG) 3.125 MG tablet Take 1 tablet (3.125 mg total) by mouth 2 (two) times daily with a meal. 02/11/13  Yes Rogelia Mire, NP  clopidogrel (PLAVIX) 75 MG tablet Take 1 tablet (75 mg total) by mouth daily with breakfast. 02/11/13  Yes Rogelia Mire, NP  loratadine (CLARITIN) 10 MG tablet Take 10 mg by mouth daily.   Yes Historical Provider, MD  nicotine (NICODERM CQ - DOSED IN MG/24 HOURS) 14 mg/24hr patch Place 1  patch (14 mg total) onto the skin daily. 02/11/13  Yes Rogelia Mire, NP  nitroGLYCERIN (NITROSTAT) 0.4 MG SL tablet Place 1 tablet (0.4 mg total) under the tongue every 5 (five) minutes x 3 doses as needed for chest pain. 02/11/13  Yes Rogelia Mire, NP  pravastatin (PRAVACHOL) 40 MG tablet Take 1 tablet (40 mg total) by mouth daily. 02/11/13  Yes Rogelia Mire, NP    Family History  Family History  Problem Relation Age of Onset  . Cancer Mother     breast  . Hypertension Brother   . Kidney failure Brother   . Hypertension Sister     Social History  History   Social History  .  Marital Status: Single    Spouse Name: N/A    Number of Children: N/A  . Years of Education: N/A   Occupational History  . Not on file.   Social History Main Topics  . Smoking status: Current Every Day Smoker -- 0.50 packs/day for 28 years    Types: Cigarettes  . Smokeless tobacco: Never Used  . Alcohol Use: Yes     Comment: rarely  . Drug Use: No  . Sexual Activity: Yes    Birth Control/ Protection: None   Other Topics Concern  . Not on file   Social History Narrative  . No narrative on file     Review of Systems General:  She has felt fatigued as above.  No chills, fever, night sweats or weight changes.  Cardiovascular:  She is in mild ankle edema.  No chest pain, positive dyspnea on exertion, orthopnea, palpitations, paroxysmal nocturnal dyspnea. Dermatological: No rash, lesions/masses Respiratory: No cough, mild dyspnea on exertion. Urologic: No hematuria, dysuria Abdominal:   No nausea, vomiting, diarrhea, bright red blood per rectum, melena, or hematemesis Neurologic:  No visual changes, wkns, changes in mental status. All other systems reviewed and are otherwise negative except as noted above.  Physical Exam  Blood pressure 103/68, pulse 90, height 5\' 3"  (1.6 m), weight 128 lb (58.06 kg), last menstrual period 02/03/2013.  General: Pleasant, NAD Psych: Normal affect. Neuro: Alert and oriented X 3. Moves all extremities spontaneously. HEENT: Normal  Neck: Supple without bruits or JVD. Lungs:  Resp regular and unlabored, somewhat diminished breath sounds bilaterally. Heart: RRR no s3, s4, or murmurs. Abdomen: Soft, non-tender, non-distended, BS + x 4.  Extremities: No clubbing, cyanosis or edema. DP/PT/Radials 2+ and equal bilaterally.  Accessory Clinical Findings  ECG - rate is sinus rhythm, 90, right atrial enlargement, inferolateral T-wave inversion not significantly different from hospital ECGs.  Assessment & Plan  1.  Non-ST segment elevation myocardial  infarction, subsequent episode of care/CAD: Status post MI in the setting of cocaine abuse.  She did have resolving intraluminal thrombus noted on catheterization which was treated with 48 hours additional heparinization.  Since her hospitalization, she has been feeling somewhat washed out and fatigued and has noted mild dyspnea exertion.  Her blood pressure is on the low side, as it was in the hospital. Orthostatics today do not show significant hypotension however her HR did rise to the 1-teens.  We discussed the utmost importance of complete cocaine cessation and she indicates that she will never use again.  As such, I will discontinue her amlodipine.  This has also been causing mild ankle edema.  Cont asa, statin, bb (she understands risks of cocaine use in setting of bb), and plavix.    2.  Polysubstance Abuse: She has  quit tobacco, cocaine, and marijuana.  We had a long discussion as to why cessation is so important.  She expressed that if any family is in the room with her in the future (no one here today), that we are not to mention her substance abuse issues in front of them.  I reassured her that we would not.  3.  Hyperlipidemia:  Cont statin.  F/u lipids/lft's in 4 wks.  4.  Dispo:  F/u in 2 wks.    Murray Hodgkins, NP 02/15/2013, 9:04 AM

## 2013-02-22 ENCOUNTER — Encounter: Payer: Self-pay | Admitting: *Deleted

## 2013-03-01 ENCOUNTER — Ambulatory Visit (INDEPENDENT_AMBULATORY_CARE_PROVIDER_SITE_OTHER): Payer: No Typology Code available for payment source | Admitting: Cardiology

## 2013-03-01 ENCOUNTER — Other Ambulatory Visit: Payer: No Typology Code available for payment source

## 2013-03-01 ENCOUNTER — Encounter: Payer: Self-pay | Admitting: Cardiology

## 2013-03-01 VITALS — BP 104/70 | HR 65 | Ht 63.0 in | Wt 130.1 lb

## 2013-03-01 DIAGNOSIS — I2510 Atherosclerotic heart disease of native coronary artery without angina pectoris: Secondary | ICD-10-CM

## 2013-03-01 DIAGNOSIS — E079 Disorder of thyroid, unspecified: Secondary | ICD-10-CM

## 2013-03-01 DIAGNOSIS — I214 Non-ST elevation (NSTEMI) myocardial infarction: Secondary | ICD-10-CM

## 2013-03-01 LAB — COMPREHENSIVE METABOLIC PANEL
ALT: 24 U/L (ref 0–35)
AST: 18 U/L (ref 0–37)
Albumin: 3.7 g/dL (ref 3.5–5.2)
Alkaline Phosphatase: 70 U/L (ref 39–117)
BUN: 10 mg/dL (ref 6–23)
CO2: 27 mEq/L (ref 19–32)
Calcium: 9.1 mg/dL (ref 8.4–10.5)
Chloride: 105 mEq/L (ref 96–112)
Creatinine, Ser: 0.9 mg/dL (ref 0.4–1.2)
GFR: 86.66 mL/min (ref >60.00)
Glucose, Bld: 92 mg/dL (ref 70–99)
Potassium: 4.1 mEq/L (ref 3.5–5.1)
Sodium: 138 mEq/L (ref 135–145)
Total Bilirubin: 0.5 mg/dL (ref 0.3–1.2)
Total Protein: 7.6 g/dL (ref 6.0–8.3)

## 2013-03-01 LAB — LIPID PANEL
Cholesterol: 152 mg/dL (ref 0–200)
HDL: 44 mg/dL (ref >39.00)
LDL Cholesterol: 94 mg/dL (ref 0–99)
Total CHOL/HDL Ratio: 3
Triglycerides: 69 mg/dL (ref 0.0–149.0)
VLDL: 13.8 mg/dL (ref 0.0–40.0)

## 2013-03-01 LAB — HEPATIC FUNCTION PANEL
ALT: 24 U/L (ref 0–35)
AST: 18 U/L (ref 0–37)
Albumin: 3.7 g/dL (ref 3.5–5.2)
Alkaline Phosphatase: 70 U/L (ref 39–117)
BILIRUBIN DIRECT: 0 mg/dL (ref 0.0–0.3)
BILIRUBIN TOTAL: 0.5 mg/dL (ref 0.3–1.2)
Total Protein: 7.6 g/dL (ref 6.0–8.3)

## 2013-03-01 LAB — TSH: TSH: 0.32 u[IU]/mL — ABNORMAL LOW (ref 0.35–5.50)

## 2013-03-01 NOTE — Progress Notes (Signed)
Patient ID: [name] [name], female   DOB: 1965-05-05, 48 y.o.   MRN: [id number]   Patient Name: [name] [name] Date of Encounter: 03/01/2013  Primary Care Provider:  No PCP Per Patient Primary Cardiologist:  Liane Comber, MD   Patient Profile  48 year old female status post recent hospital stay she for non-ST segment elevation myocardial infarction in setting of cocaine abuse.  Problem List   Past Medical History  Diagnosis Date  . Abnormal Pap smear     Colpo (twice)  . Anemia   . Chlamydia     years ago  . Trichomonas     years ago  . Breast disorder     soreness in left breast   . Depression   . CAD (coronary artery disease)     a. 01/2013 NSTEMI/Cath: LM nl, LAD 39m, LCX nl, RI nl, RCA nl, PDA ostial nonocclusive intraluminal thrombus, EF 65%, Treated with heparin x 48 hrs->Med Rx (occurred in setting of + cocaine);  b. 01/2013 Echo: EF 60-65%, nl wall motion.  . Polysubstance abuse     tobacco/cocaine/marijuana (+ UDS 01/2013).   Past Surgical History  Procedure Laterality Date  . Cesarean section      2 c-sections  . Breast surgery      lump removed from right breast  . Nova sure    . Groin mass open biopsy    . Cervical cone biopsy      Allergies  Allergies  Allergen Reactions  . Demerol Itching  . Novocain [Procaine Hcl] Swelling    HPI  48 year old female who was recently admitted to [facility] with non-ST segment elevation myocardial infarction.  Her urine drug screen was positive for cocaine.  She underwent catheterization which showed nonobstructive disease and evidence of resolving thrombus within the RPDA.  She did not require intervention.  Instead, heparin was run for an additional 48 hours post procedure.  While hospitalized, she was tried on long-acting nitrate therapy however this caused headaches and decided to discharge to switch her to low-dose amlodipine.  She was counseled extensively on the importance of tobacco and cocaine cessation.  Since her  discharge, she has not gone back to either tobacco or cocaine.   She has had 3 episodes of chest pain that resolved with NTG within 1 minute. She has started walking up to 15 minutes 2x daily, but notices significant fatigue after about 10 minutes of exercise. She is awaiting cardiac rehab, no return to work till then. She denies PND, orthopnea, early satiety, or palpitations.  Home Medications  Prior to Admission medications   Medication Sig Start Date End Date Taking? Authorizing Provider  amLODipine (NORVASC) 2.5 MG tablet Take 1 tablet (2.5 mg total) by mouth daily. 02/11/13  Yes Rogelia Mire, NP  aspirin EC 81 MG EC tablet Take 1 tablet (81 mg total) by mouth daily. 02/12/13  Yes Rogelia Mire, NP  carvedilol (COREG) 3.125 MG tablet Take 1 tablet (3.125 mg total) by mouth 2 (two) times daily with a meal. 02/11/13  Yes Rogelia Mire, NP  clopidogrel (PLAVIX) 75 MG tablet Take 1 tablet (75 mg total) by mouth daily with breakfast. 02/11/13  Yes Rogelia Mire, NP  loratadine (CLARITIN) 10 MG tablet Take 10 mg by mouth daily.   Yes Historical Provider, MD  nicotine (NICODERM CQ - DOSED IN MG/24 HOURS) 14 mg/24hr patch Place 1 patch (14 mg total) onto the skin daily. 02/11/13  Yes Rogelia Mire, NP  nitroGLYCERIN (NITROSTAT)  0.4 MG SL tablet Place 1 tablet (0.4 mg total) under the tongue every 5 (five) minutes x 3 doses as needed for chest pain. 02/11/13  Yes Rogelia Mire, NP  pravastatin (PRAVACHOL) 40 MG tablet Take 1 tablet (40 mg total) by mouth daily. 02/11/13  Yes Rogelia Mire, NP    Family History  Family History  Problem Relation Age of Onset  . Breast cancer Mother   . Hypertension Brother   . Kidney failure Brother   . Hypertension Sister     Social History  History   Social History  . Marital Status: Single    Spouse Name: N/A    Number of Children: N/A  . Years of Education: N/A   Occupational History  . Not on file.   Social  History Main Topics  . Smoking status: Former Smoker -- 0.50 packs/day for 28 years    Types: Cigarettes  . Smokeless tobacco: Never Used     Comment: February 05, 2013  . Alcohol Use: Yes     Comment: rarely  . Drug Use: No  . Sexual Activity: Yes    Birth Control/ Protection: None   Other Topics Concern  . Not on file   Social History Narrative  . No narrative on file     Review of Systems General:  She has felt fatigued as above.  No chills, fever, night sweats or weight changes.  Cardiovascular:  She is in mild ankle edema.  No chest pain, positive dyspnea on exertion, orthopnea, palpitations, paroxysmal nocturnal dyspnea. Dermatological: No rash, lesions/masses Respiratory: No cough, mild dyspnea on exertion. Urologic: No hematuria, dysuria Abdominal:   No nausea, vomiting, diarrhea, bright red blood per rectum, melena, or hematemesis Neurologic:  No visual changes, wkns, changes in mental status. All other systems reviewed and are otherwise negative except as noted above.  Physical Exam  Blood pressure 104/70, pulse 65, height 5\' 3"  (1.6 m), weight 130 lb 1.9 oz (59.022 kg), last menstrual period 02/03/2013, SpO2 98.00%.  General: Pleasant, NAD Psych: Normal affect. Neuro: Alert and oriented X 3. Moves all extremities spontaneously. HEENT: Normal  Neck: Supple without bruits or JVD. Lungs:  Resp regular and unlabored, somewhat diminished breath sounds bilaterally. Heart: RRR no s3, s4, or murmurs. Abdomen: Soft, non-tender, non-distended, BS + x 4.  Extremities: No clubbing, cyanosis or edema. DP/PT/Radials 2+ and equal bilaterally.  Accessory Clinical Findings  ECG - rate is sinus rhythm, 90, right atrial enlargement, inferolateral T-wave inversion not significantly different from hospital ECGs.  Assessment & Plan  1.  Non-ST segment elevation myocardial infarction, subsequent episode of care/CAD: Status post MI in the setting of cocaine abuse.  She did have  resolving intraluminal thrombus noted on catheterization which was treated with 48 hours additional heparinization.  Since her hospitalization, she has been feeling somewhat washed out and fatigued and has noted mild dyspnea exertion.  Her blood pressure is on the low side, as it was in the hospital. Cont asa, statin, bb (she understands risks of cocaine use in setting of bb), and plavix.    2.  Polysubstance Abuse: She has quit tobacco, cocaine, and marijuana.  We had a long discussion as to why cessation is so important.  She expressed that if any family is in the room with her in the future (no one here today), that we are not to mention her substance abuse issues in front of them.  I reassured her that we would not.  3.  Hyperlipidemia:  Cont statin.  F/u lipids/lft's in today, on Pravachol as her AST was elevated.  4.  Dispo:  F/u in 2 months.    Ena Dawley, H, NP 03/01/2013, 8:48 AM

## 2013-03-01 NOTE — Addendum Note (Signed)
Addended by: Eulis Foster on: 03/01/2013 09:05 AM   Modules accepted: Orders

## 2013-03-01 NOTE — Patient Instructions (Signed)
Your physician recommends that you continue on your current medications as directed. Please refer to the Current Medication list given to you today.  Your physician recommends that you go to the lab today for CMET and TSH  Your physician recommends that you schedule a follow-up appointment in: 2 Months with Dr Meda Coffee

## 2013-03-02 ENCOUNTER — Ambulatory Visit: Payer: No Typology Code available for payment source | Attending: Internal Medicine | Admitting: Internal Medicine

## 2013-03-02 ENCOUNTER — Encounter: Payer: Self-pay | Admitting: Internal Medicine

## 2013-03-02 ENCOUNTER — Inpatient Hospital Stay: Payer: No Typology Code available for payment source

## 2013-03-02 VITALS — BP 106/73 | HR 69 | Temp 97.8°F | Resp 14 | Ht 63.0 in | Wt 132.4 lb

## 2013-03-02 DIAGNOSIS — Z006 Encounter for examination for normal comparison and control in clinical research program: Secondary | ICD-10-CM | POA: Insufficient documentation

## 2013-03-02 DIAGNOSIS — E785 Hyperlipidemia, unspecified: Secondary | ICD-10-CM | POA: Insufficient documentation

## 2013-03-02 DIAGNOSIS — I214 Non-ST elevation (NSTEMI) myocardial infarction: Secondary | ICD-10-CM | POA: Insufficient documentation

## 2013-03-02 NOTE — Progress Notes (Signed)
Pt is here to establish care. Recently in the hospital for a heart attack. Pt suffers from depression. Used to take celexa x2 years. Needs to speak with the social worker. Pain in the chest on a scale of 2; slight muscle spasms. Taking medication for muscle spasms; medication helps. Also complains of fatigue and headaches after taking Nitroglycerin, and headaches. May need a referral for eye specialist.

## 2013-03-02 NOTE — Progress Notes (Signed)
Patient ID: [name] [name], female   DOB: January 24, 1966, 48 y.o.   MRN: [id number]   CC:  HPI: 48 year old the female status was most likely she non-ST elevation MI in the setting of cocaine abuse and discharged on 1/9. Cardiac catheterization showed normal LAD, she had a nonocclusive intraluminal thrombus, was hospitalized for 2 more days for heparinize patient for 48 hours post procedure, was initiated on long acting nitrate,  She denies any chest pain, denies any shortness of breath, she received paperwork yesterday to initiate cardiac rehabilitation, I advised her not to drive and not to go to work until cleared by cardiology.She denies PND, orthopnea, early satiety, or palpitations.    Allergies  Allergen Reactions  . Demerol Itching  . Novocain [Procaine Hcl] Swelling   Past Medical History  Diagnosis Date  . Abnormal Pap smear     Colpo (twice)  . Anemia   . Chlamydia     years ago  . Trichomonas     years ago  . Breast disorder     soreness in left breast   . Depression   . CAD (coronary artery disease)     a. 01/2013 NSTEMI/Cath: LM nl, LAD 23m, LCX nl, RI nl, RCA nl, PDA ostial nonocclusive intraluminal thrombus, EF 65%, Treated with heparin x 48 hrs->Med Rx (occurred in setting of + cocaine);  b. 01/2013 Echo: EF 60-65%, nl wall motion.  . Polysubstance abuse     tobacco/cocaine/marijuana (+ UDS 01/2013).   Current Outpatient Prescriptions on File Prior to Visit  Medication Sig Dispense Refill  . aspirin EC 81 MG EC tablet Take 1 tablet (81 mg total) by mouth daily.      . carvedilol (COREG) 3.125 MG tablet Take 1 tablet (3.125 mg total) by mouth 2 (two) times daily with a meal.  68 tablet  0  . clopidogrel (PLAVIX) 75 MG tablet Take 1 tablet (75 mg total) by mouth daily with breakfast.  34 tablet  0  . loratadine (CLARITIN) 10 MG tablet Take 10 mg by mouth daily.      . nitroGLYCERIN (NITROSTAT) 0.4 MG SL tablet Place 1 tablet (0.4 mg total) under the tongue every 5 (five)  minutes x 3 doses as needed for chest pain.  25 tablet  3  . pravastatin (PRAVACHOL) 40 MG tablet Take 1 tablet (40 mg total) by mouth daily.  34 tablet  0   No current facility-administered medications on file prior to visit.   Family History  Problem Relation Age of Onset  . Breast cancer Mother   . Hypertension Brother   . Kidney failure Brother   . Hypertension Sister    History   Social History  . Marital Status: Single    Spouse Name: N/A    Number of Children: N/A  . Years of Education: N/A   Occupational History  . Not on file.   Social History Main Topics  . Smoking status: Former Smoker -- 0.50 packs/day for 28 years    Types: Cigarettes  . Smokeless tobacco: Never Used     Comment: February 05, 2013  . Alcohol Use: Yes     Comment: rarely  . Drug Use: No  . Sexual Activity: Yes    Birth Control/ Protection: None   Other Topics Concern  . Not on file   Social History Narrative  . No narrative on file    Review of Systems  Constitutional: Negative for fever, chills, diaphoresis, activity change, appetite change and fatigue.  HENT: Negative for ear pain, nosebleeds, congestion, facial swelling, rhinorrhea, neck pain, neck stiffness and ear discharge.   Eyes: Negative for pain, discharge, redness, itching and visual disturbance.  Respiratory: Negative for cough, choking, chest tightness, shortness of breath, wheezing and stridor.   Cardiovascular: Negative for chest pain, palpitations and leg swelling.  Gastrointestinal: Negative for abdominal distention.  Genitourinary: Negative for dysuria, urgency, frequency, hematuria, flank pain, decreased urine volume, difficulty urinating and dyspareunia.  Musculoskeletal: Negative for back pain, joint swelling, arthralgias and gait problem.  Neurological: Negative for dizziness, tremors, seizures, syncope, facial asymmetry, speech difficulty, weakness, light-headedness, numbness and headaches.  Hematological: Negative  for adenopathy. Does not bruise/bleed easily.  Psychiatric/Behavioral: Negative for hallucinations, behavioral problems, confusion, dysphoric mood, decreased concentration and agitation.    Objective:   Filed Vitals:   03/02/13 1706  BP: 106/73  Pulse: 69  Temp: 97.8 F (36.6 C)  Resp: 14    Physical Exam  Constitutional: Appears well-developed and well-nourished. No distress.  HENT: Normocephalic. External right and left ear normal. Oropharynx is clear and moist.  Eyes: Conjunctivae and EOM are normal. PERRLA, no scleral icterus.  Neck: Normal ROM. Neck supple. No JVD. No tracheal deviation. No thyromegaly.  CVS: RRR, S1/S2 +, no murmurs, no gallops, no carotid bruit.  Pulmonary: Effort and breath sounds normal, no stridor, rhonchi, wheezes, rales.  Abdominal: Soft. BS +,  no distension, tenderness, rebound or guarding.  Musculoskeletal: Normal range of motion. No edema and no tenderness.  Lymphadenopathy: No lymphadenopathy noted, cervical, inguinal. Neuro: Alert. Normal reflexes, muscle tone coordination. No cranial nerve deficit. Skin: Skin is warm and dry. No rash noted. Not diaphoretic. No erythema. No pallor.  Psychiatric: Normal mood and affect. Behavior, judgment, thought content normal.   Lab Results  Component Value Date   WBC 7.4 02/11/2013   HGB 10.7* 02/11/2013   HCT 32.5* 02/11/2013   MCV 84.9 02/11/2013   PLT 261 02/11/2013   Lab Results  Component Value Date   CREATININE 0.9 03/01/2013   BUN 10 03/01/2013   NA 138 03/01/2013   K 4.1 03/01/2013   CL 105 03/01/2013   CO2 27 03/01/2013    Lab Results  Component Value Date   HGBA1C 5.9* 02/06/2013   Lipid Panel     Component Value Date/Time   CHOL 152 03/01/2013 0905   TRIG 69.0 03/01/2013 0905   HDL 44.00 03/01/2013 0905   CHOLHDL 3 03/01/2013 0905   VLDL 13.8 03/01/2013 0905   LDLCALC 94 03/01/2013 0905       Assessment and plan:   Patient Active Problem List   Diagnosis Date Noted  . Hyperlipidemia 02/11/2013  .  CAD (coronary artery disease) 02/11/2013  . Tobacco abuse 02/10/2013  . Polysubstance abuse:  Cocaine, marijuana 02/10/2013  . Thyroid dysfunction 02/10/2013  . NSTEMI (non-ST elevated myocardial infarction) 02/06/2013  . Breast lump on right side at 9 o'clock position 10/13/2012  . Breast lump on left side at 3 o'clock position 06/09/2012    Non-ST elevation MI Seen by cardiology yesterday Orthostatics were negative Patient denies cocaine use Norvasc was discontinued yesterday because of mild peripheral edema She is supposed to continue with the rest of her medications Followup in 2 weeks with cardiology   Polysubstance abuse Patient denies any ongoing drug use  Dyslipidemia continue statin  Suppressed TSH Free T4 was normal on 1/12 We'll repeat  Establish care Patient has a history of breast lump and had a mammogram coming Friday, she states that  she gets a mammogram every 6 months Gynecology referral for a Pap smear Follow up in 2 months    The patient was given clear instructions to go to ER or return to medical center if symptoms don't improve, worsen or new problems develop. The patient verbalized understanding. The patient was told to call to get any lab results if not heard anything in the next week.

## 2013-03-03 LAB — CBC WITH DIFFERENTIAL/PLATELET
Basophils Absolute: 0.1 10*3/uL (ref 0.0–0.1)
Basophils Relative: 1 % (ref 0–1)
Eosinophils Absolute: 0.4 10*3/uL (ref 0.0–0.7)
Eosinophils Relative: 6 % — ABNORMAL HIGH (ref 0–5)
HEMATOCRIT: 37.4 % (ref 36.0–46.0)
HEMOGLOBIN: 11.9 g/dL — AB (ref 12.0–15.0)
LYMPHS PCT: 44 % (ref 12–46)
Lymphs Abs: 2.8 10*3/uL (ref 0.7–4.0)
MCH: 27.8 pg (ref 26.0–34.0)
MCHC: 31.8 g/dL (ref 30.0–36.0)
MCV: 87.4 fL (ref 78.0–100.0)
MONO ABS: 0.6 10*3/uL (ref 0.1–1.0)
MONOS PCT: 10 % (ref 3–12)
Neutro Abs: 2.5 10*3/uL (ref 1.7–7.7)
Neutrophils Relative %: 39 % — ABNORMAL LOW (ref 43–77)
Platelets: 286 10*3/uL (ref 150–400)
RBC: 4.28 MIL/uL (ref 3.87–5.11)
RDW: 14.6 % (ref 11.5–15.5)
WBC: 6.4 10*3/uL (ref 4.0–10.5)

## 2013-03-03 LAB — T4, FREE: Free T4: 1.13 ng/dL (ref 0.80–1.80)

## 2013-03-03 LAB — VITAMIN D 25 HYDROXY (VIT D DEFICIENCY, FRACTURES): Vit D, 25-Hydroxy: 19 ng/mL — ABNORMAL LOW (ref 30–89)

## 2013-03-04 ENCOUNTER — Telehealth: Payer: Self-pay | Admitting: *Deleted

## 2013-03-04 MED ORDER — VITAMIN D (ERGOCALCIFEROL) 1.25 MG (50000 UNIT) PO CAPS: 50000.0000 [IU] | ORAL_CAPSULE | ORAL | Status: DC

## 2013-03-04 NOTE — Telephone Encounter (Signed)
Message copied by Katiria Calame, [place] R on Thu Mar 04, 2013  3:41 PM ------      Message from: Allyson Sabal MD, [facility]      Created: Wed Mar 03, 2013  2:05 PM       Notify patient the vitamin D level is low, please call in a prescription for vitamin D 50,000 units weekly, 10 tablets with 0 refills ------

## 2013-03-10 ENCOUNTER — Ambulatory Visit: Payer: No Typology Code available for payment source | Attending: Internal Medicine

## 2013-03-11 ENCOUNTER — Encounter (HOSPITAL_COMMUNITY)
Admission: RE | Admit: 2013-03-11 | Discharge: 2013-03-11 | Disposition: A | Discharge status: Home / Self Care | Payer: No Typology Code available for payment source | Source: Ambulatory Visit | Attending: Cardiology | Admitting: Cardiology

## 2013-03-11 DIAGNOSIS — F1410 Cocaine abuse, uncomplicated: Secondary | ICD-10-CM | POA: Insufficient documentation

## 2013-03-11 DIAGNOSIS — F17200 Nicotine dependence, unspecified, uncomplicated: Secondary | ICD-10-CM | POA: Insufficient documentation

## 2013-03-11 DIAGNOSIS — Z5189 Encounter for other specified aftercare: Secondary | ICD-10-CM | POA: Insufficient documentation

## 2013-03-11 DIAGNOSIS — E876 Hypokalemia: Secondary | ICD-10-CM | POA: Insufficient documentation

## 2013-03-11 DIAGNOSIS — E785 Hyperlipidemia, unspecified: Secondary | ICD-10-CM | POA: Insufficient documentation

## 2013-03-11 DIAGNOSIS — I2510 Atherosclerotic heart disease of native coronary artery without angina pectoris: Secondary | ICD-10-CM | POA: Insufficient documentation

## 2013-03-11 DIAGNOSIS — E079 Disorder of thyroid, unspecified: Secondary | ICD-10-CM | POA: Insufficient documentation

## 2013-03-11 DIAGNOSIS — F1210 Cannabis abuse, uncomplicated: Secondary | ICD-10-CM | POA: Insufficient documentation

## 2013-03-11 DIAGNOSIS — I214 Non-ST elevation (NSTEMI) myocardial infarction: Secondary | ICD-10-CM | POA: Insufficient documentation

## 2013-03-11 NOTE — Progress Notes (Signed)
Cardiac Rehab Medication Review by a Pharmacist  Does the patient  feel that his/her medications are working for him/her?  yes  Has the patient been experiencing any side effects to the medications prescribed?  no  Does the patient measure his/her own blood pressure or blood glucose at home?  no   Does the patient have any problems obtaining medications due to transportation or finances?   Yes - sometimes (transportation)  Understanding of regimen: excellent Understanding of indications: good Potential of compliance: excellent    Pharmacist comments: [name] [name] demonstrates a good understanding of her regimen. She describes great adherence, saying she never misses a pill. All questions were answered during this session.    [name] [name] 03/11/2013 8:59 AM

## 2013-03-12 DIAGNOSIS — F1910 Other psychoactive substance abuse, uncomplicated: Secondary | ICD-10-CM

## 2013-03-12 DIAGNOSIS — I214 Non-ST elevation (NSTEMI) myocardial infarction: Secondary | ICD-10-CM

## 2013-03-12 DIAGNOSIS — E785 Hyperlipidemia, unspecified: Secondary | ICD-10-CM

## 2013-03-12 DIAGNOSIS — I2510 Atherosclerotic heart disease of native coronary artery without angina pectoris: Secondary | ICD-10-CM

## 2013-03-15 ENCOUNTER — Encounter (HOSPITAL_COMMUNITY)
Admission: RE | Admit: 2013-03-15 | Discharge: 2013-03-15 | Disposition: A | Discharge status: Home / Self Care | Payer: No Typology Code available for payment source | Source: Ambulatory Visit | Attending: Cardiology | Admitting: Cardiology

## 2013-03-15 NOTE — Progress Notes (Signed)
Pt in today for her first day of exercise at the 11:15 exercise class time.  Pt with initial low bp which responded well to h20 and pb crackers.  Pt had eaten a light breakfast and declined offered gatorade.  Monitor shows sr with no noted ectopy.  Pt's short term goal include to eat better and long term to come off some meds.  Will continue to monitor pt progress toward this goal and reemphasize not all her meds will be indefinitely and some are prevented and to slow the progression of heart disease.  Monitor shows sr with no ectopy.  Cherre Huger, BSN

## 2013-03-17 ENCOUNTER — Encounter (HOSPITAL_COMMUNITY): Payer: Self-pay

## 2013-03-19 ENCOUNTER — Encounter (HOSPITAL_COMMUNITY)
Admission: RE | Admit: 2013-03-19 | Discharge: 2013-03-19 | Disposition: A | Discharge status: Home / Self Care | Payer: No Typology Code available for payment source | Source: Ambulatory Visit | Attending: Cardiology | Admitting: Cardiology

## 2013-03-19 NOTE — Progress Notes (Signed)
Pt noted to be overly fatigued and not engaging with class participants.  Talked with pt who readily reported history of feeling depressed and had the diagnosis two years ago.  Pt was on celexa but once she lost her job she could no longer financially afford to take the medication.  Pt did self medicate herself during that time.  Pt expressed desire to reconnect with a primary Md and also more importantly would like counseling support.  In basket sent to Dr. Meda Coffee. Called outpatient behavioral health to determine what resources are available and whether insurance is required.  Pt presently able to participate due to receiving financial services for  Indigent patient care.  Message left on voice mail.  Will continue to explore services to help this pt receive the counseling support she needs.  Cherre Huger, BSN

## 2013-03-22 ENCOUNTER — Encounter (HOSPITAL_COMMUNITY)
Admission: RE | Admit: 2013-03-22 | Discharge: 2013-03-22 | Disposition: A | Discharge status: Home / Self Care | Payer: No Typology Code available for payment source | Source: Ambulatory Visit | Attending: Cardiology | Admitting: Cardiology

## 2013-03-22 NOTE — Progress Notes (Signed)
Reviewed home exercise with pt today.  Pt plans to walk at home for exercise.  Reviewed THR, pulse, RPE, sign and symptoms, NTG use, and when to call 911 or MD.  Pt voiced understanding. Jessica Hawkins, MA, ACSM RCEP   

## 2013-03-22 NOTE — Progress Notes (Signed)
Contacted outpatient behavioral health with [facility].  Pt informed of appt earliest appt available is March 26. Pt is on the cancellation list.  Pt to expect paperwork that will need to be completed prior to the appt. Pt ok to be seen with the financial assist that she is qualified through 08/29/13.

## 2013-03-24 ENCOUNTER — Encounter (HOSPITAL_COMMUNITY): Admission: RE | Admit: 2013-03-24 | Payer: Self-pay | Source: Ambulatory Visit

## 2013-03-24 ENCOUNTER — Telehealth (HOSPITAL_COMMUNITY): Payer: Self-pay | Admitting: *Deleted

## 2013-03-24 NOTE — Telephone Encounter (Signed)
Pt called to inform staff that she would be absent today.  She has an 1100 appt with employment agency.

## 2013-03-26 ENCOUNTER — Encounter (HOSPITAL_COMMUNITY): Payer: Self-pay

## 2013-03-29 ENCOUNTER — Encounter (HOSPITAL_COMMUNITY)
Admission: RE | Admit: 2013-03-29 | Discharge: 2013-03-29 | Disposition: A | Discharge status: Home / Self Care | Payer: No Typology Code available for payment source | Source: Ambulatory Visit | Attending: Cardiology | Admitting: Cardiology

## 2013-03-29 DIAGNOSIS — E079 Disorder of thyroid, unspecified: Secondary | ICD-10-CM | POA: Insufficient documentation

## 2013-03-29 DIAGNOSIS — F17200 Nicotine dependence, unspecified, uncomplicated: Secondary | ICD-10-CM | POA: Insufficient documentation

## 2013-03-29 DIAGNOSIS — F1410 Cocaine abuse, uncomplicated: Secondary | ICD-10-CM | POA: Insufficient documentation

## 2013-03-29 DIAGNOSIS — Z5189 Encounter for other specified aftercare: Secondary | ICD-10-CM | POA: Insufficient documentation

## 2013-03-29 DIAGNOSIS — I214 Non-ST elevation (NSTEMI) myocardial infarction: Secondary | ICD-10-CM | POA: Insufficient documentation

## 2013-03-29 DIAGNOSIS — I2510 Atherosclerotic heart disease of native coronary artery without angina pectoris: Secondary | ICD-10-CM | POA: Insufficient documentation

## 2013-03-29 DIAGNOSIS — E876 Hypokalemia: Secondary | ICD-10-CM | POA: Insufficient documentation

## 2013-03-29 DIAGNOSIS — F1210 Cannabis abuse, uncomplicated: Secondary | ICD-10-CM | POA: Insufficient documentation

## 2013-03-29 DIAGNOSIS — E785 Hyperlipidemia, unspecified: Secondary | ICD-10-CM | POA: Insufficient documentation

## 2013-03-29 NOTE — Progress Notes (Signed)
Pt returned to rehab today.  Pt complains that she feels sob with exertion and swelling in her hands,feet. Lungs clear bilaterally, O2 sat 98-99%. Weight elevation of .5kg.  Pt concerned she might have heart failure.  Reviewed medications with pt.  Pt is not on a diuretic. Dr. Francesca Oman office called.  Relayed assessment and pt desire to be seen by someone. Dr. Meda Coffee is working on getting her a primary MD within the [facility] system.  Pt has financial assistance tor 100% through patient Indigent fund.  Appt made for 9:30 to be seen by Truitt Merle.  Pt verbalized understanding and is in agreement of this. Will send pt's rehab report for review attention  Roland Earl.  Cherre Huger, BSN

## 2013-03-30 ENCOUNTER — Ambulatory Visit (INDEPENDENT_AMBULATORY_CARE_PROVIDER_SITE_OTHER): Payer: No Typology Code available for payment source | Admitting: Nurse Practitioner

## 2013-03-30 ENCOUNTER — Other Ambulatory Visit: Payer: Self-pay | Admitting: *Deleted

## 2013-03-30 ENCOUNTER — Encounter (INDEPENDENT_AMBULATORY_CARE_PROVIDER_SITE_OTHER): Payer: Self-pay

## 2013-03-30 ENCOUNTER — Encounter: Payer: Self-pay | Admitting: Nurse Practitioner

## 2013-03-30 VITALS — BP 120/72 | HR 75 | Ht 63.0 in | Wt 136.0 lb

## 2013-03-30 DIAGNOSIS — R0600 Dyspnea, unspecified: Secondary | ICD-10-CM

## 2013-03-30 DIAGNOSIS — R0609 Other forms of dyspnea: Secondary | ICD-10-CM

## 2013-03-30 DIAGNOSIS — E876 Hypokalemia: Secondary | ICD-10-CM

## 2013-03-30 DIAGNOSIS — R0989 Other specified symptoms and signs involving the circulatory and respiratory systems: Secondary | ICD-10-CM

## 2013-03-30 DIAGNOSIS — R0602 Shortness of breath: Secondary | ICD-10-CM

## 2013-03-30 LAB — BASIC METABOLIC PANEL
BUN: 11 mg/dL (ref 6–23)
CO2: 28 mEq/L (ref 19–32)
Calcium: 9.4 mg/dL (ref 8.4–10.5)
Chloride: 103 mEq/L (ref 96–112)
Creatinine, Ser: 1 mg/dL (ref 0.4–1.2)
GFR: 75.84 mL/min (ref >60.00)
Glucose, Bld: 111 mg/dL — ABNORMAL HIGH (ref 70–99)
Potassium: 3.3 mEq/L — ABNORMAL LOW (ref 3.5–5.1)
Sodium: 138 mEq/L (ref 135–145)

## 2013-03-30 LAB — BRAIN NATRIURETIC PEPTIDE: Pro B Natriuretic peptide (BNP): 32 pg/mL (ref 0.0–100.0)

## 2013-03-30 MED ORDER — POTASSIUM CHLORIDE CRYS ER 20 MEQ PO TBCR: 20.0000 meq | EXTENDED_RELEASE_TABLET | Freq: Every day | ORAL | Status: DC

## 2013-03-30 MED ORDER — FUROSEMIDE 20 MG PO TABS: 20.0000 mg | ORAL_TABLET | Freq: Every day | ORAL | Status: DC | PRN

## 2013-03-30 NOTE — Progress Notes (Signed)
[name] [name] Date of Birth: 1965/02/05 Medical Record #[id number]  History of Present Illness: [name] [name] is seen back today for a work in visit. Seen for Dr. Meda Coffee. 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 She did not require PCI. EF was normal. Other issues as noted.   Has been in cardiac rehab - noted more swelling and dyspnea and wished to be seen.   Comes back today. Here alone. Sister did accompany her to the office but not to the room due to last OV note mentioning that she did not wish to have substance abuse issues discussed with anyone else present. She notes more shortness of breath and swelling. Does go down somewhat overnight. Can't wear her rings anymore. Did smoke for 30+ years. Denies smoking or drug use. Says she is staying clean. No longer on Norvasc - BP ok and she is no longer dizzy. Has so extra salt but trying to restrict.   Current Outpatient Prescriptions  Medication Sig Dispense Refill  . acetaminophen (TYLENOL) 500 MG tablet Take 1,000 mg by mouth every 6 (six) hours as needed for mild pain.      Marland Kitchen aspirin EC 81 MG EC tablet Take 1 tablet (81 mg total) by mouth daily.      . carvedilol (COREG) 3.125 MG tablet Take 1 tablet (3.125 mg total) by mouth 2 (two) times daily with a meal.  68 tablet  0  . clopidogrel (PLAVIX) 75 MG tablet Take 1 tablet (75 mg total) by mouth daily with breakfast.  34 tablet  0  . loratadine (CLARITIN) 10 MG tablet Take 10 mg by mouth daily.      . nitroGLYCERIN (NITROSTAT) 0.4 MG SL tablet Place 1 tablet (0.4 mg total) under the tongue every 5 (five) minutes x 3 doses as needed for chest pain.  25 tablet  3  . pravastatin (PRAVACHOL) 40 MG tablet Take 1 tablet (40 mg total) by mouth daily.  34 tablet  0  . Vitamin D, Ergocalciferol, (DRISDOL) 50000 UNITS CAPS capsule Take 1 capsule (50,000 Units total) by mouth every 7 (seven) days.  10 capsule  0   No current facility-administered medications for this visit.     Allergies  Allergen Reactions  . Demerol Itching  . Novocain [Procaine Hcl] Swelling    Past Medical History  Diagnosis Date  . Abnormal Pap smear     Colpo (twice)  . Anemia   . Chlamydia     years ago  . Trichomonas     years ago  . Breast disorder     soreness in left breast   . Depression   . CAD (coronary artery disease)     a. 01/2013 NSTEMI/Cath: LM nl, LAD 47m, LCX nl, RI nl, RCA nl, PDA ostial nonocclusive intraluminal thrombus, EF 65%, Treated with heparin x 48 hrs->Med Rx (occurred in setting of + cocaine);  b. 01/2013 Echo: EF 60-65%, nl wall motion.  . Polysubstance abuse     tobacco/cocaine/marijuana (+ UDS 01/2013).    Past Surgical History  Procedure Laterality Date  . Cesarean section      2 c-sections  . Breast surgery      lump removed from right breast  . Nova sure    . Groin mass open biopsy    . Cervical cone biopsy      History  Smoking status  . Former Smoker -- 0.50 packs/day for 28 years  . Types: Cigarettes  Smokeless  tobacco  . Never Used    Comment: February 05, 2013    History  Alcohol Use  . Yes    Comment: rarely    Family History  Problem Relation Age of Onset  . Breast cancer Mother   . Hypertension Brother   . Kidney failure Brother   . Hypertension Sister     Review of Systems: The review of systems is per the HPI.  All other systems were reviewed and are negative.  Physical Exam: BP 120/72  Pulse 75  Ht 5\' 3"  (1.6 m)  Wt 136 lb (61.689 kg)  BMI 24.10 kg/m2  SpO2 96% Patient is pleasant and in no acute distress. Skin is warm and dry. Color is normal.  HEENT is unremarkable. Normocephalic/atraumatic. PERRL. Sclera are nonicteric. Neck is supple. No masses. No JVD. Lungs are clear. Cardiac exam shows a regular rate and rhythm. Abdomen is soft. Extremities are with trace lower extremity edema. Gait and ROM are intact. No gross neurologic deficits noted.  Wt Readings from Last 3 Encounters:  03/30/13 136 lb  (61.689 kg)  03/11/13 137 lb 9.1 oz (62.4 kg)  03/02/13 132 lb 6.4 oz (60.056 kg)     LABORATORY DATA: PENDING  Lab Results  Component Value Date   WBC 6.4 03/02/2013   HGB 11.9* 03/02/2013   HCT 37.4 03/02/2013   PLT 286 03/02/2013   GLUCOSE 92 03/01/2013   CHOL 152 03/01/2013   TRIG 69.0 03/01/2013   HDL 44.00 03/01/2013   LDLCALC 94 03/01/2013   ALT 24 03/01/2013   ALT 24 03/01/2013   AST 18 03/01/2013   AST 18 03/01/2013   NA 138 03/01/2013   K 4.1 03/01/2013   CL 105 03/01/2013   CREATININE 0.9 03/01/2013   BUN 10 03/01/2013   CO2 27 03/01/2013   TSH 0.32* 03/01/2013   INR 1.13 02/06/2013   HGBA1C 5.9* 02/06/2013     Assessment / Plan: 1. Swelling - has a normal EF by cath and echo - do not think we need to repeat the echo at this time - will try support stockings, salt restriction and prn Lasix 20 mg if those measures fail to improve. Will check BNP and BMET today. If BNP normal would refer for PFTS - she has had a long smoking history.   2. NSTEMI - with nonocclusive intraluminal thrombus - managed with heparin and remains on chronic Plavix - no recurrent chest pain.  3. HLD - labs from last month reviewed with her. She has had a nice response to her statin.   See her back as planned in April.  Patient is agreeable to this plan and will call if any problems develop in the interim.   Burtis Junes, RN, [place] [address] [place], [place]  [id number] [phone]

## 2013-03-30 NOTE — Patient Instructions (Signed)
We will check lab today to recheck your potassium and a fluid level test that tells [place] if your shortness of breath is coming more from your heart or lungs.  If this is normal - we will send you for a breathing test  Really restrict your salt  Elevate your legs as needed  Support stockings are recommended   I have sent in a prescription for Lasix 20 mg to just take as needed for swelling that does not go away with the above measures.  Call the [place] office at [phone] if you have any questions, problems or concerns.

## 2013-03-31 ENCOUNTER — Encounter (HOSPITAL_COMMUNITY)
Admission: RE | Admit: 2013-03-31 | Discharge: 2013-03-31 | Disposition: A | Discharge status: Home / Self Care | Payer: No Typology Code available for payment source | Source: Ambulatory Visit | Attending: Cardiology | Admitting: Cardiology

## 2013-04-02 ENCOUNTER — Encounter (HOSPITAL_COMMUNITY)
Admission: RE | Admit: 2013-04-02 | Discharge: 2013-04-02 | Disposition: A | Discharge status: Home / Self Care | Payer: No Typology Code available for payment source | Source: Ambulatory Visit | Attending: Cardiology | Admitting: Cardiology

## 2013-04-02 NOTE — Progress Notes (Signed)
Reviewed QOL survey results.  Pt with significant low scores in all areas.  Health and function - 12.10; socioeconomic 8.13; Physical and spiritual 12.00 and Family 13.0.  Scores less than 21.0 are considered low.  Pt feels she is ready to to accept the responsibility of her recovery and feels she is on the right path.  Pt is hopeful that her upcoming appt with behavioral heath will be productive and she will receive ongoing counseling support.  Dr. Meda Coffee continues to work on finding pt a primary physcian within the [place]

## 2013-04-05 ENCOUNTER — Encounter (HOSPITAL_COMMUNITY): Admission: RE | Admit: 2013-04-05 | Payer: Self-pay | Source: Ambulatory Visit

## 2013-04-05 ENCOUNTER — Telehealth (HOSPITAL_COMMUNITY): Payer: Self-pay | Admitting: Cardiac Rehabilitation

## 2013-04-05 ENCOUNTER — Telehealth (HOSPITAL_COMMUNITY): Payer: Self-pay | Admitting: *Deleted

## 2013-04-05 NOTE — Telephone Encounter (Signed)
Unable to leave message checking on pt to see how she was feeling

## 2013-04-05 NOTE — Telephone Encounter (Signed)
Called out today not feeling well to rehab staff - [address].  Pt reported that she took a NTG and was going to take it easy

## 2013-04-07 ENCOUNTER — Encounter (HOSPITAL_COMMUNITY)
Admission: RE | Admit: 2013-04-07 | Discharge: 2013-04-07 | Disposition: A | Discharge status: Home / Self Care | Payer: No Typology Code available for payment source | Source: Ambulatory Visit | Attending: Cardiology | Admitting: Cardiology

## 2013-04-08 ENCOUNTER — Other Ambulatory Visit (INDEPENDENT_AMBULATORY_CARE_PROVIDER_SITE_OTHER): Payer: No Typology Code available for payment source

## 2013-04-08 DIAGNOSIS — E876 Hypokalemia: Secondary | ICD-10-CM

## 2013-04-08 LAB — BASIC METABOLIC PANEL
BUN: 10 mg/dL (ref 6–23)
CO2: 30 mEq/L (ref 19–32)
Calcium: 9.5 mg/dL (ref 8.4–10.5)
Chloride: 106 mEq/L (ref 96–112)
Creatinine, Ser: 0.9 mg/dL (ref 0.4–1.2)
GFR: 88.9 mL/min (ref >60.00)
Glucose, Bld: 112 mg/dL — ABNORMAL HIGH (ref 70–99)
Potassium: 3.8 mEq/L (ref 3.5–5.1)
Sodium: 139 mEq/L (ref 135–145)

## 2013-04-09 ENCOUNTER — Telehealth (HOSPITAL_COMMUNITY): Payer: Self-pay | Admitting: *Deleted

## 2013-04-09 ENCOUNTER — Other Ambulatory Visit: Payer: Self-pay | Admitting: *Deleted

## 2013-04-09 ENCOUNTER — Encounter (HOSPITAL_COMMUNITY)
Admission: RE | Admit: 2013-04-09 | Discharge: 2013-04-09 | Disposition: A | Discharge status: Home / Self Care | Payer: No Typology Code available for payment source | Source: Ambulatory Visit | Attending: Cardiology | Admitting: Cardiology

## 2013-04-09 DIAGNOSIS — E876 Hypokalemia: Secondary | ICD-10-CM

## 2013-04-09 NOTE — Telephone Encounter (Signed)
Message copied by Rowe Pavy on Fri Apr 09, 2013  1:51 PM ------      Message from: Dorothy Spark      Created: Fri Apr 09, 2013  1:19 PM      Regarding: RE: [name] Brilliant to drive?       She is ok to drive            ----- Message -----         From: Rowe Pavy, RN         Sent: 04/09/2013  11:52 AM           To: Dorothy Spark, MD      Subject: Ok to drive?                                             Pt is doing well in exercise s/p NSTEMI  PDA rupture on 02/05/13.  Pt in for rehab today session 8 and asked about clearance to drive.  Pt's family will not let her drive until she is cleared by you.            May pt resume driving?            Thanks [employer] RN       ------

## 2013-04-09 NOTE — Telephone Encounter (Signed)
Pt informed that she is cleared to drive by Dr Meda Coffee.  Pt excited that she can drive.

## 2013-04-12 ENCOUNTER — Encounter (HOSPITAL_COMMUNITY)
Admission: RE | Admit: 2013-04-12 | Discharge: 2013-04-12 | Disposition: A | Discharge status: Home / Self Care | Payer: No Typology Code available for payment source | Source: Ambulatory Visit | Attending: Cardiology | Admitting: Cardiology

## 2013-04-12 NOTE — Progress Notes (Signed)
[name] [name] 48 y.o. female Nutrition Note Spoke with pt.  Nutrition Plan and Nutrition Survey goals reviewed with pt. Pt is following Step 2 of the Therapeutic Lifestyle Changes diet. Pt with financial difficulty buying food according to nutrition survey. Per pt, her financial situation buying food has improved "since they increased my food stamps." According to pt's A1c, pt is pre-diabetic. Pre-diabetes discussed. Pt states she has a family h/o DM. Pt encouraged to discuss pre-diabetes with her PCP when she gets one. Pt expressed understanding of the information reviewed. Pt aware of nutrition education classes offered and is unsure if she will be able to attend.  Nutrition Diagnosis   Food-and nutrition-related knowledge deficit related to lack of exposure to information as related to diagnosis of: ? CVD ? Pre-DM (A1c 5.9)   Nutrition RX/ Estimated Daily Nutrition Needs for: wt maintenance 1750-2000 Kcal, 55-65 gm fat, 11-13 gm sat fat, 1.7-2.0 gm trans-fat, <1500 mg sodium   Nutrition Intervention   Pt's individual nutrition plan including cholesterol goals reviewed with pt.   Benefits of adopting Therapeutic Lifestyle Changes discussed when Medficts reviewed.   Pt to attend the Portion Distortion class    Pt to attend the  ? Nutrition I class                     ? Nutrition II class   Pt given handouts for: ? Nutrition I class ? Nutrition II class   Continue client-centered nutrition education by RD, as part of interdisciplinary care.  Goal(s)   Pt to describe the benefit of including fruits, vegetables, whole grains, and low-fat dairy products in a heart healthy meal plan.  Monitor and Evaluate progress toward nutrition goal with team. Nutrition Risk:  Low   Derek Mound, M.Ed, RD, LDN, CDE 04/12/2013 1:33 PM

## 2013-04-14 ENCOUNTER — Telehealth: Payer: Self-pay | Admitting: *Deleted

## 2013-04-14 ENCOUNTER — Encounter (HOSPITAL_COMMUNITY)
Admission: RE | Admit: 2013-04-14 | Discharge: 2013-04-14 | Disposition: A | Discharge status: Home / Self Care | Payer: No Typology Code available for payment source | Source: Ambulatory Visit | Attending: Cardiology | Admitting: Cardiology

## 2013-04-14 NOTE — Telephone Encounter (Signed)
**Note De-Identified Keshun Berrett Obfuscation** Dr Meda Coffee is out of the office until 3/24. If this needs to be addressed before then please refer to DOD.

## 2013-04-14 NOTE — Telephone Encounter (Signed)
Pharmacist from the [facility] called to see if pravastatin could be changed to simvastatin due to the patient being un-insured. Please advise. Thanks, MI

## 2013-04-15 NOTE — Telephone Encounter (Signed)
Dr Irish Lack, I am sending this to you for review in Dr Thera Flake absence. Please advise. Thanks, MI

## 2013-04-16 ENCOUNTER — Telehealth: Payer: Self-pay

## 2013-04-16 ENCOUNTER — Encounter (HOSPITAL_COMMUNITY): Payer: Self-pay

## 2013-04-16 NOTE — Telephone Encounter (Signed)
Error

## 2013-04-16 NOTE — Telephone Encounter (Signed)
That would you perfectly fine. The patient would need liver function test 3-4 weeks after starting simvastatin. Ena Dawley

## 2013-04-16 NOTE — Telephone Encounter (Signed)
Pharmacy stated that they went ahead and refilled the pravastatin, but will change it to simvastatin for the next refill.

## 2013-04-19 ENCOUNTER — Telehealth (HOSPITAL_COMMUNITY): Payer: Self-pay | Admitting: *Deleted

## 2013-04-19 ENCOUNTER — Encounter (HOSPITAL_COMMUNITY)
Admission: RE | Admit: 2013-04-19 | Discharge: 2013-04-19 | Disposition: A | Discharge status: Home / Self Care | Payer: No Typology Code available for payment source | Source: Ambulatory Visit | Attending: Cardiology | Admitting: Cardiology

## 2013-04-19 NOTE — Telephone Encounter (Signed)
That's ok, would you let them know?

## 2013-04-19 NOTE — Progress Notes (Signed)
[name] [name] told me this morning she took her evening medications this morning. I called the patient his afternoon and reviewed her medications over the phone this afternoon.

## 2013-04-19 NOTE — Telephone Encounter (Signed)
Which one and how often

## 2013-04-20 ENCOUNTER — Telehealth: Payer: Self-pay | Admitting: Cardiology

## 2013-04-20 ENCOUNTER — Other Ambulatory Visit: Payer: Self-pay

## 2013-04-20 DIAGNOSIS — R609 Edema, unspecified: Secondary | ICD-10-CM

## 2013-04-20 DIAGNOSIS — R0602 Shortness of breath: Secondary | ICD-10-CM

## 2013-04-20 NOTE — Telephone Encounter (Signed)
Per the pts request I called [facility] to see if I could get the pts test done sooner than May. I was advised that I could re schedule the test to be done at [facility] as [facility] does not have anytime sooner than May. The pt is in agreement so I re ordered the PFT's to be done at [facility] and sent a message to [facility] to arrange. The pt is aware that someone from this office will be contacting her concerning appointment.

## 2013-04-20 NOTE — Telephone Encounter (Signed)
Patient changed pharmacy so she could get pravastatin filled

## 2013-04-20 NOTE — Telephone Encounter (Signed)
New message  Pt called. She states that she is is experiencing SOB more frequently.Marland Kitchen She was advised per [facility] that her appt for 04/30/2013 was canceled due to a holiday and the next appt is in may. She states that is to long to wait and request a call back to address her breathing. Please assist

## 2013-04-21 ENCOUNTER — Encounter (HOSPITAL_COMMUNITY): Payer: Self-pay

## 2013-04-21 ENCOUNTER — Telehealth (HOSPITAL_COMMUNITY): Payer: Self-pay | Admitting: General Practice

## 2013-04-22 ENCOUNTER — Ambulatory Visit (HOSPITAL_COMMUNITY): Payer: Self-pay | Admitting: Psychiatry

## 2013-04-23 ENCOUNTER — Encounter (HOSPITAL_COMMUNITY)
Admission: RE | Admit: 2013-04-23 | Discharge: 2013-04-23 | Disposition: A | Discharge status: Home / Self Care | Payer: No Typology Code available for payment source | Source: Ambulatory Visit | Attending: Cardiology | Admitting: Cardiology

## 2013-04-23 ENCOUNTER — Telehealth: Payer: Self-pay | Admitting: *Deleted

## 2013-04-23 NOTE — Telephone Encounter (Signed)
New message    Patient is asking to keep 4/3 @ 9 am  @ [employer]. Appt. Asking to cancel 4/2 @ [facility].

## 2013-04-23 NOTE — Telephone Encounter (Signed)
S/w pt changed pft's to 4/3 which pt is agreeable at [facility] @ 9:00 am called  And s/w Revonda Humphrey @ [phone] is also agreeable to plan

## 2013-04-23 NOTE — Telephone Encounter (Signed)
S/w Revonda Humphrey from [facility] after Cecille Rubin received a message stating had to contact pt for instructions for PFT's, never seen one of these before so I called  [facility] and s/w Revonda Humphrey stated since we did not call  There office to schedule appt and it fell in there work que we are responsible for call pt since there volume is so high.  The appt was schedule twice Sharyn Lull cancelled one and asked to call pt back with the appt that was left,  which I did, when I called pt stated what happened to the other appt I was suppose to have when I had my rehab, I stated received this message and I'm giving you the date I received pt stated verbal understanding

## 2013-04-23 NOTE — Telephone Encounter (Signed)
Pt would like Danielle to know she cannot have pfts on 04/29/13.  It would be better for her to have them on 04/30/13   I will forward to [facility] to reschedule pft to 04/30/13 & call back to pt  Horton Chin RN

## 2013-04-26 ENCOUNTER — Encounter: Payer: Self-pay | Admitting: *Deleted

## 2013-04-26 ENCOUNTER — Encounter (HOSPITAL_COMMUNITY)
Admission: RE | Admit: 2013-04-26 | Discharge: 2013-04-26 | Disposition: A | Discharge status: Home / Self Care | Payer: No Typology Code available for payment source | Source: Ambulatory Visit | Attending: Cardiology | Admitting: Cardiology

## 2013-04-28 ENCOUNTER — Telehealth (HOSPITAL_COMMUNITY): Payer: Self-pay | Admitting: General Practice

## 2013-04-28 ENCOUNTER — Encounter (HOSPITAL_COMMUNITY): Payer: Self-pay

## 2013-04-29 ENCOUNTER — Telehealth: Payer: Self-pay | Admitting: Cardiology

## 2013-04-29 ENCOUNTER — Encounter (HOSPITAL_COMMUNITY): Payer: Self-pay

## 2013-04-29 NOTE — Telephone Encounter (Signed)
New problem    Pt needs a call back she has a question about an over the counter med she would like to take???

## 2013-04-29 NOTE — Telephone Encounter (Signed)
**Note De-Identified [name] [name] Obfuscation** Pt is advised that she may take Tylenol, Coricidin and Musinex for her cold like symptoms, she verbalized understanding.

## 2013-04-30 ENCOUNTER — Encounter (HOSPITAL_COMMUNITY): Payer: Self-pay

## 2013-04-30 ENCOUNTER — Telehealth (HOSPITAL_COMMUNITY): Payer: Self-pay | Admitting: *Deleted

## 2013-04-30 ENCOUNTER — Encounter (HOSPITAL_COMMUNITY): Admission: RE | Admit: 2013-04-30 | Payer: Self-pay | Source: Ambulatory Visit

## 2013-04-30 ENCOUNTER — Encounter (HOSPITAL_COMMUNITY): Payer: No Typology Code available for payment source

## 2013-04-30 NOTE — Telephone Encounter (Signed)
Pt called out today for exercise with complaint of cold.  Pt hopes to return to exercise on next Wednesday. Cherre Huger, BSN

## 2013-05-03 ENCOUNTER — Ambulatory Visit (INDEPENDENT_AMBULATORY_CARE_PROVIDER_SITE_OTHER): Payer: No Typology Code available for payment source | Admitting: Cardiology

## 2013-05-03 ENCOUNTER — Other Ambulatory Visit: Payer: No Typology Code available for payment source

## 2013-05-03 ENCOUNTER — Telehealth: Payer: Self-pay | Admitting: Cardiology

## 2013-05-03 ENCOUNTER — Telehealth (HOSPITAL_COMMUNITY): Payer: Self-pay | Admitting: *Deleted

## 2013-05-03 ENCOUNTER — Encounter (HOSPITAL_COMMUNITY): Payer: Self-pay

## 2013-05-03 ENCOUNTER — Encounter: Payer: Self-pay | Admitting: Cardiology

## 2013-05-03 ENCOUNTER — Ambulatory Visit: Payer: Self-pay | Admitting: Internal Medicine

## 2013-05-03 VITALS — BP 115/82 | HR 80 | Ht 63.0 in | Wt 134.1 lb

## 2013-05-03 DIAGNOSIS — E876 Hypokalemia: Secondary | ICD-10-CM

## 2013-05-03 DIAGNOSIS — I214 Non-ST elevation (NSTEMI) myocardial infarction: Secondary | ICD-10-CM

## 2013-05-03 LAB — COMPREHENSIVE METABOLIC PANEL
ALT: 18 U/L (ref 0–35)
AST: 17 U/L (ref 0–37)
Albumin: 3.7 g/dL (ref 3.5–5.2)
Alkaline Phosphatase: 64 U/L (ref 39–117)
BUN: 10 mg/dL (ref 6–23)
CO2: 29 mEq/L (ref 19–32)
Calcium: 9.2 mg/dL (ref 8.4–10.5)
Chloride: 102 mEq/L (ref 96–112)
Creatinine, Ser: 0.8 mg/dL (ref 0.4–1.2)
GFR: 93.77 mL/min (ref >60.00)
Glucose, Bld: 87 mg/dL (ref 70–99)
Potassium: 3.3 mEq/L — ABNORMAL LOW (ref 3.5–5.1)
Sodium: 137 mEq/L (ref 135–145)
Total Bilirubin: 0.4 mg/dL (ref 0.3–1.2)
Total Protein: 7.7 g/dL (ref 6.0–8.3)

## 2013-05-03 LAB — BASIC METABOLIC PANEL
BUN: 10 mg/dL (ref 6–23)
CO2: 29 mEq/L (ref 19–32)
Calcium: 9.2 mg/dL (ref 8.4–10.5)
Chloride: 102 mEq/L (ref 96–112)
Creatinine, Ser: 0.9 mg/dL (ref 0.4–1.2)
GFR: 88.87 mL/min (ref >60.00)
Glucose, Bld: 91 mg/dL (ref 70–99)
Potassium: 3.3 mEq/L — ABNORMAL LOW (ref 3.5–5.1)
Sodium: 138 mEq/L (ref 135–145)

## 2013-05-03 MED ORDER — POTASSIUM CHLORIDE CRYS ER 20 MEQ PO TBCR: EXTENDED_RELEASE_TABLET | ORAL | Status: DC

## 2013-05-03 NOTE — Telephone Encounter (Signed)
Pt notified. Meds updated.  

## 2013-05-03 NOTE — Patient Instructions (Signed)
Your physician recommends that you return for lab work today for CMET.  Your physician recommends that you continue on your current medications as directed. Please refer to the Current Medication list given to you today.  Your physician wants you to follow-up in: 6 months with Dr. Meda Coffee. You will receive a reminder letter in the mail two months in advance. If you don't receive a letter, please call our office to schedule the follow-up appointment.  Work note given to you today.

## 2013-05-03 NOTE — Telephone Encounter (Signed)
Message copied by Alcario Drought on Mon May 03, 2013  4:53 PM ------      Message from: Dorothy Spark      Created: Mon May 03, 2013  4:48 PM       Her K is 3.3, she needs to take 10 mEq of KCL daily for the next week and otherwise only in addition to Lasix (she takes it PRN).       Thank you,      K ------

## 2013-05-03 NOTE — Progress Notes (Signed)
Patient ID: [name] [name], female   DOB: Dec 08, 1965, 48 y.o.   MRN: [id number]    Calissa Swenor Date of Birth: 03/08/1965 Medical Record #[id number]  History of Present Illness: Ms. Hayman is seen back today for a work in visit. Seen for Dr. Meda Coffee. 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 She did not require PCI. EF was normal. Other issues as noted. Has been in cardiac rehab and did well.  Comes back today. Here alone. She states that her shortness of breath and lower extremity edema has resolved. She has been taking Lasix 20 mg daily as needed. She denies any arm chest pain, orthopnea, PND. No syncope. She states that occasionally she will have a few second lasting palpitations they're not associated with any other symptoms such as dizziness or shortness of breath.  Sister did accompany her to the office but not to the room due to last OV note mentioning that she did not wish to have substance abuse issues discussed with anyone else present.    Current Outpatient Prescriptions  Medication Sig Dispense Refill  . acetaminophen (TYLENOL) 500 MG tablet Take 1,000 mg by mouth every 6 (six) hours as needed for mild pain.      Marland Kitchen aspirin EC 81 MG EC tablet Take 1 tablet (81 mg total) by mouth daily.      . carvedilol (COREG) 3.125 MG tablet Take 1 tablet (3.125 mg total) by mouth 2 (two) times daily with a meal.  68 tablet  0  . clopidogrel (PLAVIX) 75 MG tablet Take 1 tablet (75 mg total) by mouth daily with breakfast.  34 tablet  0  . furosemide (LASIX) 20 MG tablet Take 1 tablet (20 mg total) by mouth daily as needed.  20 tablet  3  . loratadine (CLARITIN) 10 MG tablet Take 10 mg by mouth daily.      . nitroGLYCERIN (NITROSTAT) 0.4 MG SL tablet Place 1 tablet (0.4 mg total) under the tongue every 5 (five) minutes x 3 doses as needed for chest pain.  25 tablet  3  . pravastatin (PRAVACHOL) 40 MG tablet Take 1 tablet (40 mg total) by mouth daily.  34 tablet  0  . Vitamin D,  Ergocalciferol, (DRISDOL) 50000 UNITS CAPS capsule Take 1 capsule (50,000 Units total) by mouth every 7 (seven) days.  10 capsule  0  . potassium chloride SA (K-DUR,KLOR-CON) 20 MEQ tablet Take 1 tablet (20 mEq total) by mouth daily.  30 tablet  6   No current facility-administered medications for this visit.    Allergies  Allergen Reactions  . Demerol Itching  . Novocain [Procaine Hcl] Swelling    Past Medical History  Diagnosis Date  . Abnormal Pap smear     Colpo (twice)  . Anemia   . Chlamydia     years ago  . Trichomonas     years ago  . Breast disorder     soreness in left breast   . Depression   . CAD (coronary artery disease)     a. 01/2013 NSTEMI/Cath: LM nl, LAD 57m, LCX nl, RI nl, RCA nl, PDA ostial nonocclusive intraluminal thrombus, EF 65%, Treated with heparin x 48 hrs->Med Rx (occurred in setting of + cocaine);  b. 01/2013 Echo: EF 60-65%, nl wall motion.  . Polysubstance abuse     tobacco/cocaine/marijuana (+ UDS 01/2013).    Past Surgical History  Procedure Laterality Date  . Cesarean section      2  c-sections  . Breast surgery      lump removed from right breast  . Nova sure    . Groin mass open biopsy    . Cervical cone biopsy      History  Smoking status  . Former Smoker -- 0.50 packs/day for 28 years  . Types: Cigarettes  Smokeless tobacco  . Never Used    Comment: February 05, 2013    History  Alcohol Use  . Yes    Comment: rarely    Family History  Problem Relation Age of Onset  . Breast cancer Mother   . Hypertension Brother   . Kidney failure Brother   . Hypertension Sister     Review of Systems: The review of systems is per the HPI.  All other systems were reviewed and are negative.  Physical Exam: BP 115/82  Pulse 80  Ht 5\' 3"  (1.6 m)  Wt 134 lb 1.9 oz (60.836 kg)  BMI 23.76 kg/m2 Patient is pleasant and in no acute distress. Skin is warm and dry. Color is normal.  HEENT is unremarkable. Normocephalic/atraumatic. PERRL.  Sclera are nonicteric. Neck is supple. No masses. No JVD. Lungs are clear. Cardiac exam shows a regular rate and rhythm. Abdomen is soft. Extremities are with trace lower extremity edema. Gait and ROM are intact. No gross neurologic deficits noted.  Wt Readings from Last 3 Encounters:  05/03/13 134 lb 1.9 oz (60.836 kg)  03/30/13 136 lb (61.689 kg)  03/11/13 137 lb 9.1 oz (62.4 kg)     LABORATORY DATA: PENDING  Lab Results  Component Value Date   WBC 6.4 03/02/2013   HGB 11.9* 03/02/2013   HCT 37.4 03/02/2013   PLT 286 03/02/2013   GLUCOSE 112* 04/08/2013   CHOL 152 03/01/2013   TRIG 69.0 03/01/2013   HDL 44.00 03/01/2013   LDLCALC 94 03/01/2013   ALT 24 03/01/2013   ALT 24 03/01/2013   AST 18 03/01/2013   AST 18 03/01/2013   NA 139 04/08/2013   K 3.8 04/08/2013   CL 106 04/08/2013   CREATININE 0.9 04/08/2013   BUN 10 04/08/2013   CO2 30 04/08/2013   TSH 0.32* 03/01/2013   INR 1.13 02/06/2013   HGBA1C 5.9* 02/06/2013     Assessment / Plan:  1. NSTEMI - with nonocclusive intraluminal thrombus - managed with heparin and remains on chronic Plavix - no recurrent chest pain.  2. LE Swelling - resolved , we'll check CMP today, lasix PRN. BNP normal.  3. HLD - labs from last month reviewed with her. She has had a nice response to her statin. Check liver enzymes today  Follow up in 6 months.  Dorothy Spark 05/03/2013

## 2013-05-05 ENCOUNTER — Encounter (HOSPITAL_COMMUNITY)
Admission: RE | Admit: 2013-05-05 | Discharge: 2013-05-05 | Disposition: A | Discharge status: Home / Self Care | Payer: No Typology Code available for payment source | Source: Ambulatory Visit | Attending: Cardiology | Admitting: Cardiology

## 2013-05-05 DIAGNOSIS — F1410 Cocaine abuse, uncomplicated: Secondary | ICD-10-CM | POA: Insufficient documentation

## 2013-05-05 DIAGNOSIS — I214 Non-ST elevation (NSTEMI) myocardial infarction: Secondary | ICD-10-CM | POA: Insufficient documentation

## 2013-05-05 DIAGNOSIS — Z5189 Encounter for other specified aftercare: Secondary | ICD-10-CM | POA: Insufficient documentation

## 2013-05-05 DIAGNOSIS — E876 Hypokalemia: Secondary | ICD-10-CM | POA: Insufficient documentation

## 2013-05-05 DIAGNOSIS — E079 Disorder of thyroid, unspecified: Secondary | ICD-10-CM | POA: Insufficient documentation

## 2013-05-05 DIAGNOSIS — I2510 Atherosclerotic heart disease of native coronary artery without angina pectoris: Secondary | ICD-10-CM | POA: Insufficient documentation

## 2013-05-05 DIAGNOSIS — F17200 Nicotine dependence, unspecified, uncomplicated: Secondary | ICD-10-CM | POA: Insufficient documentation

## 2013-05-05 DIAGNOSIS — E785 Hyperlipidemia, unspecified: Secondary | ICD-10-CM | POA: Insufficient documentation

## 2013-05-05 DIAGNOSIS — F1210 Cannabis abuse, uncomplicated: Secondary | ICD-10-CM | POA: Insufficient documentation

## 2013-05-06 ENCOUNTER — Ambulatory Visit (HOSPITAL_COMMUNITY)
Admission: RE | Admit: 2013-05-06 | Discharge: 2013-05-06 | Disposition: A | Discharge status: Home / Self Care | Payer: No Typology Code available for payment source | Source: Ambulatory Visit | Attending: Nurse Practitioner | Admitting: Nurse Practitioner

## 2013-05-06 DIAGNOSIS — R609 Edema, unspecified: Secondary | ICD-10-CM

## 2013-05-06 DIAGNOSIS — R0602 Shortness of breath: Secondary | ICD-10-CM | POA: Insufficient documentation

## 2013-05-06 LAB — PULMONARY FUNCTION TEST
DL/VA % pred: 54 %
DL/VA: 2.53 ml/min/mmHg/L
DLCO unc % pred: 46 %
DLCO unc: 10.56 ml/min/mmHg
FEF 25-75 Post: 1.7 L/sec
FEF 25-75 Pre: 0.83 L/sec
FEF2575-%Change-Post: 104 %
FEF2575-%Pred-Post: 67 %
FEF2575-%Pred-Pre: 32 %
FEV1-%Change-Post: 25 %
FEV1-%Pred-Post: 90 %
FEV1-%Pred-Pre: 72 %
FEV1-Post: 2.1 L
FEV1-Pre: 1.68 L
FEV1FVC-%Change-Post: 11 %
FEV1FVC-%Pred-Pre: 74 %
FEV6-%Change-Post: 12 %
FEV6-%Pred-Post: 109 %
FEV6-%Pred-Pre: 97 %
FEV6-Post: 3.05 L
FEV6-Pre: 2.71 L
FEV6FVC-%Change-Post: 0 %
FEV6FVC-%Pred-Post: 101 %
FEV6FVC-%Pred-Pre: 100 %
FVC-%Change-Post: 11 %
FVC-%Pred-Post: 107 %
FVC-%Pred-Pre: 95 %
FVC-Post: 3.07 L
FVC-Pre: 2.75 L
Post FEV1/FVC ratio: 68 %
Post FEV6/FVC ratio: 99 %
Pre FEV1/FVC ratio: 61 %
Pre FEV6/FVC Ratio: 99 %
RV % pred: 80 %
RV: 1.33 L
TLC % pred: 88 %
TLC: 4.36 L

## 2013-05-06 MED ORDER — ALBUTEROL SULFATE (2.5 MG/3ML) 0.083% IN NEBU
2.5000 mg | INHALATION_SOLUTION | Freq: Once | RESPIRATORY_TRACT | Status: AC
  Administered 2013-05-06: 2.5 mg via RESPIRATORY_TRACT

## 2013-05-07 ENCOUNTER — Encounter (HOSPITAL_COMMUNITY)
Admission: RE | Admit: 2013-05-07 | Discharge: 2013-05-07 | Disposition: A | Discharge status: Home / Self Care | Payer: No Typology Code available for payment source | Source: Ambulatory Visit | Attending: Cardiology | Admitting: Cardiology

## 2013-05-07 ENCOUNTER — Other Ambulatory Visit: Payer: Self-pay | Admitting: *Deleted

## 2013-05-07 DIAGNOSIS — R942 Abnormal results of pulmonary function studies: Secondary | ICD-10-CM

## 2013-05-10 ENCOUNTER — Encounter (HOSPITAL_COMMUNITY)
Admission: RE | Admit: 2013-05-10 | Discharge: 2013-05-10 | Disposition: A | Discharge status: Home / Self Care | Payer: No Typology Code available for payment source | Source: Ambulatory Visit | Attending: Cardiology | Admitting: Cardiology

## 2013-05-12 ENCOUNTER — Encounter (HOSPITAL_COMMUNITY): Payer: Self-pay

## 2013-05-14 ENCOUNTER — Encounter (HOSPITAL_COMMUNITY)
Admission: RE | Admit: 2013-05-14 | Discharge: 2013-05-14 | Disposition: A | Discharge status: Home / Self Care | Payer: No Typology Code available for payment source | Source: Ambulatory Visit | Attending: Cardiology | Admitting: Cardiology

## 2013-05-17 ENCOUNTER — Encounter (HOSPITAL_COMMUNITY)
Admission: RE | Admit: 2013-05-17 | Discharge: 2013-05-17 | Disposition: A | Discharge status: Home / Self Care | Payer: No Typology Code available for payment source | Source: Ambulatory Visit | Attending: Cardiology | Admitting: Cardiology

## 2013-05-18 ENCOUNTER — Institutional Professional Consult (permissible substitution): Payer: No Typology Code available for payment source | Admitting: Internal Medicine

## 2013-05-19 ENCOUNTER — Encounter (HOSPITAL_COMMUNITY): Payer: Self-pay

## 2013-05-21 ENCOUNTER — Encounter (HOSPITAL_COMMUNITY): Payer: Self-pay

## 2013-05-24 ENCOUNTER — Encounter (HOSPITAL_COMMUNITY)
Admission: RE | Admit: 2013-05-24 | Discharge: 2013-05-24 | Disposition: A | Discharge status: Home / Self Care | Payer: No Typology Code available for payment source | Source: Ambulatory Visit | Attending: Cardiology | Admitting: Cardiology

## 2013-05-26 ENCOUNTER — Encounter (HOSPITAL_COMMUNITY): Payer: Self-pay

## 2013-05-26 ENCOUNTER — Telehealth (HOSPITAL_COMMUNITY): Payer: Self-pay | Admitting: Internal Medicine

## 2013-05-28 ENCOUNTER — Encounter (HOSPITAL_COMMUNITY): Payer: No Typology Code available for payment source

## 2013-05-31 ENCOUNTER — Encounter (HOSPITAL_COMMUNITY): Payer: No Typology Code available for payment source

## 2013-05-31 DIAGNOSIS — F1410 Cocaine abuse, uncomplicated: Secondary | ICD-10-CM | POA: Insufficient documentation

## 2013-05-31 DIAGNOSIS — Z5189 Encounter for other specified aftercare: Secondary | ICD-10-CM | POA: Insufficient documentation

## 2013-05-31 DIAGNOSIS — F1210 Cannabis abuse, uncomplicated: Secondary | ICD-10-CM | POA: Insufficient documentation

## 2013-05-31 DIAGNOSIS — E876 Hypokalemia: Secondary | ICD-10-CM | POA: Insufficient documentation

## 2013-05-31 DIAGNOSIS — E079 Disorder of thyroid, unspecified: Secondary | ICD-10-CM | POA: Insufficient documentation

## 2013-05-31 DIAGNOSIS — E785 Hyperlipidemia, unspecified: Secondary | ICD-10-CM | POA: Insufficient documentation

## 2013-05-31 DIAGNOSIS — I2510 Atherosclerotic heart disease of native coronary artery without angina pectoris: Secondary | ICD-10-CM | POA: Insufficient documentation

## 2013-05-31 DIAGNOSIS — F17200 Nicotine dependence, unspecified, uncomplicated: Secondary | ICD-10-CM | POA: Insufficient documentation

## 2013-05-31 DIAGNOSIS — I214 Non-ST elevation (NSTEMI) myocardial infarction: Secondary | ICD-10-CM | POA: Insufficient documentation

## 2013-06-02 ENCOUNTER — Encounter (HOSPITAL_COMMUNITY)
Admission: RE | Admit: 2013-06-02 | Discharge: 2013-06-02 | Disposition: A | Discharge status: Home / Self Care | Payer: No Typology Code available for payment source | Source: Ambulatory Visit | Attending: Cardiology | Admitting: Cardiology

## 2013-06-03 ENCOUNTER — Ambulatory Visit: Payer: No Typology Code available for payment source | Admitting: Emergency Medicine

## 2013-06-03 ENCOUNTER — Other Ambulatory Visit: Payer: No Typology Code available for payment source

## 2013-06-03 ENCOUNTER — Encounter: Payer: Self-pay | Admitting: Emergency Medicine

## 2013-06-03 VITALS — BP 130/82 | HR 71 | Ht 63.0 in | Wt 135.8 lb

## 2013-06-03 DIAGNOSIS — J449 Chronic obstructive pulmonary disease, unspecified: Secondary | ICD-10-CM

## 2013-06-03 DIAGNOSIS — J309 Allergic rhinitis, unspecified: Secondary | ICD-10-CM

## 2013-06-03 MED ORDER — ALBUTEROL SULFATE HFA 108 (90 BASE) MCG/ACT IN AERS: 2.0000 | INHALATION_SPRAY | RESPIRATORY_TRACT | Status: DC | PRN

## 2013-06-03 NOTE — Patient Instructions (Signed)
Start Symbicort 2 puffs twice a day.  Start ProAir (albuterol) 2 puffs if needed for shortness of breath Continue your loratadine every day Try using nasonex 2 sprays each nostril daily. If this medication helps you then call our office and we will call a generic version to your pharmacy.  Lab work today Follow with Dr Lamonte Sakai in 1 month

## 2013-06-03 NOTE — Progress Notes (Signed)
Subjective:    Patient ID: [name] [name], female    DOB: 07-Dec-1965, 48 y.o.   MRN: [id number]  HPI 48 yo woman, former smoker (20 pk-yrs), CAD, depression. She is referred by Dr Meda Coffee for dyspnea. She was admitted with a NSTEMI in 1/15, c/b VT. Cath showed a non-occlusive thrombus, no PCI done. He sx at the time were arm pain. Since then she has experienced exertional SOB.  No cough. Rare wheeze with exertion. She has allergies, loratadine not helping her.    Review of Systems  Constitutional: Negative for fever and unexpected weight change.  HENT: Positive for sneezing. Negative for congestion, dental problem, ear pain, nosebleeds, postnasal drip, rhinorrhea, sinus pressure, sore throat and trouble swallowing.   Eyes: Negative for redness and itching.  Respiratory: Positive for shortness of breath. Negative for cough, chest tightness and wheezing.   Cardiovascular: Positive for palpitations and leg swelling.  Gastrointestinal: Negative for nausea and vomiting.  Genitourinary: Negative for dysuria.  Musculoskeletal: Positive for joint swelling.       Pain and stiffness  Skin: Negative for rash.  Neurological: Negative for headaches.  Hematological: Bruises/bleeds easily.  Psychiatric/Behavioral: Positive for dysphoric mood. The patient is nervous/anxious.    Past Medical History  Diagnosis Date  . Abnormal Pap smear     Colpo (twice)  . Anemia   . Chlamydia     years ago  . Trichomonas     years ago  . Breast disorder     soreness in left breast   . Depression   . CAD (coronary artery disease)     a. 01/2013 NSTEMI/Cath: LM nl, LAD 77m, LCX nl, RI nl, RCA nl, PDA ostial nonocclusive intraluminal thrombus, EF 65%, Treated with heparin x 48 hrs->Med Rx (occurred in setting of + cocaine);  b. 01/2013 Echo: EF 60-65%, nl wall motion.  . Polysubstance abuse     tobacco/cocaine/marijuana (+ UDS 01/2013).     Family History  Problem Relation Age of Onset  . Breast cancer Mother    . Hypertension Brother   . Kidney failure Brother   . Hypertension Sister      History   Social History  . Marital Status: Single    Spouse Name: N/A    Number of Children: N/A  . Years of Education: N/A   Occupational History  . Not on file.   Social History Main Topics  . Smoking status: Former Smoker -- 0.50 packs/day for 31 years    Types: Cigarettes    Quit date: 02/05/2013  . Smokeless tobacco: Never Used     Comment: February 05, 2013  . Alcohol Use: Yes     Comment: rarely  . Drug Use: No  . Sexual Activity: Yes    Birth Control/ Protection: None   Other Topics Concern  . Not on file   Social History Narrative  . No narrative on file     Allergies  Allergen Reactions  . Demerol Itching  . Novocain [Procaine Hcl] Swelling     Outpatient Prescriptions Prior to Visit  Medication Sig Dispense Refill  . acetaminophen (TYLENOL) 500 MG tablet Take 1,000 mg by mouth every 6 (six) hours as needed for mild pain.      Marland Kitchen aspirin EC 81 MG EC tablet Take 1 tablet (81 mg total) by mouth daily.      . carvedilol (COREG) 3.125 MG tablet Take 1 tablet (3.125 mg total) by mouth 2 (two) times daily with a meal.  68 tablet  0  . clopidogrel (PLAVIX) 75 MG tablet Take 1 tablet (75 mg total) by mouth daily with breakfast.  34 tablet  0  . furosemide (LASIX) 20 MG tablet Take 1 tablet (20 mg total) by mouth daily as needed.  20 tablet  3  . loratadine (CLARITIN) 10 MG tablet Take 10 mg by mouth daily.      . nitroGLYCERIN (NITROSTAT) 0.4 MG SL tablet Place 1 tablet (0.4 mg total) under the tongue every 5 (five) minutes x 3 doses as needed for chest pain.  25 tablet  3  . potassium chloride SA (K-DUR,KLOR-CON) 20 MEQ tablet 1/2 Tablet as needed with lasix  30 tablet  6  . pravastatin (PRAVACHOL) 40 MG tablet Take 1 tablet (40 mg total) by mouth daily.  34 tablet  0  . Vitamin D, Ergocalciferol, (DRISDOL) 50000 UNITS CAPS capsule Take 1 capsule (50,000 Units total) by mouth every 7  (seven) days.  10 capsule  0   No facility-administered medications prior to visit.         Objective:   Physical Exam Filed Vitals:   06/03/13 1415  BP: 130/82  Pulse: 71  Height: 5\' 3"  (1.6 m)  Weight: 135 lb 12.8 oz (61.598 kg)  SpO2: 100%   Gen: Pleasant, well-nourished, in no distress,  normal affect  ENT: No lesions,  mouth clear,  oropharynx clear, no postnasal drip  Neck: No JVD, no TMG, no carotid bruits  Lungs: No use of accessory muscles, clear without rales or rhonchi  Cardiovascular: RRR, heart sounds normal, no murmur or gallops, no peripheral edema  Musculoskeletal: No deformities, no cyanosis or clubbing  Neuro: alert, non focal  Skin: Warm, no lesions or rashes     Assessment & Plan:  COPD (chronic obstructive pulmonary disease) With an asthma-type pattern on PFT. Will start her on BD's, follow in 1 month to assess her progress.  - start symbicort - albuterol prn - a1-AT testing  Allergic rhinitis - add nasal steroid sample to loratadine. If she continues to need it we can call in a script for fluticasone.

## 2013-06-03 NOTE — Assessment & Plan Note (Signed)
-   add nasal steroid sample to loratadine. If she continues to need it we can call in a script for fluticasone.

## 2013-06-03 NOTE — Assessment & Plan Note (Signed)
With an asthma-type pattern on PFT. Will start her on BD's, follow in 1 month to assess her progress.  - start symbicort - albuterol prn - a1-AT testing

## 2013-06-04 ENCOUNTER — Ambulatory Visit (HOSPITAL_BASED_OUTPATIENT_CLINIC_OR_DEPARTMENT_OTHER): Payer: No Typology Code available for payment source

## 2013-06-04 ENCOUNTER — Encounter (HOSPITAL_COMMUNITY): Admission: RE | Admit: 2013-06-04 | Payer: No Typology Code available for payment source | Source: Ambulatory Visit

## 2013-06-04 ENCOUNTER — Other Ambulatory Visit: Payer: No Typology Code available for payment source

## 2013-06-07 ENCOUNTER — Encounter (HOSPITAL_COMMUNITY)
Admission: RE | Admit: 2013-06-07 | Discharge: 2013-06-07 | Disposition: A | Discharge status: Home / Self Care | Payer: No Typology Code available for payment source | Source: Ambulatory Visit | Attending: Cardiology | Admitting: Cardiology

## 2013-06-09 ENCOUNTER — Encounter (HOSPITAL_COMMUNITY): Payer: No Typology Code available for payment source

## 2013-06-10 LAB — ALPHA-1 ANTITRYPSIN PHENOTYPE: A-1 Antitrypsin: 124 mg/dL (ref 83–199)

## 2013-06-11 ENCOUNTER — Other Ambulatory Visit: Payer: No Typology Code available for payment source

## 2013-06-11 ENCOUNTER — Ambulatory Visit: Payer: No Typology Code available for payment source

## 2013-06-11 ENCOUNTER — Encounter (HOSPITAL_COMMUNITY)
Admission: RE | Admit: 2013-06-11 | Discharge: 2013-06-11 | Disposition: A | Discharge status: Home / Self Care | Payer: No Typology Code available for payment source | Source: Ambulatory Visit | Attending: Cardiology | Admitting: Cardiology

## 2013-06-11 VITALS — BP 98/74 | HR 82 | Temp 98.6°F | Resp 14 | Ht 62.0 in | Wt 132.7 lb

## 2013-06-11 DIAGNOSIS — Z0000 Encounter for general adult medical examination without abnormal findings: Secondary | ICD-10-CM

## 2013-06-11 LAB — GLUCOSE (CC13): Glucose: 99 mg/dl (ref 70–140)

## 2013-06-11 LAB — LIPID PANEL
CHOLESTEROL: 150 mg/dL (ref 0–200)
HDL: 52 mg/dL (ref >39)
LDL Cholesterol: 84 mg/dL (ref 0–99)
TRIGLYCERIDES: 70 mg/dL (ref <150)
Total CHOL/HDL Ratio: 2.9 Ratio
VLDL: 14 mg/dL (ref 0–40)

## 2013-06-11 LAB — HEMOGLOBIN A1C
Hgb A1c MFr Bld: 6 % — ABNORMAL HIGH (ref <5.7)
Mean Plasma Glucose: 126 mg/dL — ABNORMAL HIGH (ref <117)

## 2013-06-11 NOTE — Progress Notes (Signed)
Patient is a new patient to the [facility] program and is currently a [facility] patient effective 10/13/2012.   Clinical Measurements: Patient is 5 ft. 2 inches, weight 132.7 lbs, waist circumference 28 inches, and hip circumference 39.5inches.   Medical History: Patient has a history of high cholesterol and takes Pravaststin 40 mg qd for it. Patient does not have a history of hypertension.  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 Per patient has been diagnosed with a Non-ST Elevated Myocardial Infarction in January of this year. Patient states that takes her medications daily as ordered.  Blood Pressure, Self-measurement: Patient states that she checks BP a couple times a week at Cardiac Rehab.Patient stated that after cardiac rehab will not have means to check BP.  Nutrition Assessment: Patient stated that eats one fruit daily and two fruits a day three times a week. Patient states she eats at least 2 cups of vegetables daily.  She does not eat 3 or more ounces of whole grains daily. Patient does eat two or more servings of fish weekly. Patient stated that could eat fish everyday.Patient states she does not drink more than 36 ounces or 450 calories of beverages with added sugars weekly Patient stated she does watch her salt intake,   Physical Activity Assessment: Patient states that has been in [place] since 03/13/13. She is there for an hour and half two days a week. Per patient gets around 945 minutes exercise a week and does no vigorous exercise.  Smoking Status: Patient stated is a previous smoker and quit in January when had heart attack. Patient stated that is not around smoke.  Quality of Life Assessment: In assessing patient's quality of life she stated that out of the past 30 days that she has felt her health is not good three of them. Patient also stated that for the past 30 days that her mental health has not good including stress,  depression and problems with emotions. Patient stated that she has been experiencing depression and would like some assistance.Patient did state that out of the past 30 days that around 5 of them she felt her physical or mental health kept her from doing her usual activities including self-care, work or recreation.   Plan: Lab work will be done today including a lipid panel, blood glucose, and Hgb A1C. Will call lab results when they are finished. Patient will return for Health Coaching. Will seek out mental health assistance from [place].

## 2013-06-11 NOTE — Patient Instructions (Signed)
Discussed health assessment with patient. Explained and provided written information where she could be followed up for depression. Patient will be called with results of lab work and we will then will schedule Health Coaching.Patient will call physician about blood pressure and medication. Patient verbalized understanding.

## 2013-06-14 ENCOUNTER — Encounter (HOSPITAL_COMMUNITY): Payer: No Typology Code available for payment source

## 2013-06-15 ENCOUNTER — Telehealth: Payer: Self-pay

## 2013-06-15 NOTE — Telephone Encounter (Signed)
Called to discuss lab results and possible health coaching. No answer and did not have voicemail set up.

## 2013-06-16 ENCOUNTER — Encounter (HOSPITAL_COMMUNITY)
Admission: RE | Admit: 2013-06-16 | Discharge: 2013-06-16 | Disposition: A | Discharge status: Home / Self Care | Payer: No Typology Code available for payment source | Source: Ambulatory Visit | Attending: Cardiology | Admitting: Cardiology

## 2013-06-18 ENCOUNTER — Encounter (HOSPITAL_COMMUNITY): Payer: No Typology Code available for payment source

## 2013-06-23 ENCOUNTER — Encounter (HOSPITAL_COMMUNITY)
Admission: RE | Admit: 2013-06-23 | Discharge: 2013-06-23 | Disposition: A | Discharge status: Home / Self Care | Payer: No Typology Code available for payment source | Source: Ambulatory Visit | Attending: Cardiology | Admitting: Cardiology

## 2013-06-25 ENCOUNTER — Encounter (HOSPITAL_COMMUNITY): Payer: No Typology Code available for payment source

## 2013-06-28 ENCOUNTER — Encounter (HOSPITAL_COMMUNITY): Payer: No Typology Code available for payment source

## 2013-06-28 ENCOUNTER — Ambulatory Visit: Payer: Self-pay | Admitting: Internal Medicine

## 2013-06-28 ENCOUNTER — Telehealth (HOSPITAL_COMMUNITY): Payer: Self-pay | Admitting: Internal Medicine

## 2013-06-30 ENCOUNTER — Encounter (HOSPITAL_COMMUNITY): Admission: RE | Admit: 2013-06-30 | Payer: No Typology Code available for payment source | Source: Ambulatory Visit

## 2013-06-30 ENCOUNTER — Telehealth (HOSPITAL_COMMUNITY): Payer: Self-pay | Admitting: *Deleted

## 2013-06-30 NOTE — Telephone Encounter (Signed)
Pt called out today.  Unable to leave work to come to rehab.  Pt given earlier class time options for Friday. Pt plans to come either to the 6:45 or 8:15 before she has to go to work.  Pt plans to graduate on 6/19.

## 2013-07-02 ENCOUNTER — Encounter (HOSPITAL_COMMUNITY)
Admission: RE | Admit: 2013-07-02 | Discharge: 2013-07-02 | Disposition: A | Discharge status: Home / Self Care | Payer: No Typology Code available for payment source | Source: Ambulatory Visit | Attending: Cardiology | Admitting: Cardiology

## 2013-07-02 DIAGNOSIS — F17200 Nicotine dependence, unspecified, uncomplicated: Secondary | ICD-10-CM | POA: Insufficient documentation

## 2013-07-02 DIAGNOSIS — Z5189 Encounter for other specified aftercare: Secondary | ICD-10-CM | POA: Insufficient documentation

## 2013-07-02 DIAGNOSIS — E079 Disorder of thyroid, unspecified: Secondary | ICD-10-CM | POA: Insufficient documentation

## 2013-07-02 DIAGNOSIS — I214 Non-ST elevation (NSTEMI) myocardial infarction: Secondary | ICD-10-CM | POA: Insufficient documentation

## 2013-07-02 DIAGNOSIS — F1410 Cocaine abuse, uncomplicated: Secondary | ICD-10-CM | POA: Insufficient documentation

## 2013-07-02 DIAGNOSIS — E876 Hypokalemia: Secondary | ICD-10-CM | POA: Insufficient documentation

## 2013-07-02 DIAGNOSIS — F1210 Cannabis abuse, uncomplicated: Secondary | ICD-10-CM | POA: Insufficient documentation

## 2013-07-02 DIAGNOSIS — I2510 Atherosclerotic heart disease of native coronary artery without angina pectoris: Secondary | ICD-10-CM | POA: Insufficient documentation

## 2013-07-02 DIAGNOSIS — E785 Hyperlipidemia, unspecified: Secondary | ICD-10-CM | POA: Insufficient documentation

## 2013-07-05 ENCOUNTER — Encounter: Payer: Self-pay | Admitting: Emergency Medicine

## 2013-07-05 ENCOUNTER — Encounter (HOSPITAL_COMMUNITY): Payer: No Typology Code available for payment source

## 2013-07-05 ENCOUNTER — Ambulatory Visit: Payer: No Typology Code available for payment source | Admitting: Emergency Medicine

## 2013-07-05 VITALS — BP 114/60 | HR 73 | Ht 62.0 in | Wt 133.0 lb

## 2013-07-05 DIAGNOSIS — J449 Chronic obstructive pulmonary disease, unspecified: Secondary | ICD-10-CM

## 2013-07-05 MED ORDER — BUDESONIDE-FORMOTEROL FUMARATE 160-4.5 MCG/ACT IN AERO: 2.0000 | INHALATION_SPRAY | Freq: Two times a day (BID) | RESPIRATORY_TRACT | Status: DC

## 2013-07-05 NOTE — Assessment & Plan Note (Signed)
Has benfited from the symbicort. SABA use has decreased.  - continue symbicort bid, SABA prn - control allergies  - rov 6

## 2013-07-05 NOTE — Patient Instructions (Signed)
Please continue your Symbicort twice a day Use your albuterol 2 puffs as needed Continue loratadine daily Use your nasonex as needed. If you would like to try changing to the generic fluticasone [facility] then please call our office. You might be able to take this on every day Follow with Dr Lamonte Sakai in 6 months or sooner if you have any problems

## 2013-07-05 NOTE — Progress Notes (Signed)
   Subjective:    Patient ID: [name] [name], female    DOB: 02/21/1965, 48 y.o.   MRN: [id number]  HPI 48 yo woman, former smoker (20 pk-yrs), CAD, depression. She is referred by Dr Meda Coffee for dyspnea. She was admitted with a NSTEMI in 1/15, c/b VT. Cath showed a non-occlusive thrombus, no PCI done. He sx at the time were arm pain. Since then she has experienced exertional SOB.  No cough. Rare wheeze with exertion. She has allergies, loratadine not helping her.   ROV 07/05/13 -- returns for f/u dyspnea. Last time we started symbicort based on AFL + BD response. Added nasal steroid to loratadine. a1-AT was MM. She feels that the symbicort has helped her. Able to do more exertion, still has some DOE with certain activities. She does respond to albuterol - uses less than once a day. She tried the nasonex, has used prn.    Review of Systems  Constitutional: Negative for fever and unexpected weight change.  HENT: Positive for sneezing. Negative for congestion, dental problem, ear pain, nosebleeds, postnasal drip, rhinorrhea, sinus pressure, sore throat and trouble swallowing.   Eyes: Negative for redness and itching.  Respiratory: Positive for shortness of breath. Negative for cough, chest tightness and wheezing.   Cardiovascular: Positive for palpitations and leg swelling.  Gastrointestinal: Negative for nausea and vomiting.  Genitourinary: Negative for dysuria.  Musculoskeletal: Positive for joint swelling.       Pain and stiffness  Skin: Negative for rash.  Neurological: Negative for headaches.  Hematological: Bruises/bleeds easily.  Psychiatric/Behavioral: Positive for dysphoric mood. The patient is nervous/anxious.       Objective:   Physical Exam Filed Vitals:   07/05/13 1340  BP: 114/60  Pulse: 73  Height: 5\' 2"  (1.575 m)  Weight: 133 lb (60.328 kg)  SpO2: 98%   Gen: Pleasant, well-nourished, in no distress,  normal affect  ENT: No lesions,  mouth clear,  oropharynx clear, no  postnasal drip  Neck: No JVD, no TMG, no carotid bruits  Lungs: No use of accessory muscles, clear without rales or rhonchi  Cardiovascular: RRR, heart sounds normal, no murmur or gallops, no peripheral edema  Musculoskeletal: No deformities, no cyanosis or clubbing  Neuro: alert, non focal  Skin: Warm, no lesions or rashes     Assessment & Plan:  COPD (chronic obstructive pulmonary disease) Has benfited from the symbicort. SABA use has decreased.  - continue symbicort bid, SABA prn - control allergies  - rov 6

## 2013-07-07 ENCOUNTER — Telehealth (HOSPITAL_COMMUNITY): Payer: Self-pay | Admitting: *Deleted

## 2013-07-07 ENCOUNTER — Encounter (HOSPITAL_COMMUNITY): Payer: No Typology Code available for payment source

## 2013-07-07 NOTE — Telephone Encounter (Signed)
Pt called out today from exercise due to daughters MRI today at noon.  Pt hopes to return to exercise on Friday.

## 2013-07-09 ENCOUNTER — Encounter (HOSPITAL_COMMUNITY): Payer: No Typology Code available for payment source

## 2013-07-12 ENCOUNTER — Encounter (HOSPITAL_COMMUNITY)
Admission: RE | Admit: 2013-07-12 | Discharge: 2013-07-12 | Disposition: A | Discharge status: Home / Self Care | Payer: No Typology Code available for payment source | Source: Ambulatory Visit | Attending: Cardiology | Admitting: Cardiology

## 2013-07-12 LAB — GLUCOSE, CAPILLARY: Glucose-Capillary: 119 mg/dL — ABNORMAL HIGH (ref 70–99)

## 2013-07-12 NOTE — Progress Notes (Signed)
Pt in after absence last week due to work demands.  Pt had an episode while she attended [employer] graduation on Saturday.  Pt treated for heat exhaustion on site.  Noted temperatures in the 90's and pt had a great distance to walk from her car to the stadium.  EMS checked her blood glucose level and reported "250".  This surprised pt because she had a recent HGA1C 5.9 and was told she was pre-diabetic.  Pt instructed to watch her diet and exercise.  Pt asked rehab staff to check her blood glucose.  Pre exercise 119 and pt ate breakfast 3 hours ago.  Pt reassured that the elevation in blood glucose could have been due to being dehydrated.  Pt has follow up in the community health and wellness clinic in 6 months.  Will check pt blood glucose on Wednesday and assist in scheduling early appt if warranted. Cherre Huger, BSN

## 2013-07-14 ENCOUNTER — Encounter (HOSPITAL_COMMUNITY): Payer: No Typology Code available for payment source

## 2013-07-16 ENCOUNTER — Encounter (HOSPITAL_COMMUNITY)
Admission: RE | Admit: 2013-07-16 | Discharge: 2013-07-16 | Disposition: A | Discharge status: Home / Self Care | Payer: No Typology Code available for payment source | Source: Ambulatory Visit | Attending: Cardiology | Admitting: Cardiology

## 2013-07-16 LAB — GLUCOSE, CAPILLARY: Glucose-Capillary: 99 mg/dL (ref 70–99)

## 2013-07-16 NOTE — Progress Notes (Signed)
[name] graduates today and plans to continue exercise via walking. [name] is considering participating in the cardiac maintenance program in the future.

## 2013-08-16 ENCOUNTER — Other Ambulatory Visit: Payer: Self-pay

## 2013-08-16 MED ORDER — CARVEDILOL 3.125 MG PO TABS: 3.1250 mg | ORAL_TABLET | Freq: Two times a day (BID) | ORAL | Status: DC

## 2013-08-16 MED ORDER — CLOPIDOGREL BISULFATE 75 MG PO TABS: 75.0000 mg | ORAL_TABLET | Freq: Every day | ORAL | Status: DC

## 2013-08-16 MED ORDER — PRAVASTATIN SODIUM 40 MG PO TABS: 40.0000 mg | ORAL_TABLET | Freq: Every day | ORAL | Status: DC

## 2013-09-17 ENCOUNTER — Other Ambulatory Visit: Payer: Self-pay

## 2013-09-17 DIAGNOSIS — Z1231 Encounter for screening mammogram for malignant neoplasm of breast: Secondary | ICD-10-CM

## 2013-09-23 ENCOUNTER — Telehealth (HOSPITAL_COMMUNITY): Payer: Self-pay | Admitting: *Deleted

## 2013-09-23 NOTE — Telephone Encounter (Signed)
Telephoned patient at home # and no voicemail box set up.

## 2013-09-30 ENCOUNTER — Other Ambulatory Visit (HOSPITAL_COMMUNITY): Payer: Self-pay | Admitting: *Deleted

## 2013-09-30 DIAGNOSIS — N6320 Unspecified lump in the left breast, unspecified quadrant: Secondary | ICD-10-CM

## 2013-10-18 ENCOUNTER — Other Ambulatory Visit: Payer: Self-pay

## 2013-10-18 MED ORDER — CLOPIDOGREL BISULFATE 75 MG PO TABS
75.0000 mg | ORAL_TABLET | Freq: Every day | ORAL | Status: DC
Start: 2013-10-18 — End: 2015-11-03

## 2013-10-18 MED ORDER — CARVEDILOL 3.125 MG PO TABS: 3.1250 mg | ORAL_TABLET | Freq: Two times a day (BID) | ORAL | Status: DC

## 2013-10-18 MED ORDER — PRAVASTATIN SODIUM 40 MG PO TABS
40.0000 mg | ORAL_TABLET | Freq: Every day | ORAL | Status: DC
Start: 2013-10-18 — End: 2014-10-12

## 2013-10-19 ENCOUNTER — Ambulatory Visit: Payer: Self-pay

## 2013-10-19 ENCOUNTER — Encounter (HOSPITAL_COMMUNITY): Payer: Self-pay

## 2013-10-19 ENCOUNTER — Ambulatory Visit: Payer: No Typology Code available for payment source

## 2013-10-19 ENCOUNTER — Ambulatory Visit (HOSPITAL_COMMUNITY)
Admission: RE | Admit: 2013-10-19 | Discharge: 2013-10-19 | Disposition: A | Discharge status: Home / Self Care | Payer: No Typology Code available for payment source | Source: Ambulatory Visit | Attending: Obstetrics and Gynecology | Admitting: Obstetrics and Gynecology

## 2013-10-19 VITALS — BP 102/76 | Temp 98.4°F | Ht 63.0 in | Wt 133.2 lb

## 2013-10-19 DIAGNOSIS — Z01419 Encounter for gynecological examination (general) (routine) without abnormal findings: Secondary | ICD-10-CM

## 2013-10-19 DIAGNOSIS — N6452 Nipple discharge: Secondary | ICD-10-CM

## 2013-10-19 DIAGNOSIS — N898 Other specified noninflammatory disorders of vagina: Secondary | ICD-10-CM

## 2013-10-19 HISTORY — DX: Heart disease, unspecified: I51.9

## 2013-10-19 NOTE — Progress Notes (Signed)
Complaints of bilateral clear spontaneous nipple discharge and complaints of bilateral breast pain around the nipple. Patient rates pain at a 2 out of 10.  Pap Smear:  Pap smear completed today. Patients last Pap smear was 11/06/2010 at [facility] clinic and normal. Per patient has a history of an abnormal Pap smear in 2001 that required a colposcopy for follow-up and in 1998 that a LEEP was completed for follow-up. Last Pap smear result is in EPIC.  Physical exam: Breasts Breasts symmetrical. No skin abnormalities bilateral breasts. No nipple retraction bilateral breasts. Bilateral clear nipple discharge. Sample of discharge bilateral nipples sent to cytology. No lymphadenopathy. No lumps palpated bilateral breasts. Complaints of left breast tenderness around nipple on exam. Referred patient to the [facility] for diagnostic mammogram and possible bilateral breast ultrasound. Appointment scheduled for Tuesday, October 26, 2013 at 1115.         Pelvic/Bimanual   Ext Genitalia No lesions, no swelling and no discharge observed on external genitalia.         Vagina Vagina pink and normal texture. No lesions and thick white cottage cheese appearing discharge observed in vagina. Wet prep completed.          Cervix Cervix is present. Cervix pink and of normal texture. Thick cottage cheese appearing discharge observed on cervix.    Uterus Uterus is present and palpable. Uterus in normal position and normal size.        Adnexae Bilateral ovaries present and palpable. No tenderness on palpation.          Rectovaginal No rectal exam completed today since patient had no rectal complaints. No skin abnormalities observed on exam.

## 2013-10-19 NOTE — Patient Instructions (Signed)
Explained to [employer] that [facility] will cover Pap smears and co-testing every 5 years unless has a history of abnormal Pap smears. Referred patient to the [facility] for diagnostic mammogram and possible bilateral breast ultrasound. Appointment scheduled for Tuesday, October 26, 2013 at 1115. Patient aware of appointment and will be there. Let patient know will follow up with her within the next couple weeks with results of Pap smear and wet prep by phone. [name] [name] Verbalized understanding.

## 2013-10-20 ENCOUNTER — Telehealth (HOSPITAL_COMMUNITY): Payer: Self-pay

## 2013-10-20 NOTE — Telephone Encounter (Signed)
Called to inform about lab work from 06/11/13. informed patient: cholesterol- 150, HDL- 52, LDL- 84, triglycerides - 70, Bld Glucose -99 and HBG-A1C - 6.o.  Set up health Coaching for September 24 at 8 AM at cancer center for nutrition and activity. Patient voiced being unhappy with PCP. Plan: [facility] and alternative care options.

## 2013-10-21 ENCOUNTER — Ambulatory Visit: Payer: No Typology Code available for payment source

## 2013-10-21 DIAGNOSIS — Z789 Other specified health status: Secondary | ICD-10-CM

## 2013-10-21 LAB — CYTOLOGY - PAP

## 2013-10-21 NOTE — Patient Instructions (Signed)
Patient will follow 1600 calorie diet plan. Will review all handouts and exercise/activity book. Will increase fiber in diet. Will decrease Carbohydrates to less than 200 per day. Will increase exercise. Will measure portion sizes. Will call if has any questions. Will call patient in three weeks. Patient verbalized understanding.

## 2013-10-21 NOTE — Progress Notes (Signed)
Patient returns today for Health Coaching regarding Nutrition for her borderline AIC and activity.   NUTRITION: Patient reviewed A1C handout to see about prediabetes, normal and  diabetes range. Patient went over what prediabetes is, complications that can result if do not get levels to normal, and diabetes level. .Discussed increasing fiber in diet, reading labels and serving sizes.  Discussed watching carbohydrates, how to count them, serving sizes and number of carbs per day allowance. Patient reviewed weight /activity chart. Per patient she does moderate activity and does not eat much. She states that does drink beverages with added sugars. Patient stated that no one had told her anything about what to do with elevated A1C. Patient stated that in August she passed out and EMS asked her had she taken her insulin because capillary blood sugar was 250. Discussed changing over to artificial sweetened drinks. Discussed the importance of serving sizes, counting carbohydrates and increasing fiber in diet. Patient went over 1,600 cal diet and we broke it down to the number of servings she could have. Patient received and reviewed the following handouts: My Plate, Carb counting Menu, prediabetes, A1C Exam, Diabetes Meal Make half your grains whole, [facility] Book (cook book &. [facility] Activity). Gave patient measuring cup to measure serving sizes and demonstrated the serving sizes.  ACTIVITY: Discussed activity and walking Patient discussed other activity. Reviewed [facility] Activity booklet.  Miscellaneous: Discussed not being able to get an appointment at [place] office for two months and is not happy. Health Department would not fill medications because is patient at [facility]. Patient stated had to call [facility] doctor for medication refill due to this. Will contact Family medicine and [facility] to see if they have openings for appointments. I would be following up with her 3 more times because of Health  Coaching ([facility]) in about 25 days, and would rescreen next year.  PLAN: Patient will increase amount of walking per day and add other exercises to plan, Decrease carbohydrates in diet. Lose weight. Follow up times three per phone unless needs and can call.

## 2013-10-25 ENCOUNTER — Ambulatory Visit: Payer: Self-pay

## 2013-10-26 ENCOUNTER — Ambulatory Visit
Admission: RE | Admit: 2013-10-26 | Discharge: 2013-10-26 | Disposition: A | Discharge status: Home / Self Care | Payer: No Typology Code available for payment source | Source: Ambulatory Visit | Attending: Obstetrics and Gynecology | Admitting: Obstetrics and Gynecology

## 2013-10-26 DIAGNOSIS — N6320 Unspecified lump in the left breast, unspecified quadrant: Secondary | ICD-10-CM

## 2013-10-27 ENCOUNTER — Other Ambulatory Visit: Payer: Self-pay | Admitting: Obstetrics and Gynecology

## 2013-10-27 MED ORDER — METRONIDAZOLE 500 MG PO TABS: 500.0000 mg | ORAL_TABLET | Freq: Two times a day (BID) | ORAL | Status: DC

## 2013-11-02 ENCOUNTER — Encounter: Payer: Self-pay | Admitting: *Deleted

## 2013-11-03 ENCOUNTER — Other Ambulatory Visit (HOSPITAL_COMMUNITY): Payer: Self-pay | Admitting: *Deleted

## 2013-11-03 ENCOUNTER — Ambulatory Visit (INDEPENDENT_AMBULATORY_CARE_PROVIDER_SITE_OTHER): Payer: No Typology Code available for payment source | Admitting: Cardiology

## 2013-11-03 ENCOUNTER — Telehealth (HOSPITAL_COMMUNITY): Payer: Self-pay | Admitting: *Deleted

## 2013-11-03 ENCOUNTER — Encounter: Payer: Self-pay | Admitting: Cardiology

## 2013-11-03 VITALS — BP 98/62 | HR 73 | Ht 63.0 in | Wt 133.0 lb

## 2013-11-03 DIAGNOSIS — J449 Chronic obstructive pulmonary disease, unspecified: Secondary | ICD-10-CM

## 2013-11-03 DIAGNOSIS — N760 Acute vaginitis: Secondary | ICD-10-CM

## 2013-11-03 DIAGNOSIS — I25119 Atherosclerotic heart disease of native coronary artery with unspecified angina pectoris: Secondary | ICD-10-CM

## 2013-11-03 DIAGNOSIS — E079 Disorder of thyroid, unspecified: Secondary | ICD-10-CM

## 2013-11-03 DIAGNOSIS — F1910 Other psychoactive substance abuse, uncomplicated: Secondary | ICD-10-CM

## 2013-11-03 DIAGNOSIS — E785 Hyperlipidemia, unspecified: Secondary | ICD-10-CM

## 2013-11-03 DIAGNOSIS — Z720 Tobacco use: Secondary | ICD-10-CM

## 2013-11-03 DIAGNOSIS — B9689 Other specified bacterial agents as the cause of diseases classified elsewhere: Secondary | ICD-10-CM

## 2013-11-03 DIAGNOSIS — I214 Non-ST elevation (NSTEMI) myocardial infarction: Secondary | ICD-10-CM

## 2013-11-03 DIAGNOSIS — R079 Chest pain, unspecified: Secondary | ICD-10-CM

## 2013-11-03 MED ORDER — RANOLAZINE ER 500 MG PO TB12: 500.0000 mg | ORAL_TABLET | Freq: Two times a day (BID) | ORAL | Status: DC

## 2013-11-03 MED ORDER — METRONIDAZOLE 500 MG PO TABS: 500.0000 mg | ORAL_TABLET | Freq: Two times a day (BID) | ORAL | Status: DC

## 2013-11-03 NOTE — Patient Instructions (Addendum)
Your physician has recommended you make the following change in your medication:   START TAKING RANEXA [place]    Your physician recommends that you return for lab work in: ON Friday 11/05/13- PLEASE BE FASTING (CBC W DIFF, CMP, LIPIDS, HGBA1C)   Your physician has requested that you have en exercise stress myoview. For further information please visit [email or web address]. Please follow instruction sheet, as given.    Your physician recommends that you schedule a follow-up appointment in: [place]

## 2013-11-03 NOTE — Telephone Encounter (Signed)
Patient called me with questions about her Pap smear and wet prep. Let patient know that her Pap smear was normal and it showed Trichomonas. Let patient know that her wet prep showed BV and both of her breast discharge samples were benign. Told patient that a prescription for Flagyl has been sent to the [facility] as was in the system. Patient stated she is uses Paediatric nurse at [facility]. Sent the prescription to [facility]. Told patient to avoid alcohol while taking Flagyl and that her partner needs to get treated also. Explained to patient that he can go to the [facility] and be treated free of charge. Explained the importance of him being treated also. Patient asked if she can be retested after completes medication since this is not her first time having Trichomonas. Told patient she can go to the [facility]. Patient verbalized understanding.

## 2013-11-03 NOTE — Progress Notes (Signed)
Patient ID: Kathie Posa, female   DOB: 1965-10-29, 48 y.o.   MRN: [id number]    Tametria Aho Date of Birth: 1965/03/05 Medical Record #[id number]  History of Present Illness: Ms. Konkle is seen back today for a work in visit. Seen for Dr. Meda Coffee. 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 She did not require PCI. EF was normal. Other issues as noted. Has been in cardiac rehab and did well.  Comes back today. Here alone. She states that her shortness of breath and lower extremity edema has resolved. She has been taking Lasix 20 mg daily as needed. She denies any arm chest pain, orthopnea, PND. No syncope. She states that occasionally she will have a few second lasting palpitations they're not associated with any other symptoms such as dizziness or shortness of breath.  Sister did accompany her to the office but not to the room due to last OV note mentioning that she did not wish to have substance abuse issues discussed with anyone else present.   11/03/2013 - the patient reports recurrence of chest pain, especially on exertion, 3-4 times per week, much milder than when she had NSTEMI. No SOB, plapitations, syncope. The pain resolves within minutes with NTG.    Current Outpatient Prescriptions  Medication Sig Dispense Refill  . acetaminophen (TYLENOL) 500 MG tablet Take 1,000 mg by mouth every 6 (six) hours as needed for mild pain.      Marland Kitchen albuterol (PROAIR HFA) 108 (90 BASE) MCG/ACT inhaler Inhale 2 puffs into the lungs every 4 (four) hours as needed for wheezing or shortness of breath.  1 Inhaler  11  . aspirin EC 81 MG EC tablet Take 1 tablet (81 mg total) by mouth daily.      . budesonide-formoterol (SYMBICORT) 160-4.5 MCG/ACT inhaler Inhale 2 puffs into the lungs 2 (two) times daily.  1 Inhaler  11  . carvedilol (COREG) 3.125 MG tablet Take 1 tablet (3.125 mg total) by mouth 2 (two) times daily with a meal.  60 tablet  0  . cetirizine (ZYRTEC) 10 MG tablet Take 10 mg by mouth  daily.      . clopidogrel (PLAVIX) 75 MG tablet Take 1 tablet (75 mg total) by mouth daily with breakfast.  30 tablet  1  . furosemide (LASIX) 20 MG tablet Take 1 tablet (20 mg total) by mouth daily as needed.  20 tablet  3  . metroNIDAZOLE (FLAGYL) 500 MG tablet Take 1 tablet (500 mg total) by mouth 2 (two) times daily.  14 tablet  0  . potassium chloride SA (K-DUR,KLOR-CON) 20 MEQ tablet 1/2 Tablet as needed with lasix  30 tablet  6  . pravastatin (PRAVACHOL) 40 MG tablet Take 1 tablet (40 mg total) by mouth daily.  30 tablet  1  . nitroGLYCERIN (NITROSTAT) 0.4 MG SL tablet Place 1 tablet (0.4 mg total) under the tongue every 5 (five) minutes x 3 doses as needed for chest pain.  25 tablet  3   No current facility-administered medications for this visit.    Allergies  Allergen Reactions  . Demerol Itching  . Novocain [Procaine Hcl] Swelling    Past Medical History  Diagnosis Date  . Abnormal Pap smear     Colpo (twice)  . Anemia   . Chlamydia     years ago  . Trichomonas     years ago  . Breast disorder     soreness in left breast   . Depression   .  CAD (coronary artery disease)     a. 01/2013 NSTEMI/Cath: LM nl, LAD 5m, LCX nl, RI nl, RCA nl, PDA ostial nonocclusive intraluminal thrombus, EF 65%, Treated with heparin x 48 hrs->Med Rx (occurred in setting of + cocaine);  b. 01/2013 Echo: EF 60-65%, nl wall motion.  . Polysubstance abuse     tobacco/cocaine/marijuana (+ UDS 01/2013).  Marland Kitchen Heart disease     Past Surgical History  Procedure Laterality Date  . Breast surgery      lump removed from right breast  . Endometrial ablation w/ novasure    . Groin mass open biopsy    . Cervical cone biopsy    . Cesarean section      2 c-sections  . Ectopic pregnancy surgery      History  Smoking status  . Former Smoker -- 0.50 packs/day for 31 years  . Types: Cigarettes  . Quit date: 02/05/2013  Smokeless tobacco  . Never Used    Comment: February 05, 2013    History    Alcohol Use  . Yes    Comment: rarely    Family History  Problem Relation Age of Onset  . Breast cancer Mother   . Hypertension Brother   . Kidney failure Brother   . Hypertension Sister     Review of Systems: The review of systems is per the HPI.  All other systems were reviewed and are negative.  Physical Exam: BP 98/62  Pulse 73  Ht 5\' 3"  (1.6 m)  Wt 133 lb (60.328 kg)  BMI 23.57 kg/m2  LMP 07/19/2013 Patient is pleasant and in no acute distress. Skin is warm and dry. Color is normal.  HEENT is unremarkable. Normocephalic/atraumatic. PERRL. Sclera are nonicteric. Neck is supple. No masses. No JVD. Lungs are clear. Cardiac exam shows a regular rate and rhythm. Abdomen is soft. Extremities are with trace lower extremity edema. Gait and ROM are intact. No gross neurologic deficits noted.  Wt Readings from Last 3 Encounters:  11/03/13 133 lb (60.328 kg)  10/19/13 133 lb 3.2 oz (60.419 kg)  07/05/13 133 lb (60.328 kg)     LABORATORY DATA: PENDING  Lab Results  Component Value Date   WBC 6.4 03/02/2013   HGB 11.9* 03/02/2013   HCT 37.4 03/02/2013   PLT 286 03/02/2013   GLUCOSE 99 06/11/2013   CHOL 150 06/11/2013   TRIG 70 06/11/2013   HDL 52 06/11/2013   LDLCALC 84 06/11/2013   ALT 18 05/03/2013   AST 17 05/03/2013   NA 138 05/03/2013   K 3.3* 05/03/2013   CL 102 05/03/2013   CREATININE 0.9 05/03/2013   BUN 10 05/03/2013   CO2 29 05/03/2013   TSH 0.32* 03/01/2013   INR 1.13 02/06/2013   HGBA1C 6.0* 06/11/2013     Assessment / Plan:  1. NSTEMI - with nonocclusive intraluminal thrombus - managed with heparin and remains on chronic Plavix - with recurrent chest pain 3-4/week, responding to NTG. Since she haven't got a stent I will schedule an exercise nuclear stress test. We will start ranolazine 500 mg po BID (free samples given for a trial for 6 weeks).    2. LE Swelling - resolved , we'll check CMP today, lasix PRN. BNP was normal.  3. HLD - labs from last month reviewed with her. She  has had a nice response to her statin. Check liver enzymes today  4. Elevated HbA1c - 3 months ago, we will repeat today  Follow up in 6  weeks    Dorothy Spark 11/03/2013

## 2013-11-05 ENCOUNTER — Other Ambulatory Visit (INDEPENDENT_AMBULATORY_CARE_PROVIDER_SITE_OTHER): Payer: No Typology Code available for payment source | Admitting: *Deleted

## 2013-11-05 DIAGNOSIS — E785 Hyperlipidemia, unspecified: Secondary | ICD-10-CM

## 2013-11-05 DIAGNOSIS — E079 Disorder of thyroid, unspecified: Secondary | ICD-10-CM

## 2013-11-05 DIAGNOSIS — R079 Chest pain, unspecified: Secondary | ICD-10-CM

## 2013-11-05 DIAGNOSIS — J449 Chronic obstructive pulmonary disease, unspecified: Secondary | ICD-10-CM

## 2013-11-05 DIAGNOSIS — Z720 Tobacco use: Secondary | ICD-10-CM

## 2013-11-05 DIAGNOSIS — F1910 Other psychoactive substance abuse, uncomplicated: Secondary | ICD-10-CM

## 2013-11-05 DIAGNOSIS — I214 Non-ST elevation (NSTEMI) myocardial infarction: Secondary | ICD-10-CM

## 2013-11-05 DIAGNOSIS — I25119 Atherosclerotic heart disease of native coronary artery with unspecified angina pectoris: Secondary | ICD-10-CM

## 2013-11-05 LAB — COMPREHENSIVE METABOLIC PANEL
ALT: 13 U/L (ref 0–35)
AST: 16 U/L (ref 0–37)
Albumin: 3.5 g/dL (ref 3.5–5.2)
Alkaline Phosphatase: 64 U/L (ref 39–117)
BUN: 9 mg/dL (ref 6–23)
CO2: 24 mEq/L (ref 19–32)
Calcium: 8.8 mg/dL (ref 8.4–10.5)
Chloride: 106 mEq/L (ref 96–112)
Creatinine, Ser: 0.9 mg/dL (ref 0.4–1.2)
GFR: 92.3 mL/min (ref >60.00)
Glucose, Bld: 100 mg/dL — ABNORMAL HIGH (ref 70–99)
Potassium: 3.7 mEq/L (ref 3.5–5.1)
Sodium: 136 mEq/L (ref 135–145)
Total Bilirubin: 0.6 mg/dL (ref 0.2–1.2)
Total Protein: 8 g/dL (ref 6.0–8.3)

## 2013-11-05 LAB — LIPID PANEL
Cholesterol: 149 mg/dL (ref 0–200)
HDL: 41.8 mg/dL (ref >39.00)
LDL Cholesterol: 94 mg/dL (ref 0–99)
NonHDL: 107.2
Total CHOL/HDL Ratio: 4
Triglycerides: 66 mg/dL (ref 0.0–149.0)
VLDL: 13.2 mg/dL (ref 0.0–40.0)

## 2013-11-05 LAB — CBC WITH DIFFERENTIAL/PLATELET
Basophils Absolute: 0.1 10*3/uL (ref 0.0–0.1)
Basophils Relative: 0.8 % (ref 0.0–3.0)
Eosinophils Absolute: 0.5 10*3/uL (ref 0.0–0.7)
Eosinophils Relative: 6.6 % — ABNORMAL HIGH (ref 0.0–5.0)
HCT: 41 % (ref 36.0–46.0)
Hemoglobin: 13.1 g/dL (ref 12.0–15.0)
Lymphocytes Relative: 29.3 % (ref 12.0–46.0)
Lymphs Abs: 2 10*3/uL (ref 0.7–4.0)
MCHC: 31.9 g/dL (ref 30.0–36.0)
MCV: 85.6 fl (ref 78.0–100.0)
Monocytes Absolute: 0.7 10*3/uL (ref 0.1–1.0)
Monocytes Relative: 9.8 % (ref 3.0–12.0)
Neutro Abs: 3.7 10*3/uL (ref 1.4–7.7)
Neutrophils Relative %: 53.5 % (ref 43.0–77.0)
Platelets: 269 10*3/uL (ref 150.0–400.0)
RBC: 4.79 Mil/uL (ref 3.87–5.11)
RDW: 13.9 % (ref 11.5–15.5)
WBC: 6.9 10*3/uL (ref 4.0–10.5)

## 2013-11-05 LAB — HEMOGLOBIN A1C: Hgb A1c MFr Bld: 5.9 % (ref 4.6–6.5)

## 2013-11-10 ENCOUNTER — Ambulatory Visit (HOSPITAL_COMMUNITY): Payer: No Typology Code available for payment source | Attending: Cardiovascular Disease | Admitting: Radiology

## 2013-11-10 VITALS — BP 128/81 | HR 71 | Ht 63.0 in | Wt 132.0 lb

## 2013-11-10 DIAGNOSIS — J449 Chronic obstructive pulmonary disease, unspecified: Secondary | ICD-10-CM | POA: Insufficient documentation

## 2013-11-10 DIAGNOSIS — Z720 Tobacco use: Secondary | ICD-10-CM

## 2013-11-10 DIAGNOSIS — R079 Chest pain, unspecified: Secondary | ICD-10-CM

## 2013-11-10 DIAGNOSIS — R002 Palpitations: Secondary | ICD-10-CM | POA: Insufficient documentation

## 2013-11-10 DIAGNOSIS — I2510 Atherosclerotic heart disease of native coronary artery without angina pectoris: Secondary | ICD-10-CM | POA: Insufficient documentation

## 2013-11-10 DIAGNOSIS — E785 Hyperlipidemia, unspecified: Secondary | ICD-10-CM

## 2013-11-10 DIAGNOSIS — R0789 Other chest pain: Secondary | ICD-10-CM | POA: Insufficient documentation

## 2013-11-10 DIAGNOSIS — I25119 Atherosclerotic heart disease of native coronary artery with unspecified angina pectoris: Secondary | ICD-10-CM

## 2013-11-10 DIAGNOSIS — F1910 Other psychoactive substance abuse, uncomplicated: Secondary | ICD-10-CM

## 2013-11-10 DIAGNOSIS — E079 Disorder of thyroid, unspecified: Secondary | ICD-10-CM

## 2013-11-10 DIAGNOSIS — I214 Non-ST elevation (NSTEMI) myocardial infarction: Secondary | ICD-10-CM

## 2013-11-10 MED ORDER — TECHNETIUM TC 99M SESTAMIBI GENERIC - CARDIOLITE
10.0000 | Freq: Once | INTRAVENOUS | Status: AC | PRN
  Administered 2013-11-10: 10 via INTRAVENOUS

## 2013-11-10 MED ORDER — TECHNETIUM TC 99M SESTAMIBI GENERIC - CARDIOLITE
30.0000 | Freq: Once | INTRAVENOUS | Status: AC | PRN
  Administered 2013-11-10: 30 via INTRAVENOUS

## 2013-11-10 NOTE — Progress Notes (Addendum)
[facility] [address] [place], [place] [address] [phone]    Cardiology Nuclear Med Study  [name] [name] is a 48 y.o. female     MRN : [id number]     DOB: 1965-04-12  Procedure Date: 11/10/2013  Nuclear Med Background Indication for Stress Test:  Evaluation for Ischemia History:  CAD, COPD Cardiac Risk Factors: N/A  Symptoms:  Chest Pain with Exertion (last date of chest discomfort was yesterday) and Palpitations   Nuclear Pre-Procedure Caffeine/Decaff Intake:  None NPO After: 8:00pm   Lungs:  clear O2 Sat: 96% on room air. IV 0.9% NS with Angio Cath:  22g  IV Site: R Antecubital  IV Started by:  Crissie Figures, RN  Chest Size (in):  34 Cup Size: B  Height: 5\' 3"  (1.6 m)  Weight:  132 lb (59.875 kg)  BMI:  Body mass index is 23.39 kg/(m^2). Tech Comments:  N/A    Nuclear Med Study 1 or 2 day study: 1 day  Stress Test Type:  Stress  Reading MD: N/A  Order Authorizing Provider:  Ena Dawley, MD  Resting Radionuclide: Technetium 62m Sestamibi  Resting Radionuclide Dose: 11.0 mCi   Stress Radionuclide:  Technetium 59m Sestamibi  Stress Radionuclide Dose: 33.0 mCi           Stress Protocol Rest HR: 71 Stress HR: 162  Rest BP: 128/81 Stress BP: 140/92  Exercise Time (min): 6:00 METS: 7.0           Dose of Adenosine (mg):  n/a Dose of Lexiscan: n/a mg  Dose of Atropine (mg): n/a Dose of Dobutamine: n/a mcg/kg/min (at max HR)  Stress Test Technologist: Glade Lloyd, BS-ES  Nuclear Technologist:  Vedia Pereyra, CNMT     Rest Procedure:  Myocardial perfusion imaging was performed at rest 45 minutes following the intravenous administration of Technetium 34m Sestamibi. Rest ECG: NSR - Normal EKG  Stress Procedure:  The patient exercised on the treadmill utilizing the Bruce Protocol for 6:00 minutes. The patient stopped due to SOB, fatigue and denied any chest pain.  Technetium 34m Sestamibi was injected at peak exercise and  myocardial perfusion imaging was performed after a brief delay. Stress ECG: No significant change from baseline ECG  QPS Raw Data Images:  Mild diaphragmatic attenuation.  Normal left ventricular size.  There is also interference from radiotracer uptake below the diaphragm. Stress Images:  There is decreased uptake in the inferior wall. Rest Images:  There is decreased uptake in the inferior wall. Subtraction (SDS):  There is a small sized, mild fixed mid and basal inferior defect that is most consistent with diaphragmatic attenuation and interfering gut on resting images. Transient Ischemic Dilatation (Normal <1.22):  0.91 Lung/Heart Ratio (Normal <0.45):  0.23  Quantitative Gated Spect Images QGS EDV:  64 ml QGS ESV:  24 ml  Impression Exercise Capacity:  Good exercise capacity. BP Response:  Hypotensive blood pressure response. Clinical Symptoms:  There is dyspnea. ECG Impression:  No significant ST segment change suggestive of ischemia. Comparison with Prior Nuclear Study: No images to compare  Overall Impression:  Low risk stress nuclear study with a small sized, mild fixed mid and basal inferior defect that is most consistent with diaphragmatic attenuation and increased gut uptake.  No evidence of ischemia.  Of note the patient's BP was 116/35mmHg in stage 3 and decreased to 140/69mmHg in Stage 2.  The next BP was in recovery at 173/28mmHg.  LV Ejection Fraction: 63%.  LV Wall Motion:  NL LV Function; NL Wall Motion  Signed: Fransico Him, MD [facility] 11/10/2013

## 2013-11-12 ENCOUNTER — Encounter (HOSPITAL_COMMUNITY): Payer: Self-pay

## 2013-11-12 ENCOUNTER — Telehealth: Payer: Self-pay | Admitting: Cardiology

## 2013-11-12 NOTE — Telephone Encounter (Signed)
New problem   Pt call for stress test and lab results. Please call pt.

## 2013-11-12 NOTE — Telephone Encounter (Signed)
Reviewed lab results and stress test with pt who states understanding.

## 2013-11-17 ENCOUNTER — Telehealth: Payer: Self-pay | Admitting: Emergency Medicine

## 2013-11-17 MED ORDER — BUDESONIDE-FORMOTEROL FUMARATE 160-4.5 MCG/ACT IN AERO: 2.0000 | INHALATION_SPRAY | Freq: Two times a day (BID) | RESPIRATORY_TRACT | Status: DC

## 2013-11-17 NOTE — Telephone Encounter (Signed)
Called pt. Aware we do not have any samples at this time. RX for symbicort has been sent to the pharmacy. Nothing further needed

## 2013-11-19 ENCOUNTER — Telehealth: Payer: Self-pay | Admitting: Emergency Medicine

## 2013-11-19 MED ORDER — BUDESONIDE-FORMOTEROL FUMARATE 160-4.5 MCG/ACT IN AERO: 2.0000 | INHALATION_SPRAY | Freq: Two times a day (BID) | RESPIRATORY_TRACT | Status: DC

## 2013-11-19 NOTE — Telephone Encounter (Signed)
Pt does not have insurance at this time and symbicort will cost $200 plus a month. I provided the pt with a few samples. She is in the process of getting set-up with [facility] and will be able to get her medication through them at that time.

## 2013-11-23 ENCOUNTER — Telehealth: Payer: Self-pay | Admitting: Cardiology

## 2013-11-23 NOTE — Telephone Encounter (Signed)
°  Patient normally takes aspirin 81 mg, she accidentally picked up 325 mg. Is that okay for her to take? Please call and advise.

## 2013-11-23 NOTE — Telephone Encounter (Signed)
Pt states she accidentally picked up the 325 mg ASA instead of the 81 mg baby ASA as prescribed by Dr Meda Coffee.  Pt would like to know if its ok to take the 325 mg ASA.  Pt states she has not taken one yet.  Informed the pt she should take the 81 mg Asprin po daily as prescribed by Dr Meda Coffee.  Pt verbalized understanding and agrees with this plan.

## 2013-11-29 ENCOUNTER — Encounter: Payer: Self-pay | Admitting: Cardiology

## 2013-12-02 ENCOUNTER — Other Ambulatory Visit: Payer: Self-pay | Admitting: Cardiology

## 2013-12-02 MED ORDER — NITROGLYCERIN 0.4 MG SL SUBL: 0.4000 mg | SUBLINGUAL_TABLET | SUBLINGUAL | Status: DC | PRN

## 2013-12-16 ENCOUNTER — Ambulatory Visit: Payer: Self-pay | Admitting: Cardiology

## 2013-12-17 ENCOUNTER — Ambulatory Visit (INDEPENDENT_AMBULATORY_CARE_PROVIDER_SITE_OTHER): Payer: No Typology Code available for payment source | Admitting: Cardiology

## 2013-12-17 ENCOUNTER — Emergency Department (HOSPITAL_COMMUNITY)
Admission: EM | Admit: 2013-12-17 | Discharge: 2013-12-17 | Disposition: A | Discharge status: Home / Self Care | Payer: No Typology Code available for payment source | Attending: Emergency Medicine | Admitting: Emergency Medicine

## 2013-12-17 ENCOUNTER — Emergency Department (HOSPITAL_COMMUNITY): Payer: No Typology Code available for payment source

## 2013-12-17 ENCOUNTER — Encounter: Payer: Self-pay | Admitting: *Deleted

## 2013-12-17 ENCOUNTER — Encounter (HOSPITAL_COMMUNITY): Payer: Self-pay | Admitting: *Deleted

## 2013-12-17 ENCOUNTER — Encounter: Payer: Self-pay | Admitting: Cardiology

## 2013-12-17 VITALS — BP 105/75 | HR 70 | Ht 63.0 in | Wt 134.0 lb

## 2013-12-17 DIAGNOSIS — R079 Chest pain, unspecified: Secondary | ICD-10-CM

## 2013-12-17 DIAGNOSIS — Z79899 Other long term (current) drug therapy: Secondary | ICD-10-CM | POA: Insufficient documentation

## 2013-12-17 DIAGNOSIS — E785 Hyperlipidemia, unspecified: Secondary | ICD-10-CM

## 2013-12-17 DIAGNOSIS — I209 Angina pectoris, unspecified: Secondary | ICD-10-CM

## 2013-12-17 DIAGNOSIS — E079 Disorder of thyroid, unspecified: Secondary | ICD-10-CM

## 2013-12-17 DIAGNOSIS — Z8659 Personal history of other mental and behavioral disorders: Secondary | ICD-10-CM | POA: Insufficient documentation

## 2013-12-17 DIAGNOSIS — I200 Unstable angina: Secondary | ICD-10-CM

## 2013-12-17 DIAGNOSIS — Z87891 Personal history of nicotine dependence: Secondary | ICD-10-CM | POA: Insufficient documentation

## 2013-12-17 DIAGNOSIS — I2510 Atherosclerotic heart disease of native coronary artery without angina pectoris: Secondary | ICD-10-CM | POA: Insufficient documentation

## 2013-12-17 DIAGNOSIS — Z7902 Long term (current) use of antithrombotics/antiplatelets: Secondary | ICD-10-CM | POA: Insufficient documentation

## 2013-12-17 DIAGNOSIS — Z8742 Personal history of other diseases of the female genital tract: Secondary | ICD-10-CM | POA: Insufficient documentation

## 2013-12-17 DIAGNOSIS — I252 Old myocardial infarction: Secondary | ICD-10-CM | POA: Insufficient documentation

## 2013-12-17 DIAGNOSIS — I25119 Atherosclerotic heart disease of native coronary artery with unspecified angina pectoris: Secondary | ICD-10-CM

## 2013-12-17 DIAGNOSIS — Z720 Tobacco use: Secondary | ICD-10-CM

## 2013-12-17 DIAGNOSIS — Z7982 Long term (current) use of aspirin: Secondary | ICD-10-CM | POA: Insufficient documentation

## 2013-12-17 DIAGNOSIS — Z8619 Personal history of other infectious and parasitic diseases: Secondary | ICD-10-CM | POA: Insufficient documentation

## 2013-12-17 DIAGNOSIS — J449 Chronic obstructive pulmonary disease, unspecified: Secondary | ICD-10-CM

## 2013-12-17 DIAGNOSIS — Z91040 Latex allergy status: Secondary | ICD-10-CM | POA: Insufficient documentation

## 2013-12-17 DIAGNOSIS — I214 Non-ST elevation (NSTEMI) myocardial infarction: Secondary | ICD-10-CM

## 2013-12-17 DIAGNOSIS — F1910 Other psychoactive substance abuse, uncomplicated: Secondary | ICD-10-CM

## 2013-12-17 DIAGNOSIS — Z862 Personal history of diseases of the blood and blood-forming organs and certain disorders involving the immune mechanism: Secondary | ICD-10-CM | POA: Insufficient documentation

## 2013-12-17 LAB — CBC WITH DIFFERENTIAL/PLATELET
BASOS ABS: 0 10*3/uL (ref 0.0–0.1)
BASOS PCT: 0 % (ref 0–1)
EOS PCT: 2 % (ref 0–5)
Eosinophils Absolute: 0.1 10*3/uL (ref 0.0–0.7)
HCT: 40.2 % (ref 36.0–46.0)
Hemoglobin: 13.4 g/dL (ref 12.0–15.0)
Lymphocytes Relative: 37 % (ref 12–46)
Lymphs Abs: 2.4 10*3/uL (ref 0.7–4.0)
MCH: 28.2 pg (ref 26.0–34.0)
MCHC: 33.3 g/dL (ref 30.0–36.0)
MCV: 84.6 fL (ref 78.0–100.0)
Monocytes Absolute: 0.6 10*3/uL (ref 0.1–1.0)
Monocytes Relative: 9 % (ref 3–12)
Neutro Abs: 3.4 10*3/uL (ref 1.7–7.7)
Neutrophils Relative %: 52 % (ref 43–77)
Platelets: 263 10*3/uL (ref 150–400)
RBC: 4.75 MIL/uL (ref 3.87–5.11)
RDW: 13.7 % (ref 11.5–15.5)
WBC: 6.5 10*3/uL (ref 4.0–10.5)

## 2013-12-17 LAB — BASIC METABOLIC PANEL
Anion gap: 13 (ref 5–15)
BUN: 7 mg/dL (ref 6–23)
CO2: 23 mEq/L (ref 19–32)
Calcium: 9.4 mg/dL (ref 8.4–10.5)
Chloride: 102 mEq/L (ref 96–112)
Creatinine, Ser: 0.94 mg/dL (ref 0.50–1.10)
GFR calc non Af Amer: 72 mL/min — ABNORMAL LOW (ref >90)
GFR, EST AFRICAN AMERICAN: 83 mL/min — AB (ref >90)
Glucose, Bld: 102 mg/dL — ABNORMAL HIGH (ref 70–99)
Potassium: 3.8 mEq/L (ref 3.7–5.3)
SODIUM: 138 meq/L (ref 137–147)

## 2013-12-17 LAB — I-STAT TROPONIN, ED
Troponin i, poc: 0 ng/mL (ref 0.00–0.08)
Troponin i, poc: 0 ng/mL (ref 0.00–0.08)

## 2013-12-17 MED ORDER — ACETAMINOPHEN 500 MG PO TABS
1000.0000 mg | ORAL_TABLET | Freq: Once | ORAL | Status: AC
  Administered 2013-12-17: 1000 mg via ORAL
  Filled 2013-12-17: qty 2

## 2013-12-17 MED ORDER — PREDNISONE 50 MG PO TABS: 50.0000 mg | ORAL_TABLET | Freq: Every day | ORAL | Status: DC

## 2013-12-17 MED ORDER — RANOLAZINE ER 500 MG PO TB12: 500.0000 mg | ORAL_TABLET | Freq: Two times a day (BID) | ORAL | Status: DC

## 2013-12-17 MED ORDER — ONDANSETRON HCL 4 MG/2ML IJ SOLN
4.0000 mg | Freq: Once | INTRAMUSCULAR | Status: AC
  Administered 2013-12-17: 4 mg via INTRAVENOUS
  Filled 2013-12-17: qty 2

## 2013-12-17 MED ORDER — OXYCODONE-ACETAMINOPHEN 5-325 MG PO TABS
1.0000 | ORAL_TABLET | Freq: Once | ORAL | Status: AC
  Administered 2013-12-17: 1 via ORAL
  Filled 2013-12-17: qty 1

## 2013-12-17 MED ORDER — MORPHINE SULFATE 4 MG/ML IJ SOLN
4.0000 mg | Freq: Once | INTRAMUSCULAR | Status: AC
  Administered 2013-12-17: 4 mg via INTRAVENOUS
  Filled 2013-12-17: qty 1

## 2013-12-17 MED ORDER — NITROGLYCERIN 0.4 MG SL SUBL
0.4000 mg | SUBLINGUAL_TABLET | SUBLINGUAL | Status: DC | PRN
  Administered 2013-12-17: 0.4 mg via SUBLINGUAL

## 2013-12-17 MED ORDER — OXYCODONE-ACETAMINOPHEN 5-325 MG PO TABS: 1.0000 | ORAL_TABLET | Freq: Four times a day (QID) | ORAL | Status: DC | PRN

## 2013-12-17 NOTE — ED Notes (Addendum)
Pt reports left side chest discomfort that started this am. Pain increases with movement, denies recent cough.  Has cardiac history, reports taking 1 nitro pta with minimal relief. Airway intact, ekg done.

## 2013-12-17 NOTE — Progress Notes (Signed)
Patient ID: [name] [name], female   DOB: [birth year], 48 y.o.   MRN: [id number] Patient ID: [name] [name], female   DOB: Mar 24, 1965, 48 y.o.   MRN: [id number]    [name] [name] Date of Birth: 11-25-1965 Medical Record #[id number]   Chief complain: Chest pain  History of Present Illness: [name] [name] is seen back today for a work in visit. Seen for Dr. Meda Coffee. 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 She did not require PCI. EF was normal. Other issues as noted. Has been in cardiac rehab and did well.  Comes back today. Here alone. She states that her shortness of breath and lower extremity edema has resolved. She has been taking Lasix 20 mg daily as needed. She denies any arm chest pain, orthopnea, PND. No syncope. She states that occasionally she will have a few second lasting palpitations they're not associated with any other symptoms such as dizziness or shortness of breath.  Sister did accompany her to the office but not to the room due to last OV note mentioning that she did not wish to have substance abuse issues discussed with anyone else present.   11/03/2013 - the patient reports recurrence of chest pain, especially on exertion, 3-4 times per week, much milder than when she had NSTEMI. No SOB, plapitations, syncope. The pain resolves within minutes with NTG.  12/17/2013 - the patient is coming to our office and states that her pain has significantly improved with using Ranolazine. However, while she is in office she develops left-sided chest pain radiating to her left arm. EKG performed in our office has new negative T waves in anterior leads, in addition to the negative T waves in the inferior that were present on the prior EKG. We gave her sublingual nitroglycerin that brought her pain from 8-4 out of 10. We will send the patient to the ER to rule out non-STEMI.    Current Outpatient Prescriptions  Medication Sig Dispense Refill  . acetaminophen (TYLENOL) 500 MG tablet Take 1,000  mg by mouth every 6 (six) hours as needed for mild pain.    Marland Kitchen albuterol (PROAIR HFA) 108 (90 BASE) MCG/ACT inhaler Inhale 2 puffs into the lungs every 4 (four) hours as needed for wheezing or shortness of breath. 1 Inhaler 11  . aspirin EC 81 MG EC tablet Take 1 tablet (81 mg total) by mouth daily.    . budesonide-formoterol (SYMBICORT) 160-4.5 MCG/ACT inhaler Inhale 2 puffs into the lungs 2 (two) times daily. 3 Inhaler 0  . carvedilol (COREG) 3.125 MG tablet Take 1 tablet (3.125 mg total) by mouth 2 (two) times daily with a meal. 60 tablet 0  . cetirizine (ZYRTEC) 10 MG tablet Take 10 mg by mouth daily.    . clopidogrel (PLAVIX) 75 MG tablet Take 1 tablet (75 mg total) by mouth daily with breakfast. 30 tablet 1  . furosemide (LASIX) 20 MG tablet Take 1 tablet (20 mg total) by mouth daily as needed. 20 tablet 3  . nitroGLYCERIN (NITROSTAT) 0.4 MG SL tablet Place 1 tablet (0.4 mg total) under the tongue every 5 (five) minutes x 3 doses as needed for chest pain. 25 tablet 3  . potassium chloride SA (K-DUR,KLOR-CON) 20 MEQ tablet 1/2 Tablet as needed with lasix 30 tablet 6  . pravastatin (PRAVACHOL) 40 MG tablet Take 1 tablet (40 mg total) by mouth daily. 30 tablet 1  . ranolazine (RANEXA) 500 MG 12 hr tablet Take 1 tablet (500 mg total) by  mouth 2 (two) times daily. 90 tablet 3   No current facility-administered medications for this visit.    Allergies  Allergen Reactions  . Demerol Itching  . Novocain [Procaine Hcl] Swelling    Past Medical History  Diagnosis Date  . Abnormal Pap smear     Colpo (twice)  . Anemia   . Chlamydia     years ago  . Trichomonas     years ago  . Breast disorder     soreness in left breast   . Depression   . CAD (coronary artery disease)     a. 01/2013 NSTEMI/Cath: LM nl, LAD 86m, LCX nl, RI nl, RCA nl, PDA ostial nonocclusive intraluminal thrombus, EF 65%, Treated with heparin x 48 hrs->Med Rx (occurred in setting of + cocaine);  b. 01/2013 Echo: EF 60-65%,  nl wall motion.  . Polysubstance abuse     tobacco/cocaine/marijuana (+ UDS 01/2013).  Marland Kitchen Heart disease     Past Surgical History  Procedure Laterality Date  . Breast surgery      lump removed from right breast  . Endometrial ablation w/ novasure    . Groin mass open biopsy    . Cervical cone biopsy    . Cesarean section      2 c-sections  . Ectopic pregnancy surgery      History  Smoking status  . Former Smoker -- 0.50 packs/day for 31 years  . Types: Cigarettes  . Quit date: 02/05/2013  Smokeless tobacco  . Never Used    Comment: February 05, 2013    History  Alcohol Use  . Yes    Comment: rarely    Family History  Problem Relation Age of Onset  . Breast cancer Mother   . Hypertension Brother   . Kidney failure Brother   . Hypertension Sister     Review of Systems: The review of systems is per the HPI.  All other systems were reviewed and are negative.  Physical Exam: BP 105/75 mmHg  Pulse 70  Ht 5\' 3"  (1.6 m)  Wt 134 lb (60.782 kg)  BMI 23.74 kg/m2  SpO2 99% Patient is pleasant and in no acute distress. Skin is warm and dry. Color is normal.  HEENT is unremarkable. Normocephalic/atraumatic. PERRL. Sclera are nonicteric. Neck is supple. No masses. No JVD. Lungs are clear. Cardiac exam shows a regular rate and rhythm. Abdomen is soft. Extremities are with trace lower extremity edema. Gait and ROM are intact. No gross neurologic deficits noted.  Wt Readings from Last 3 Encounters:  12/17/13 134 lb (60.782 kg)  11/10/13 132 lb (59.875 kg)  11/03/13 133 lb (60.328 kg)     LABORATORY DATA: PENDING  Lab Results  Component Value Date   WBC 6.9 11/05/2013   HGB 13.1 11/05/2013   HCT 41.0 11/05/2013   PLT 269.0 11/05/2013   GLUCOSE 100* 11/05/2013   CHOL 149 11/05/2013   TRIG 66.0 11/05/2013   HDL 41.80 11/05/2013   LDLCALC 94 11/05/2013   ALT 13 11/05/2013   AST 16 11/05/2013   NA 136 11/05/2013   K 3.7 11/05/2013   CL 106 11/05/2013   CREATININE  0.9 11/05/2013   BUN 9 11/05/2013   CO2 24 11/05/2013   TSH 0.32* 03/01/2013   INR 1.13 02/06/2013   HGBA1C 5.9 11/05/2013    Exercise nuclear stress test: 11/10/2013 Quantitative Gated Spect Images QGS EDV: 64 ml QGS ESV: 24 ml  Impression Exercise Capacity: Good exercise capacity. BP Response: Hypotensive  blood pressure response. Clinical Symptoms: There is dyspnea. ECG Impression: No significant ST segment change suggestive of ischemia. Comparison with Prior Nuclear Study: No images to compare  Overall Impression: Low risk stress nuclear study with a small sized, mild fixed mid and basal inferior defect that is most consistent with diaphragmatic attenuation and increased gut uptake. No evidence of ischemia. Of note the patient's BP was 116/62mmHg in stage 3 and decreased to 140/34mmHg in Stage 2. The next BP was in recovery at 173/42mmHg.  LV Ejection Fraction: 63%. LV Wall Motion: NL LV Function; NL Wall Motion    Assessment / Plan:  1. NSTEMI - with nonocclusive intraluminal thrombus - managed with heparin and remains on chronic Plavix - with recurrent chest pain 3-4/week, responding to NTG. Since she haven't got a stent I will schedule an exercise nuclear stress test. Improved with ranolazine 500 mg po BID (free samples given for a trial for 8 weeks).   However unstable angina today with new negative T waves in the anterior leads, pain slightly improved with sublingual nitroglycerin. Patient is being sent to the ER to rule out non-STEMI. If negative she can be sent home and we will follow.  2. LE Swelling - resolved , we'll check CMP today, lasix PRN. BNP was normal.  3. HLD - labs from last month reviewed with her. She has had a nice response to her statin. Check liver enzymes today  4. Elevated HbA1c - 3 months ago, we will repeat today  We will follow the patient after discharge from the hospital, total's time spent with the patient today one hour.  Dorothy Spark 12/17/2013

## 2013-12-17 NOTE — ED Notes (Signed)
Pt given lunch per chris laywer, pac and instructed to take morning meds

## 2013-12-17 NOTE — ED Notes (Signed)
Pt reports no change in chest pain s/p nitro.  C/o headache

## 2013-12-17 NOTE — ED Provider Notes (Signed)
CSN: [id number]     Arrival date & time 12/17/13  2025 History   First MD Initiated Contact with Patient 12/17/13 0945     Chief Complaint  Patient presents with  . Chest Pain    HPI: [name] [name] is a 48 year old African American female with PMH of CAD (NSTEMI 01/2013 in setting of cocaine use w/ plaque rupture and thrombus formation in PDA) and substance abuse who presents to the ED on 12/17/2013 for chest pain that started at 7:30 this morning. She says the pain is constant and is in her left pectoral region. She says it is a "sharp" pain and rates it as an 8/10. Reports the pain being worse with deep breaths and changing positions (from lying down to sitting up). She did not consume breakfast this morning due to having a scheduled Cardiology appointment this morning and possible lab work. The patient went to her appointment today and according to Dr. Francesca Oman note, the EKG showed new negative T waves in the anterior leads. The patient says she was given sublingual NTG at the office and says her pain level went to a 4-5/10, but says the relief was temporary. She says this episode is unlike the chest pain she experienced in January because she had radiating pain and numbness down her left arm at that time and she currently denies those symptoms. She also denies dizziness, headaches, syncope, shortness of breath, coughing, wheezing, rhinorrhea, palpitations, orthopnea, PND, abdominal pain, nausea, vomiting, diarrhea, constipation, and urinary changes.  Following her NSTEMI in January, the patient has been followed by Dr. Meda Coffee in [facility]. The patient says she experienced several episodes of angina throughout the past several months and her records show she underwent stress testing in October 2015 for evaluation of her symptoms.  The study showed a small sized, mild fixed mid and basal inferior defect that was most consistent with diaphragmatic attenuation and increased gut uptake but no  ischemia changes were noted. She was started on Renexa around that time and reports good compliance with the medication and says it usually controls her symptoms.  She reports a 15 pack year tobacco history but quit smoking in January 2015. She reports daily Marijuana use and occasional alcohol use.  Past Medical History  Diagnosis Date  . Abnormal Pap smear     Colpo (twice)  . Anemia   . Chlamydia     years ago  . Trichomonas     years ago  . Breast disorder     soreness in left breast   . Depression   . CAD (coronary artery disease)     a. 01/2013 NSTEMI/Cath: LM nl, LAD 58m, LCX nl, RI nl, RCA nl, PDA ostial nonocclusive intraluminal thrombus, EF 65%, Treated with heparin x 48 hrs->Med Rx (occurred in setting of + cocaine);  b. 01/2013 Echo: EF 60-65%, nl wall motion.  . Polysubstance abuse     tobacco/cocaine/marijuana (+ UDS 01/2013).  Marland Kitchen Heart disease    Past Surgical History  Procedure Laterality Date  . Breast surgery      lump removed from right breast  . Endometrial ablation w/ novasure    . Groin mass open biopsy    . Cervical cone biopsy    . Cesarean section      2 c-sections  . Ectopic pregnancy surgery     Family History  Problem Relation Age of Onset  . Breast cancer Mother   . Hypertension Brother   .  Kidney failure Brother   . Hypertension Sister    History  Substance Use Topics  . Smoking status: Former Smoker -- 0.50 packs/day for 31 years    Types: Cigarettes    Quit date: 02/05/2013  . Smokeless tobacco: Never Used     Comment: February 05, 2013  . Alcohol Use: Yes     Comment: rarely   OB History    Gravida Para Term Preterm AB TAB SAB Ectopic Multiple Living   4 2 2  2  1 1  2      Review of Systems: All other systems negative except as documented in the HPI. All pertinent positives and negatives as reviewed in the HPI.   Allergies  Demerol; Latex; and Novocain  Home Medications   Prior to Admission medications   Medication Sig Start  Date End Date Taking? Authorizing Provider  acetaminophen (TYLENOL) 500 MG tablet Take 1,000 mg by mouth every 6 (six) hours as needed for mild pain.   Yes Historical Provider, MD  aspirin EC 81 MG EC tablet Take 1 tablet (81 mg total) by mouth daily. 02/12/13  Yes Rogelia Mire, NP  budesonide-formoterol [facility]) 160-4.5 MCG/ACT inhaler Inhale 2 puffs into the lungs 2 (two) times daily. 11/19/13  Yes Collene Gobble, MD  carvedilol (COREG) 3.125 MG tablet Take 1 tablet (3.125 mg total) by mouth 2 (two) times daily with a meal. 10/18/13  Yes Dorothy Spark, MD  cetirizine (ZYRTEC) 10 MG tablet Take 10 mg by mouth daily.   Yes Historical Provider, MD  clopidogrel (PLAVIX) 75 MG tablet Take 1 tablet (75 mg total) by mouth daily with breakfast. 10/18/13  Yes Dorothy Spark, MD  furosemide (LASIX) 20 MG tablet Take 1 tablet (20 mg total) by mouth daily as needed. 03/30/13  Yes Burtis Junes, NP  nitroGLYCERIN (NITROSTAT) 0.4 MG SL tablet Place 1 tablet (0.4 mg total) under the tongue every 5 (five) minutes x 3 doses as needed for chest pain. 12/02/13  Yes Dorothy Spark, MD  potassium chloride SA (K-DUR,KLOR-CON) 20 MEQ tablet 1/2 Tablet as needed with lasix 05/03/13  Yes Dorothy Spark, MD  pravastatin (PRAVACHOL) 40 MG tablet Take 1 tablet (40 mg total) by mouth daily. 10/18/13  Yes Dorothy Spark, MD  ranolazine (RANEXA) 500 MG 12 hr tablet Take 1 tablet (500 mg total) by mouth 2 (two) times daily. 12/17/13  Yes Dorothy Spark, MD  albuterol (PROAIR HFA) 108 (90 BASE) MCG/ACT inhaler Inhale 2 puffs into the lungs every 4 (four) hours as needed for wheezing or shortness of breath. 06/03/13   Collene Gobble, MD   BP 124/81 mmHg  Pulse 64  Temp(Src) 98.2 F (36.8 C) (Oral)  Resp 15  SpO2 100% Physical Exam  Constitutional: She is oriented to person, place, and time. She appears well-developed and well-nourished.  HENT:  Head: Normocephalic and atraumatic.  Eyes: Conjunctivae are  normal. Pupils are equal, round, and reactive to light.  Neck: Normal range of motion. Neck supple. No JVD present.  Cardiovascular: Normal rate, regular rhythm, normal heart sounds and intact distal pulses.  Exam reveals no gallop and no friction rub.   No murmur heard. Pulmonary/Chest: Effort normal and breath sounds normal. No accessory muscle usage or stridor. No respiratory distress. She has no wheezes. She has no rales. She exhibits no tenderness.  Abdominal: Soft. Bowel sounds are normal. She exhibits no distension and no mass. There is no tenderness. There is no  rebound and no guarding.  Lymphadenopathy:    She has no cervical adenopathy.  Neurological: She is alert and oriented to person, place, and time.  Skin: Skin is warm and dry. No rash noted. No erythema. No pallor.  Psychiatric: She has a normal mood and affect. Her behavior is normal.  Nursing note and vitals reviewed.   ED Course  Procedures (including critical care time) Labs Review Labs Reviewed  CBC WITH DIFFERENTIAL  BASIC METABOLIC PANEL  I-STAT [place], ED    Imaging Review No results found.   EKG Interpretation   Date/Time:  Friday December 17 2013 09:36:27 EST Ventricular Rate:  80 PR Interval:  134 QRS Duration: 70 QT Interval:  378 QTC Calculation: 435 R Axis:   58 Text Interpretation:  Normal sinus rhythm Normal ECG resolution of deep  t-wave inversion in aVF and II Confirmed by Maryan Rued  MD, Loree Fee ([id number])  on 12/17/2013 10:10:37 AM      MDM   Final diagnoses:  Chest pain   Spoke with cardiology about the patient and they feel that she can be discharged home if her second troponin is negative.  They will follow up with her in their clinic.  The patient most likely has a pleuritic type chest pain rather than cardiac, based on her history of present illness and physical exam, patient is advised to return here for any worsening in her condition   Brent General, PA-C 12/21/13  [place], MD 12/26/13 2300

## 2013-12-17 NOTE — Discharge Instructions (Signed)
Return here as needed. Follow up with your doctor. °

## 2013-12-17 NOTE — Patient Instructions (Signed)
Your physician recommends that you continue on your current medications as directed. Please refer to the Current Medication list given to you today.    Your physician recommends that you schedule a follow-up appointment in: 3 MONTHS WITH DR NELSON  

## 2013-12-20 ENCOUNTER — Telehealth: Payer: Self-pay | Admitting: Cardiology

## 2013-12-20 NOTE — Telephone Encounter (Signed)
New message    Patient went to emergency room on Friday by Dr. Meda Coffee wants to know what the next step - follow up or should she keep her original appt in Feb.

## 2013-12-20 NOTE — Telephone Encounter (Signed)
Spoke with the pt and she was calling Dr Meda Coffee to inform her that she went to the ED last week and they diagnosed her with Pleurisy with unknown cause, and prescribed her Prednisone and Percocet.  Pt states she is feeling better and taking all her meds as prescribed.  Pt would like to know if Dr Meda Coffee would like for her to come into a follow-up OV before her already scheduled one on 03/21/14.  Informed the pt that Dr Meda Coffee is out of the office at this time, but I will route this message to her for further review and recommendation and follow-up thereafter.  Pt verbalized understanding and agrees with this plan.

## 2013-12-20 NOTE — Telephone Encounter (Signed)
Informed the pt that per Dr Meda Coffee she would like to see her in the office in 3 weeks for follow-up of her Pleurisy.  Informed the pt that I will send my scheduler a message to contact her and have this appt set up.  Pt verbalized understanding and agrees with this plan.

## 2013-12-20 NOTE — Telephone Encounter (Signed)
I would like to see her in 3 weeks.

## 2013-12-29 ENCOUNTER — Ambulatory Visit (INDEPENDENT_AMBULATORY_CARE_PROVIDER_SITE_OTHER): Payer: No Typology Code available for payment source | Admitting: Family Medicine

## 2013-12-29 ENCOUNTER — Encounter: Payer: Self-pay | Admitting: Family Medicine

## 2013-12-29 ENCOUNTER — Telehealth: Payer: Self-pay | Admitting: *Deleted

## 2013-12-29 VITALS — BP 127/81 | HR 80 | Temp 98.1°F | Ht 63.5 in | Wt 131.8 lb

## 2013-12-29 DIAGNOSIS — F329 Major depressive disorder, single episode, unspecified: Secondary | ICD-10-CM

## 2013-12-29 DIAGNOSIS — F418 Other specified anxiety disorders: Secondary | ICD-10-CM

## 2013-12-29 DIAGNOSIS — F419 Anxiety disorder, unspecified: Principal | ICD-10-CM

## 2013-12-29 DIAGNOSIS — F32A Depression, unspecified: Secondary | ICD-10-CM

## 2013-12-29 MED ORDER — CITALOPRAM HYDROBROMIDE 20 MG PO TABS: 20.0000 mg | ORAL_TABLET | Freq: Every day | ORAL | Status: DC

## 2013-12-29 NOTE — Patient Instructions (Addendum)
Thank you for coming in,   Please follow up with me in two weeks. If you have any problems with the celexa, please let me know.   If you have any symptoms that we discussed then please let me know.    Please feel free to call with any questions or concerns at any time, at [phone]. --Dr. Raeford Razor

## 2013-12-29 NOTE — Progress Notes (Signed)
Subjective:    Patient ID: [name] [name], female    DOB: Nov 07, 1965, 48 y.o.   MRN: [id number]  HPI  Messina Kosinski is here for est care.   Patient had a MI in Jan 2015 and has since made changes in her life. She has quit smoking and taking medications associated.   Depression: has a history of. She was previous on celexa and tolerated it well. She denies any SI/HI.  She denies any manic type symptoms. Initially diagnosed when she was 48 yo. She has been referred to Behavioral health in the past but doesn't like going there.   Current Outpatient Prescriptions on File Prior to Visit  Medication Sig Dispense Refill  . acetaminophen (TYLENOL) 500 MG tablet Take 1,000 mg by mouth every 6 (six) hours as needed for mild pain.    Marland Kitchen albuterol (PROAIR HFA) 108 (90 BASE) MCG/ACT inhaler Inhale 2 puffs into the lungs every 4 (four) hours as needed for wheezing or shortness of breath. 1 Inhaler 11  . aspirin EC 81 MG EC tablet Take 1 tablet (81 mg total) by mouth daily.    . budesonide-formoterol (SYMBICORT) 160-4.5 MCG/ACT inhaler Inhale 2 puffs into the lungs 2 (two) times daily. 3 Inhaler 0  . carvedilol (COREG) 3.125 MG tablet Take 1 tablet (3.125 mg total) by mouth 2 (two) times daily with a meal. 60 tablet 0  . cetirizine (ZYRTEC) 10 MG tablet Take 10 mg by mouth daily.    . clopidogrel (PLAVIX) 75 MG tablet Take 1 tablet (75 mg total) by mouth daily with breakfast. 30 tablet 1  . furosemide (LASIX) 20 MG tablet Take 1 tablet (20 mg total) by mouth daily as needed. 20 tablet 3  . nitroGLYCERIN (NITROSTAT) 0.4 MG SL tablet Place 1 tablet (0.4 mg total) under the tongue every 5 (five) minutes x 3 doses as needed for chest pain. 25 tablet 3  . oxyCODONE-acetaminophen (PERCOCET/ROXICET) 5-325 MG per tablet Take 1 tablet by mouth every 6 (six) hours as needed for severe pain. 15 tablet 0  . potassium chloride SA (K-DUR,KLOR-CON) 20 MEQ tablet 1/2 Tablet as needed with lasix 30 tablet 6  . pravastatin  (PRAVACHOL) 40 MG tablet Take 1 tablet (40 mg total) by mouth daily. 30 tablet 1  . ranolazine (RANEXA) 500 MG 12 hr tablet Take 1 tablet (500 mg total) by mouth 2 (two) times daily. 90 tablet 6  . predniSONE (DELTASONE) 50 MG tablet Take 1 tablet (50 mg total) by mouth daily with breakfast. (Patient not taking: Reported on 12/29/2013) 5 tablet 0   No current facility-administered medications on file prior to visit.    SHx: hx of tobacco abuse but quit in Jan. 2015  Health Maintenance: up to date    Review of Systems See HPI     Objective:   Physical Exam BP 127/81 mmHg  Pulse 80  Temp(Src) 98.1 F (36.7 C) (Oral)  Ht 5' 3.5" (1.613 m)  Wt 131 lb 12.8 oz (59.784 kg)  BMI 22.98 kg/m2  LMP  Gen: NAD, alert, cooperative with exam,  HEENT: NCAT, PERRL, clear conjunctiva, oropharynx clear, supple neck CV: RRR, good S1/S2, no murmur,  capillary refill brisk  Resp: CTABL, no wheezes, non-labored Abd: SNTND, BS present, no guarding or organomegaly Skin: no rashes, normal turgor   PHQ-9: 24 with somewhat difficulty   GAD-7: 17 with somewhat difficulty   Mood disorder questionnaire:  1. Yes 10/13 2. Yes 3. Moderate problem  Assessment & Plan:

## 2013-12-29 NOTE — Telephone Encounter (Signed)
New message  Pt had not decided on taking the vacc this year or not; she was given the information and will call later to sched.

## 2014-01-01 ENCOUNTER — Encounter: Payer: Self-pay | Admitting: Family Medicine

## 2014-01-01 DIAGNOSIS — F329 Major depressive disorder, single episode, unspecified: Secondary | ICD-10-CM | POA: Insufficient documentation

## 2014-01-01 DIAGNOSIS — F419 Anxiety disorder, unspecified: Secondary | ICD-10-CM | POA: Insufficient documentation

## 2014-01-01 DIAGNOSIS — F32A Depression, unspecified: Secondary | ICD-10-CM

## 2014-01-01 HISTORY — DX: Anxiety disorder, unspecified: F41.9

## 2014-01-01 HISTORY — DX: Depression, unspecified: F32.A

## 2014-01-01 NOTE — Assessment & Plan Note (Signed)
PHQ-9 (severe depression) and GAD-7 indicating probable anxiety d/o. She has been diagnosed when she was 48 yo. Been to behavioral health previously but doesn't want to go back.  - celexa started  - f/u in 2 weeks

## 2014-01-04 ENCOUNTER — Telehealth: Payer: Self-pay

## 2014-01-04 NOTE — Telephone Encounter (Signed)
Called for Health Coaching and follow up from Doctors appointment. Patient stated that was happy with the doctor's appointment. Patient stated that they did labs at Cardiology office and Hgb A1C was 5.9 in October. Patient stated that she was told that labs were good. I told her that was better than 6.0 but still in the prediabetic range. We discussed how she was eating and stressed to her about carbohydrates and sugar. She stated that she was doing good. Informed patient that A1C really needed to be below 5.7 to be in the normal range. Spent 15 minutes discussing activity and healthy eating related to [place].

## 2014-01-06 ENCOUNTER — Encounter (HOSPITAL_COMMUNITY): Payer: Self-pay | Admitting: Interventional Cardiology

## 2014-01-10 ENCOUNTER — Ambulatory Visit (INDEPENDENT_AMBULATORY_CARE_PROVIDER_SITE_OTHER): Payer: No Typology Code available for payment source | Admitting: Cardiology

## 2014-01-10 ENCOUNTER — Encounter: Payer: Self-pay | Admitting: Cardiology

## 2014-01-10 VITALS — BP 120/70 | HR 82 | Ht 63.5 in | Wt 132.0 lb

## 2014-01-10 DIAGNOSIS — E785 Hyperlipidemia, unspecified: Secondary | ICD-10-CM

## 2014-01-10 DIAGNOSIS — I2583 Coronary atherosclerosis due to lipid rich plaque: Secondary | ICD-10-CM

## 2014-01-10 DIAGNOSIS — I2510 Atherosclerotic heart disease of native coronary artery without angina pectoris: Secondary | ICD-10-CM

## 2014-01-10 DIAGNOSIS — I252 Old myocardial infarction: Secondary | ICD-10-CM

## 2014-01-10 DIAGNOSIS — I309 Acute pericarditis, unspecified: Secondary | ICD-10-CM

## 2014-01-10 MED ORDER — COLCHICINE 0.6 MG PO TABS: ORAL_TABLET | ORAL | Status: DC

## 2014-01-10 NOTE — Patient Instructions (Addendum)
Your physician recommends that you schedule a follow-up appointment in: [place] physician has recommended you make the following change in your medication:   1) START Colchicine 0.6 mg by mouth twice a day for 2 months

## 2014-01-10 NOTE — Progress Notes (Signed)
Patient ID: [name] [name], female   DOB: [birth year], 48 y.o.   MRN: [id number]    [name] [name] Date of Birth: 01-30-1965 Medical Record #[id number]   Chief complain: Chest pain  History of Present Illness: [name] [name] is seen back today for a work in visit. Seen for Dr. Meda Coffee. 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 She did not require PCI. EF was normal. Other issues as noted. Has been in cardiac rehab and did well.  Comes back today. Here alone. She states that her shortness of breath and lower extremity edema has resolved. She has been taking Lasix 20 mg daily as needed. She denies any arm chest pain, orthopnea, PND. No syncope. She states that occasionally she will have a few second lasting palpitations they're not associated with any other symptoms such as dizziness or shortness of breath.  Sister did accompany her to the office but not to the room due to last OV note mentioning that she did not wish to have substance abuse issues discussed with anyone else present.   11/03/2013 - the patient reports recurrence of chest pain, especially on exertion, 3-4 times per week, much milder than when she had NSTEMI. No SOB, plapitations, syncope. The pain resolves within minutes with NTG.  12/17/2013 - the patient is coming to our office and states that her pain has significantly improved with using Ranolazine. However, while she is in office she develops left-sided chest pain radiating to her left arm. EKG performed in our office has new negative T waves in anterior leads, in addition to the negative T waves in the inferior that were present on the prior EKG. We gave her sublingual nitroglycerin that brought her pain from 8-4 out of 10. We will send the patient to the ER to rule out non-STEMI.    01/10/2014 -  The patient was sent to the ER and her last office visit on 12/17/2013 for acute chest pain and EKG changes.  Myocardial infarction was ruled out and she was diagnosed with  pericarditis and started on 5 day prednisone taper. Her symptoms have typically resolved and she hasn't experienced any chest pain ever since. She has any lower extremity edema, palpitations syncope orthopnea or proximal nocturnal dyspnea.   Current Outpatient Prescriptions  Medication Sig Dispense Refill  . acetaminophen (TYLENOL) 500 MG tablet Take 1,000 mg by mouth every 6 (six) hours as needed for mild pain.    Marland Kitchen albuterol (PROAIR HFA) 108 (90 BASE) MCG/ACT inhaler Inhale 2 puffs into the lungs every 4 (four) hours as needed for wheezing or shortness of breath. 1 Inhaler 11  . aspirin EC 81 MG EC tablet Take 1 tablet (81 mg total) by mouth daily.    . budesonide-formoterol (SYMBICORT) 160-4.5 MCG/ACT inhaler Inhale 2 puffs into the lungs 2 (two) times daily. 3 Inhaler 0  . carvedilol (COREG) 3.125 MG tablet Take 1 tablet (3.125 mg total) by mouth 2 (two) times daily with a meal. 60 tablet 0  . cetirizine (ZYRTEC) 10 MG tablet Take 10 mg by mouth daily.    . citalopram (CELEXA) 20 MG tablet Take 1 tablet (20 mg total) by mouth daily. 30 tablet 1  . clopidogrel (PLAVIX) 75 MG tablet Take 1 tablet (75 mg total) by mouth daily with breakfast. 30 tablet 1  . furosemide (LASIX) 20 MG tablet Take 1 tablet (20 mg total) by mouth daily as needed. 20 tablet 3  . nitroGLYCERIN (NITROSTAT) 0.4 MG SL tablet Place  1 tablet (0.4 mg total) under the tongue every 5 (five) minutes x 3 doses as needed for chest pain. 25 tablet 3  . potassium chloride SA (K-DUR,KLOR-CON) 20 MEQ tablet 1/2 Tablet as needed with lasix 30 tablet 6  . pravastatin (PRAVACHOL) 40 MG tablet Take 1 tablet (40 mg total) by mouth daily. 30 tablet 1  . ranolazine (RANEXA) 500 MG 12 hr tablet Take 1 tablet (500 mg total) by mouth 2 (two) times daily. 90 tablet 6   No current facility-administered medications for this visit.    Allergies  Allergen Reactions  . Demerol Itching  . Latex Itching  . Novocain [Procaine Hcl] Swelling     Past Medical History  Diagnosis Date  . Abnormal Pap smear     Colpo (twice)  . Anemia   . Chlamydia     years ago  . Trichomonas     years ago  . Breast disorder     soreness in left breast   . Depression   . CAD (coronary artery disease)     a. 01/2013 NSTEMI/Cath: LM nl, LAD 36m, LCX nl, RI nl, RCA nl, PDA ostial nonocclusive intraluminal thrombus, EF 65%, Treated with heparin x 48 hrs->Med Rx (occurred in setting of + cocaine);  b. 01/2013 Echo: EF 60-65%, nl wall motion.  . Polysubstance abuse     tobacco/cocaine/marijuana (+ UDS 01/2013).  Marland Kitchen Heart disease     MI Jan 2015  . Anxiety and depression 01/01/2014    Past Surgical History  Procedure Laterality Date  . Breast surgery      lump removed from right breast  . Endometrial ablation w/ novasure    . Groin mass open biopsy    . Cervical cone biopsy    . Cesarean section      2 c-sections  . Ectopic pregnancy surgery    . Left heart catheterization with coronary angiogram N/A 02/08/2013    Procedure: LEFT HEART CATHETERIZATION WITH CORONARY ANGIOGRAM;  Surgeon: Sinclair Grooms, MD;  Location: [facility] CATH LAB;  Service: Cardiovascular;  Laterality: N/A;    History  Smoking status  . Former Smoker -- 0.50 packs/day for 31 years  . Types: Cigarettes  . Quit date: 02/05/2013  Smokeless tobacco  . Never Used    Comment: February 05, 2013    History  Alcohol Use  . 0.0 oz/week  . 0 Not specified per week    Comment: rarely    Family History  Problem Relation Age of Onset  . Breast cancer Mother   . Hypertension Brother   . Kidney failure Brother   . Hypertension Sister     Review of Systems: The review of systems is per the HPI.  All other systems were reviewed and are negative.  Physical Exam: BP 120/70 mmHg  Pulse 82  Ht 5' 3.5" (1.613 m)  Wt 132 lb (59.875 kg)  BMI 23.01 kg/m2  SpO2 98% Patient is pleasant and in no acute distress. Skin is warm and dry. Color is normal.  HEENT is unremarkable.  Normocephalic/atraumatic. PERRL. Sclera are nonicteric. Neck is supple. No masses. No JVD. Lungs are clear. Cardiac exam shows a regular rate and rhythm. Abdomen is soft. Extremities are with trace lower extremity edema. Gait and ROM are intact. No gross neurologic deficits noted.  Wt Readings from Last 3 Encounters:  01/10/14 132 lb (59.875 kg)  12/29/13 131 lb 12.8 oz (59.784 kg)  12/17/13 134 lb (60.782 kg)    LABORATORY  DATA: PENDING  Lab Results  Component Value Date   WBC 6.5 12/17/2013   HGB 13.4 12/17/2013   HCT 40.2 12/17/2013   PLT 263 12/17/2013   GLUCOSE 102* 12/17/2013   CHOL 149 11/05/2013   TRIG 66.0 11/05/2013   HDL 41.80 11/05/2013   LDLCALC 94 11/05/2013   ALT 13 11/05/2013   AST 16 11/05/2013   NA 138 12/17/2013   K 3.8 12/17/2013   CL 102 12/17/2013   CREATININE 0.94 12/17/2013   BUN 7 12/17/2013   CO2 23 12/17/2013   TSH 0.32* 03/01/2013   INR 1.13 02/06/2013   HGBA1C 5.9 11/05/2013    Exercise nuclear stress test: 11/10/2013 Quantitative Gated Spect Images QGS EDV: 64 ml QGS ESV: 24 ml  Impression Exercise Capacity: Good exercise capacity. BP Response: Hypotensive blood pressure response. Clinical Symptoms: There is dyspnea. ECG Impression: No significant ST segment change suggestive of ischemia. Comparison with Prior Nuclear Study: No images to compare  Overall Impression: Low risk stress nuclear study with a small sized, mild fixed mid and basal inferior defect that is most consistent with diaphragmatic attenuation and increased gut uptake. No evidence of ischemia. Of note the patient's BP was 116/6mmHg in stage 3 and decreased to 140/64mmHg in Stage 2. The next BP was in recovery at 173/70mmHg.  LV Ejection Fraction: 63%. LV Wall Motion: NL LV Function; NL Wall Motion    Assessment / Plan:  1. NSTEMI - with nonocclusive intraluminal thrombus - managed with heparin and remains on chronic Plavix - with recurrent chest pain  3-4/week, responding to NTG. Since she haven't got a stent I will schedule an exercise nuclear stress test. Improved with ranolazine 500 mg po BID (free samples given for a trial for 8 weeks).   However unstable angina today with new negative T waves in the anterior leads, pain slightly improved with sublingual nitroglycerin. Patient is being sent to the ER to rule out non-STEMI. If negative she can be sent home and we will follow.  2. Acute pericarditis on 12/17/13,  Resolved with prednisone taper, we will start colchicine 0.6 by mouth twice a day for 2 months to avoid recurrence.  3. LE Swelling - resolved.  4. HLD - labs from last month reviewed with her. Liver enzymes normal in October.  4. Elevated HbA1c - repeat 5.9%  Follow up in 2 months.  Dorothy Spark 01/10/2014

## 2014-01-12 ENCOUNTER — Ambulatory Visit (INDEPENDENT_AMBULATORY_CARE_PROVIDER_SITE_OTHER): Payer: No Typology Code available for payment source | Admitting: Family Medicine

## 2014-01-12 VITALS — BP 114/70 | HR 80 | Temp 98.7°F | Ht 64.0 in | Wt 131.3 lb

## 2014-01-12 DIAGNOSIS — F329 Major depressive disorder, single episode, unspecified: Secondary | ICD-10-CM

## 2014-01-12 DIAGNOSIS — F419 Anxiety disorder, unspecified: Principal | ICD-10-CM

## 2014-01-12 DIAGNOSIS — F418 Other specified anxiety disorders: Secondary | ICD-10-CM

## 2014-01-12 DIAGNOSIS — F32A Depression, unspecified: Secondary | ICD-10-CM

## 2014-01-12 MED ORDER — SERTRALINE HCL 50 MG PO TABS: 50.0000 mg | ORAL_TABLET | Freq: Every day | ORAL | Status: DC

## 2014-01-12 NOTE — Progress Notes (Signed)
   Subjective:    Patient ID: [name] [name], female    DOB: 1965-06-19, 48 y.o.   MRN: [id number]  HPI  [name] [name] is here for depression f/u.  Depression: was started on celexa on previous visit since she had been on it before. She reports feeling of lightheadedness. Last took it about a week ago. Still feeling down. She doesn't have a reason as to why she feels this way. She didn't feel this way yesterday.    Current Outpatient Prescriptions on File Prior to Visit  Medication Sig Dispense Refill  . acetaminophen (TYLENOL) 500 MG tablet Take 1,000 mg by mouth every 6 (six) hours as needed for mild pain.    Marland Kitchen albuterol (PROAIR HFA) 108 (90 BASE) MCG/ACT inhaler Inhale 2 puffs into the lungs every 4 (four) hours as needed for wheezing or shortness of breath. 1 Inhaler 11  . aspirin EC 81 MG EC tablet Take 1 tablet (81 mg total) by mouth daily.    . budesonide-formoterol (SYMBICORT) 160-4.5 MCG/ACT inhaler Inhale 2 puffs into the lungs 2 (two) times daily. 3 Inhaler 0  . carvedilol (COREG) 3.125 MG tablet Take 1 tablet (3.125 mg total) by mouth 2 (two) times daily with a meal. 60 tablet 0  . cetirizine (ZYRTEC) 10 MG tablet Take 10 mg by mouth daily.    . citalopram (CELEXA) 20 MG tablet Take 1 tablet (20 mg total) by mouth daily. 30 tablet 1  . clopidogrel (PLAVIX) 75 MG tablet Take 1 tablet (75 mg total) by mouth daily with breakfast. 30 tablet 1  . colchicine 0.6 MG tablet Take twice a day for 2 months. 60 tablet 2  . furosemide (LASIX) 20 MG tablet Take 1 tablet (20 mg total) by mouth daily as needed. 20 tablet 3  . nitroGLYCERIN (NITROSTAT) 0.4 MG SL tablet Place 1 tablet (0.4 mg total) under the tongue every 5 (five) minutes x 3 doses as needed for chest pain. 25 tablet 3  . potassium chloride SA (K-DUR,KLOR-CON) 20 MEQ tablet 1/2 Tablet as needed with lasix 30 tablet 6  . pravastatin (PRAVACHOL) 40 MG tablet Take 1 tablet (40 mg total) by mouth daily. 30 tablet 1  . ranolazine (RANEXA)  500 MG 12 hr tablet Take 1 tablet (500 mg total) by mouth 2 (two) times daily. 90 tablet 6   No current facility-administered medications on file prior to visit.    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   Health Maintenance: none   Review of Systems See HPI     Objective:   Physical Exam BP 114/70 mmHg  Pulse 80  Temp(Src) 98.7 F (37.1 C) (Oral)  Ht 5\' 4"  (1.626 m)  Wt 131 lb 4.8 oz (59.557 kg)  BMI 22.53 kg/m2  LMP 05/13/2013 Gen: NAD, alert, cooperative with exam, well-appearing HEENT: TM clear on left, Cerumen impacted on right.  Psych: good insight, alert and oriented      Assessment & Plan:

## 2014-01-12 NOTE — Patient Instructions (Signed)
Thank you for coming in,   I started zoloft today. Please let me know how you tolerate it.   Mineral oil or debrox can be purchased over the counter for ear wax removal.   Please follow up with me in two weeks.    Please feel free to call with any questions or concerns at any time, at [phone]. --Dr. Raeford Razor

## 2014-01-13 ENCOUNTER — Telehealth: Payer: Self-pay | Admitting: Cardiology

## 2014-01-13 DIAGNOSIS — E785 Hyperlipidemia, unspecified: Secondary | ICD-10-CM

## 2014-01-13 DIAGNOSIS — I25119 Atherosclerotic heart disease of native coronary artery with unspecified angina pectoris: Secondary | ICD-10-CM

## 2014-01-13 NOTE — Telephone Encounter (Signed)
Pt said that on her last office visit Dr. Meda Coffee prescribed Colchicine 0.6 mg twice a day for 2 months. Pt said that this medication is too expensive and she can't afford. Pt would like for Dr. Meda Coffee to prescribed some other medication that she ca afford.

## 2014-01-13 NOTE — Telephone Encounter (Signed)
Returned call to patient no answer.Unable to leave a message no voice mail. ?

## 2014-01-13 NOTE — Telephone Encounter (Signed)
New Msg     Pt calling states she needs someone to call about last medication prescribed to her. Please call at [phone].

## 2014-01-13 NOTE — Telephone Encounter (Signed)
Follow up ° ° ° ° °Returning a nurses call °

## 2014-01-14 ENCOUNTER — Encounter: Payer: Self-pay | Admitting: Family Medicine

## 2014-01-14 MED ORDER — IBUPROFEN 200 MG PO TABS: 200.0000 mg | ORAL_TABLET | Freq: Two times a day (BID) | ORAL | Status: DC

## 2014-01-14 NOTE — Telephone Encounter (Signed)
Please advise her to take ibuprofen 400 mg po BID x 4 weeks, she will need BMP prior to the next visit

## 2014-01-14 NOTE — Telephone Encounter (Signed)
Pt informed that clarification obtained per Dr Meda Coffee that she should take Ibuprofen 200 mg po BID x 3 weeks only. Also informed the pt that per Dr Meda Coffee she would like for her to come in for [facility] before her next OV with Dr Meda Coffee on 03-21-14.  Scheduled the pt for lab appt to check BMP for 03-14-14.  Pt education provided on S/S of excess bleeding to be aware of.  Pt verbalized understanding and agrees with this plan.

## 2014-01-14 NOTE — Telephone Encounter (Signed)
Calling pt to inform her that per Dr Meda Coffee, she should take Ibuprofen 400 mg po BID x 4 weeks, and will need a BMP prior to the next visit.  Pt states that she was told at the hospital when she was placed on Plavix, that she is to never take any NSAIDS.  Pt states she would like to get clarification on taking the Ibuprofen with her Plavix per Dr Meda Coffee.  Informed the pt I will obtain clarification on this and follow-up thereafter. Pt verbalized understanding and agrees with this plan.

## 2014-01-14 NOTE — Assessment & Plan Note (Signed)
Didn't tolerate celexa and stop using it about a week ago. Still feeling down and wanting to start a medication.  - stopped celexa  - started zoloft 50 mg  - f/u in 2-3 weeks.

## 2014-01-26 ENCOUNTER — Ambulatory Visit: Payer: No Typology Code available for payment source | Admitting: Emergency Medicine

## 2014-02-04 ENCOUNTER — Encounter: Payer: Self-pay | Admitting: Emergency Medicine

## 2014-02-04 ENCOUNTER — Ambulatory Visit (INDEPENDENT_AMBULATORY_CARE_PROVIDER_SITE_OTHER): Payer: No Typology Code available for payment source | Admitting: Emergency Medicine

## 2014-02-04 VITALS — BP 104/60 | HR 78 | Temp 99.3°F | Ht 63.0 in | Wt 135.0 lb

## 2014-02-04 DIAGNOSIS — J449 Chronic obstructive pulmonary disease, unspecified: Secondary | ICD-10-CM

## 2014-02-04 NOTE — Progress Notes (Signed)
   Subjective:    Patient ID: [name] [name], female    DOB: [birth year], 49 y.o.   MRN: [id number]  HPI 49 yo woman, former smoker (20 pk-yrs), CAD, depression. She is referred by Dr Meda Coffee for dyspnea. She was admitted with a NSTEMI in 1/15, c/b VT. Cath showed a non-occlusive thrombus, no PCI done. He sx at the time were arm pain. Since then she has experienced exertional SOB.  No cough. Rare wheeze with exertion. She has allergies, loratadine not helping her.   ROV 07/05/13 -- returns for f/u dyspnea. Last time we started symbicort based on AFL + BD response. Added nasal steroid to loratadine. a1-AT was MM. She feels that the symbicort has helped her. Able to do more exertion, still has some DOE with certain activities. She does respond to albuterol - uses less than once a day. She tried the nasonex, has used prn.   ROV 02/04/14 -- follow-up visit for obstructive lung disease with a bronchodilator response on spirometry. She is doing well on Symbicort scheduled. Uses her albuterol rarely. She is staying active, likes to bowl, works full time. No flares. Hasn't had the flu shot shot yet.    Review of Systems  Constitutional: Negative for fever and unexpected weight change.  HENT: Positive for sneezing. Negative for congestion, dental problem, ear pain, nosebleeds, postnasal drip, rhinorrhea, sinus pressure, sore throat and trouble swallowing.   Eyes: Negative for redness and itching.  Respiratory: Positive for shortness of breath. Negative for cough, chest tightness and wheezing.   Cardiovascular: Positive for palpitations and leg swelling.  Gastrointestinal: Negative for nausea and vomiting.  Genitourinary: Negative for dysuria.  Musculoskeletal: Positive for joint swelling.       Pain and stiffness  Skin: Negative for rash.  Neurological: Negative for headaches.  Hematological: Bruises/bleeds easily.  Psychiatric/Behavioral: Positive for dysphoric mood. The patient is nervous/anxious.        Objective:   Physical Exam Filed Vitals:   02/04/14 1519 02/04/14 1521  BP:  104/60  Pulse:  78  Temp: 99.3 F (37.4 C)   TempSrc: Oral   Height: 5\' 3"  (1.6 m)   Weight: 135 lb (61.236 kg)   SpO2:  98%   Gen: Pleasant, well-nourished, in no distress,  normal affect  ENT: No lesions,  mouth clear,  oropharynx clear, no postnasal drip  Neck: No JVD, no TMG, no carotid bruits  Lungs: No use of accessory muscles, clear without rales or rhonchi  Cardiovascular: RRR, heart sounds normal, no murmur or gallops, no peripheral edema  Musculoskeletal: No deformities, no cyanosis or clubbing  Neuro: alert, non focal  Skin: Warm, no lesions or rashes     Assessment & Plan:  COPD (chronic obstructive pulmonary disease) Has been stable, no flares or new problems.   Please continue your current inhaled medications as you have been taking them Future flu shot from family practice as planned You may want to try using your albuterol before exercise           We may consider changing her Symbicort at sometime in the future to an alternative. We will not make that change right now Continue your Zyrtec. Usually your Nasonex as needed         Follow with Dr Lamonte Sakai in 6 months or sooner if you have any problems

## 2014-02-04 NOTE — Assessment & Plan Note (Signed)
Has been stable, no flares or new problems.   Please continue your current inhaled medications as you have been taking them Future flu shot from family practice as planned You may want to try using your albuterol before exercise           We may consider changing her Symbicort at sometime in the future to an alternative. We will not make that change right now Continue your Zyrtec. Usually your Nasonex as needed         Follow with Dr Lamonte Sakai in 6 months or sooner if you have any problems

## 2014-02-04 NOTE — Patient Instructions (Signed)
Please continue your current inhaled medications as you have been taking them Future flu shot from family practice as planned You may want to try using your albuterol before exercise           We may consider changing her Symbicort at sometime in the future to an alternative. We will not make that change right now Continue your Zyrtec. Usually your Nasonex as needed         Follow with Dr Lamonte Sakai in 6 months or sooner if you have any problems

## 2014-02-21 ENCOUNTER — Telehealth: Payer: Self-pay | Admitting: Cardiology

## 2014-02-21 DIAGNOSIS — I25119 Atherosclerotic heart disease of native coronary artery with unspecified angina pectoris: Secondary | ICD-10-CM

## 2014-02-21 DIAGNOSIS — I214 Non-ST elevation (NSTEMI) myocardial infarction: Secondary | ICD-10-CM

## 2014-02-21 DIAGNOSIS — E785 Hyperlipidemia, unspecified: Secondary | ICD-10-CM

## 2014-02-21 DIAGNOSIS — R079 Chest pain, unspecified: Secondary | ICD-10-CM

## 2014-02-21 DIAGNOSIS — I209 Angina pectoris, unspecified: Secondary | ICD-10-CM

## 2014-02-21 DIAGNOSIS — J449 Chronic obstructive pulmonary disease, unspecified: Secondary | ICD-10-CM

## 2014-02-21 DIAGNOSIS — E079 Disorder of thyroid, unspecified: Secondary | ICD-10-CM

## 2014-02-21 DIAGNOSIS — Z720 Tobacco use: Secondary | ICD-10-CM

## 2014-02-21 DIAGNOSIS — F1910 Other psychoactive substance abuse, uncomplicated: Secondary | ICD-10-CM

## 2014-02-21 NOTE — Telephone Encounter (Signed)
New EMssage   Pt calling about samples of ranexa. Please call back and discuss.

## 2014-02-22 ENCOUNTER — Telehealth: Payer: Self-pay | Admitting: Cardiology

## 2014-02-22 MED ORDER — RANOLAZINE ER 500 MG PO TB12: 500.0000 mg | ORAL_TABLET | Freq: Two times a day (BID) | ORAL | Status: DC

## 2014-02-22 NOTE — Telephone Encounter (Signed)
Walk in pt form " needs to speak with nurse about Ranexa" gave to [facility]

## 2014-02-22 NOTE — Telephone Encounter (Signed)
New Msg ° ° ° ° ° ° °Pt returning call. ° ° °Please call back. °

## 2014-02-22 NOTE — Telephone Encounter (Signed)
New Message  Pt called request the samples of Lenexa 1000 MG. Offered to refer this pt to refills Dept. Pt states that she advised per that department that they not allowed to give her the 1000mg  of Lenexa so she would like to speak with a nurse because she was given the sample on a prior OV. Please call

## 2014-02-22 NOTE — Telephone Encounter (Signed)
Pt calling requesting samples of Ranexa until she gets paid on the 10 th of Feb to go and pick-up this prescription.  Per the pt she states she was told to contact our office in regards to getting Ranexa 500 mg po BID.  Pt states she was told that it was ok to request for the Ranexa 1000 mg, and advised to break this in 1/2 to equal the Ranexa 500 mg po bid.  Informed the pt that she cannot break the 1000 mg in 1/2 for this is SR.  Clarification obtained by our PharmD Gay Filler that we cannot break the 1000 mg in 1/2.  Informed the pt that I will send her a 90 day supply of Ranexa 500 mg po BID to [facility] on [address] for her to pick-up.  Informed the pt that if this medication is not cost effective for her, then she should contact our office back to further obtain another medication to supplement in the place of this.  Pt verbalized understanding and agrees with this plan.

## 2014-02-22 NOTE — Telephone Encounter (Signed)
Pt calling to inform Dr Meda Coffee that she went to [facility] to pick up her Ranexa 500 mg po bid today, and with her insurance the cost of this medication will be $400 a month.  Pt states this is not cost effective for her. Pt would like for Dr Meda Coffee to advise on something different for her to take in place of the Ranexa. Informed the pt that I will route this message to Dr Meda Coffee for further review and new order and follow-up with her thereafter.  Pt verbalized understanding and agrees with this plan.

## 2014-02-23 MED ORDER — ISOSORBIDE MONONITRATE ER 30 MG PO TB24: 30.0000 mg | ORAL_TABLET | Freq: Every day | ORAL | Status: DC

## 2014-02-23 NOTE — Telephone Encounter (Signed)
Please give her imdur 30 mg po daily instead.

## 2014-02-23 NOTE — Telephone Encounter (Signed)
Notified the pt that per Dr Meda Coffee she should d/c the Ranexa and start Imdur 30 mg po daily due to cost effectiveness.  Confirmed the pharmacy of choice with the pt.  Advised the pt to purchase a BP monitor, and monitor this as needed or when symptomatic. Pt verbalized understanding and agrees with this plan.

## 2014-02-26 ENCOUNTER — Telehealth: Payer: Self-pay | Admitting: Physician Assistant

## 2014-02-26 NOTE — Telephone Encounter (Signed)
The patient called stating the Imdur is causing terrible headaches.  I recommended she take 15 mg of the Imdru and if the HA subside, trying adding the other half in the morning.  Leotha Westermeyer, PA-C

## 2014-03-10 ENCOUNTER — Other Ambulatory Visit: Payer: Self-pay | Admitting: Family Medicine

## 2014-03-10 ENCOUNTER — Encounter: Payer: Self-pay | Admitting: Family Medicine

## 2014-03-10 ENCOUNTER — Ambulatory Visit (INDEPENDENT_AMBULATORY_CARE_PROVIDER_SITE_OTHER): Payer: No Typology Code available for payment source | Admitting: Family Medicine

## 2014-03-10 VITALS — BP 153/97 | HR 76 | Temp 98.2°F | Ht 63.0 in | Wt 134.1 lb

## 2014-03-10 DIAGNOSIS — R109 Unspecified abdominal pain: Secondary | ICD-10-CM

## 2014-03-10 DIAGNOSIS — N951 Menopausal and female climacteric states: Secondary | ICD-10-CM

## 2014-03-10 DIAGNOSIS — E0590 Thyrotoxicosis, unspecified without thyrotoxic crisis or storm: Secondary | ICD-10-CM

## 2014-03-10 DIAGNOSIS — R1010 Upper abdominal pain, unspecified: Secondary | ICD-10-CM

## 2014-03-10 DIAGNOSIS — R232 Flushing: Secondary | ICD-10-CM

## 2014-03-10 DIAGNOSIS — E079 Disorder of thyroid, unspecified: Secondary | ICD-10-CM

## 2014-03-10 DIAGNOSIS — K30 Functional dyspepsia: Secondary | ICD-10-CM

## 2014-03-10 LAB — COMPREHENSIVE METABOLIC PANEL
ALT: 16 U/L (ref 0–35)
AST: 15 U/L (ref 0–37)
Albumin: 4.1 g/dL (ref 3.5–5.2)
Alkaline Phosphatase: 58 U/L (ref 39–117)
BILIRUBIN TOTAL: 0.4 mg/dL (ref 0.2–1.2)
BUN: 8 mg/dL (ref 6–23)
CALCIUM: 9.5 mg/dL (ref 8.4–10.5)
CO2: 28 mEq/L (ref 19–32)
Chloride: 106 mEq/L (ref 96–112)
Creat: 0.86 mg/dL (ref 0.50–1.10)
GLUCOSE: 97 mg/dL (ref 70–99)
Potassium: 3.8 mEq/L (ref 3.5–5.3)
Sodium: 141 mEq/L (ref 135–145)
Total Protein: 7.3 g/dL (ref 6.0–8.3)

## 2014-03-10 LAB — POCT H PYLORI SCREEN: H PYLORI SCREEN, POC: NEGATIVE

## 2014-03-10 LAB — LIPASE: Lipase: 23 U/L (ref 0–75)

## 2014-03-10 MED ORDER — SIMETHICONE 125 MG PO TABS: ORAL_TABLET | ORAL | Status: DC

## 2014-03-10 NOTE — Patient Instructions (Addendum)
Thank you for coming in,   Please let me know if the spot in your ear doesn't get better in 2-4 weeks from now.   Lets try the simethicone and keep a diary of when your pain occurs so we can better assess what might be causing it.    Please feel free to call with any questions or concerns at any time, at [phone]. --Dr. Raeford Razor

## 2014-03-11 ENCOUNTER — Telehealth: Payer: Self-pay | Admitting: Family Medicine

## 2014-03-11 DIAGNOSIS — R109 Unspecified abdominal pain: Secondary | ICD-10-CM | POA: Insufficient documentation

## 2014-03-11 DIAGNOSIS — R232 Flushing: Secondary | ICD-10-CM | POA: Insufficient documentation

## 2014-03-11 LAB — TSH: TSH: 0.178 u[IU]/mL — AB (ref 0.350–4.500)

## 2014-03-11 LAB — T4, FREE: Free T4: 1.2 ng/dL (ref 0.80–1.80)

## 2014-03-11 LAB — T3, FREE: T3, Free: 3.5 pg/mL (ref 2.3–4.2)

## 2014-03-11 NOTE — Assessment & Plan Note (Signed)
Hot flashes could be a symptoms of thyroid. She is already on metoprolol. She could be consider high risk with her hx of NSTEMI and CAD  - Will measure TSH, free t3 and free t4 in six months  - if TSH consistently low, could consider a thyroid radionuclide scan

## 2014-03-11 NOTE — Progress Notes (Signed)
Subjective:    Patient ID: [name] [name], female    DOB: 08-May-1965, 49 y.o.   MRN: [id number]  HPI  [name] [name] is here for stomach pain. 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   Stomach pain: started about three weeks ago. Feels like a contraction. Occurring in her centralized upper abdomen. It occurs everyday and not related to food or position. Denies any vomiting, diarrhea or constipation. No caffeine or EtOH. Doesn't take NSAIDS on a regular basis. Last bowel movement was yesterday. Has a bowel movement at least every other day. No dysuria or vaginal discharge. Has taken a friends omeprazole and that helped. Does have some chills and nausea. No fevers, night sweats. Hx of c-section and a tubal ligation.   Hot flashes: occurring for about a year. Seem to be getting more intense. She will also break out into a sweat. These symptoms occur all day. They are worse at night. Her cycles have been intermittent with spotting about every three months. Her mother was diagnosed with breast cancer at 47 yo and past away at 47 yo. She currently gets mammograms every 6 six months.   Thyroid dysfunction: hx of having low TSH but not taking any medications. Not complaining of any symptoms. Hasn't had her TSH measured in some time.   Current Outpatient Prescriptions on File Prior to Visit  Medication Sig Dispense Refill  . acetaminophen (TYLENOL) 500 MG tablet Take 1,000 mg by mouth every 6 (six) hours as needed for mild pain.    Marland Kitchen albuterol (PROAIR HFA) 108 (90 BASE) MCG/ACT inhaler Inhale 2 puffs into the lungs every 4 (four) hours as needed for wheezing or shortness of breath. 1 Inhaler 11  . aspirin EC 81 MG EC tablet Take 1 tablet (81 mg total) by mouth daily.    . budesonide-formoterol (SYMBICORT) 160-4.5 MCG/ACT inhaler Inhale 2 puffs into the lungs 2 (two) times daily. 3 Inhaler 0  . carvedilol (COREG) 3.125  MG tablet Take 1 tablet (3.125 mg total) by mouth 2 (two) times daily with a meal. 60 tablet 0  . cetirizine (ZYRTEC) 10 MG tablet Take 10 mg by mouth daily.    . clopidogrel (PLAVIX) 75 MG tablet Take 1 tablet (75 mg total) by mouth daily with breakfast. 30 tablet 1  . furosemide (LASIX) 20 MG tablet Take 1 tablet (20 mg total) by mouth daily as needed. 20 tablet 3  . ibuprofen (ADVIL) 200 MG tablet Take 1 tablet (200 mg total) by mouth 2 (two) times daily. TAKE FOR 3 WEEKS ONLY 30 tablet 0  . isosorbide mononitrate (IMDUR) 30 MG 24 hr tablet Take 1 tablet (30 mg total) by mouth daily. 90 tablet 3  . nitroGLYCERIN (NITROSTAT) 0.4 MG SL tablet Place 1 tablet (0.4 mg total) under the tongue every 5 (five) minutes x 3 doses as needed for chest pain. 25 tablet 3  . potassium chloride SA (K-DUR,KLOR-CON) 20 MEQ tablet 1/2 Tablet as needed with lasix 30 tablet 6  . pravastatin (PRAVACHOL) 40 MG tablet Take 1 tablet (40 mg total) by mouth daily. 30 tablet 1   No current facility-administered medications on file prior to visit.    Review of Systems See HPI     Objective:   Physical Exam BP 153/97 mmHg  Pulse 76  Temp(Src) 98.2 F (36.8 C) (Oral)  Ht 5\' 3"  (1.6 m)  Wt 134 lb 1.6 oz (60.827 kg)  BMI 23.76 kg/m2 Gen: NAD, alert, cooperative with exam, well-appearing HEENT: no goiter palpated  CV: RRR, good S1/S2, no murmur,  Resp: CTABL, no wheezes, non-labored Abd: SNTND, BS present, no guarding or organomegaly, no rebound or guarding,  Skin: no rashes, normal turgor  Neuro: no gross deficits.   BP re-check: 154/94       Assessment & Plan:

## 2014-03-11 NOTE — Telephone Encounter (Signed)
Called patient about labs. Informed TSH is low and thatt we are waiting on Free t3 and free t4. Based on those results will determine our plan of treatment.   Rosemarie Ax, MD PGY-2, [facility] 03/11/2014, 2:54 PM

## 2014-03-11 NOTE — Assessment & Plan Note (Signed)
Would classify as mild to moderate currently. She is on an anti-depressant already, could consider increasing instead of trying any hormone alternatives. Her mother passed at age of 75 from breast cancer and the patient recently had an NSTEMI last year.  - will continue to monitor for now and discuss risks and options.

## 2014-03-11 NOTE — Assessment & Plan Note (Addendum)
Most likely 2/2 to gas from what it sounds like and based on exam.  - lipase nml - H pylori neg  - LFT's normal  - try simethicone, if no improvement may need to try PPI or referral to Gi for EGD

## 2014-03-14 ENCOUNTER — Other Ambulatory Visit (INDEPENDENT_AMBULATORY_CARE_PROVIDER_SITE_OTHER): Payer: Self-pay | Admitting: *Deleted

## 2014-03-14 DIAGNOSIS — E785 Hyperlipidemia, unspecified: Secondary | ICD-10-CM

## 2014-03-14 DIAGNOSIS — I25119 Atherosclerotic heart disease of native coronary artery with unspecified angina pectoris: Secondary | ICD-10-CM

## 2014-03-15 ENCOUNTER — Telehealth: Payer: Self-pay | Admitting: *Deleted

## 2014-03-15 DIAGNOSIS — E876 Hypokalemia: Secondary | ICD-10-CM

## 2014-03-15 LAB — BASIC METABOLIC PANEL
BUN: 8 mg/dL (ref 6–23)
CO2: 31 mEq/L (ref 19–32)
Calcium: 9.5 mg/dL (ref 8.4–10.5)
Chloride: 105 mEq/L (ref 96–112)
Creatinine, Ser: 0.99 mg/dL (ref 0.40–1.20)
GFR: 77.29 mL/min (ref >60.00)
Glucose, Bld: 110 mg/dL — ABNORMAL HIGH (ref 70–99)
Potassium: 3.4 mEq/L — ABNORMAL LOW (ref 3.5–5.1)
Sodium: 140 mEq/L (ref 135–145)

## 2014-03-15 MED ORDER — POTASSIUM CHLORIDE CRYS ER 20 MEQ PO TBCR: 20.0000 meq | EXTENDED_RELEASE_TABLET | Freq: Every day | ORAL | Status: DC

## 2014-03-15 NOTE — Telephone Encounter (Signed)
-----   Message from Dorothy Spark, MD sent at 03/15/2014  4:49 PM EST ----- Increase KCL from 10 to 20 mEq daily.

## 2014-03-15 NOTE — Telephone Encounter (Signed)
Informed the pt that per Dr Meda Coffee all her labs were normal except for her K.  Informed the pt that per Dr Meda Coffee she should increase her KCL from 10 mEq to 20 mEq po daily.  Pt states she is currently on KCL 20 mEq as needed to take with her lasix for edema.  Informed the pt that she should take KCL 20 mEq daily until further advised by Dr Meda Coffee at [facility] for next week 03/23/14.  Informed the pt that I will send new med dose to her pharmacy of choice.  Informed the pt that I will also make Dr Meda Coffee aware that she was only taking the KCL as needed with the Lasix, and was advised to take the 20 mEq daily until OV next week with her on 2/24.  Pt verbalized understanding and agrees with this plan.

## 2014-03-16 ENCOUNTER — Telehealth: Payer: Self-pay | Admitting: Family Medicine

## 2014-03-16 ENCOUNTER — Encounter: Payer: Self-pay | Admitting: Family Medicine

## 2014-03-16 NOTE — Telephone Encounter (Signed)
Pt. Called about test results would like for Dr. Raeford Razor to return call and discuss / sr

## 2014-03-17 NOTE — Telephone Encounter (Signed)
Pt called back still waiting for her results of labs.

## 2014-03-21 ENCOUNTER — Ambulatory Visit: Payer: Self-pay | Admitting: Cardiology

## 2014-03-23 ENCOUNTER — Ambulatory Visit (INDEPENDENT_AMBULATORY_CARE_PROVIDER_SITE_OTHER): Payer: Self-pay | Admitting: Cardiology

## 2014-03-23 ENCOUNTER — Encounter: Payer: Self-pay | Admitting: Gastroenterology

## 2014-03-23 ENCOUNTER — Encounter: Payer: Self-pay | Admitting: Cardiology

## 2014-03-23 VITALS — BP 98/58 | HR 79 | Ht 63.0 in | Wt 131.0 lb

## 2014-03-23 DIAGNOSIS — R109 Unspecified abdominal pain: Secondary | ICD-10-CM

## 2014-03-23 DIAGNOSIS — R1084 Generalized abdominal pain: Secondary | ICD-10-CM

## 2014-03-23 DIAGNOSIS — R079 Chest pain, unspecified: Secondary | ICD-10-CM

## 2014-03-23 DIAGNOSIS — I252 Old myocardial infarction: Secondary | ICD-10-CM

## 2014-03-23 DIAGNOSIS — I10 Essential (primary) hypertension: Secondary | ICD-10-CM

## 2014-03-23 MED ORDER — RANOLAZINE ER 500 MG PO TB12: 500.0000 mg | ORAL_TABLET | Freq: Two times a day (BID) | ORAL | Status: DC

## 2014-03-23 MED ORDER — ISOSORBIDE MONONITRATE ER 30 MG PO TB24: 15.0000 mg | ORAL_TABLET | Freq: Every day | ORAL | Status: DC

## 2014-03-23 MED ORDER — AMLODIPINE BESYLATE 2.5 MG PO TABS: 2.5000 mg | ORAL_TABLET | Freq: Every day | ORAL | Status: DC

## 2014-03-23 NOTE — Patient Instructions (Addendum)
Your physician has recommended you make the following change in your medication:   START TAKING IMDUR 15 MG ONCE DAILY---THIS COMES IN 30 MG TABS SO TAKE 1/2 TAB TO EQUAL 15 MG ONCE DAILY     You have been referred to [facility] [place] wants you to follow-up in: [place] will receive a reminder letter in the mail two months in advance. If you don't receive a letter, please call our office to schedule the follow-up appointment.

## 2014-03-23 NOTE — Progress Notes (Signed)
Patient ID: [name] [name], female   DOB: 12-05-1965, 49 y.o.   MRN: [id number]    [name] [name] Date of Birth: 09-05-1965 Medical Record #[id number]   Chief complain: Chest pain  History of Present Illness: [name] [name] is seen back today for a work in visit. Seen for Dr. Meda Coffee. 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 She did not require PCI. EF was normal. Other issues as noted. Has been in cardiac rehab and did well.  Comes back today. Here alone. She states that her shortness of breath and lower extremity edema has resolved. She has been taking Lasix 20 mg daily as needed. She denies any arm chest pain, orthopnea, PND. No syncope. She states that occasionally she will have a few second lasting palpitations they're not associated with any other symptoms such as dizziness or shortness of breath.  Sister did accompany her to the office but not to the room due to last OV note mentioning that she did not wish to have substance abuse issues discussed with anyone else present.   11/03/2013 - the patient reports recurrence of chest pain, especially on exertion, 3-4 times per week, much milder than when she had NSTEMI. No SOB, plapitations, syncope. The pain resolves within minutes with NTG.  12/17/2013 - the patient is coming to our office and states that her pain has significantly improved with using Ranolazine. However, while she is in office she develops left-sided chest pain radiating to her left arm. EKG performed in our office has new negative T waves in anterior leads, in addition to the negative T waves in the inferior that were present on the prior EKG. We gave her sublingual nitroglycerin that brought her pain from 8-4 out of 10. We will send the patient to the ER to rule out non-STEMI.    01/10/2014 -  The patient was sent to the ER and her last office visit on 12/17/2013 for acute chest pain and EKG changes.  Myocardial infarction was ruled out and she was diagnosed with  pericarditis and started on 5 day prednisone taper. Her symptoms have typically resolved and she hasn't experienced any chest pain ever since. She has any lower extremity edema, palpitations syncope orthopnea or proximal nocturnal dyspnea.  03/23/2014 - the patient is coming after 2 months, she finished prednisone and colchicine and her pericardiac pain has resolved. She has a meeting experiencing March exertional chest pain while Imdur however Imdur gave her significant headaches and she would like to discontinue.  The patient has been experiencing epigastric pain and cramping that is not related to food intake. No weight loss, blood in the stool nausea or vomiting.    Current Outpatient Prescriptions  Medication Sig Dispense Refill  . acetaminophen (TYLENOL) 500 MG tablet Take 1,000 mg by mouth every 6 (six) hours as needed for mild pain.    Marland Kitchen albuterol (PROAIR HFA) 108 (90 BASE) MCG/ACT inhaler Inhale 2 puffs into the lungs every 4 (four) hours as needed for wheezing or shortness of breath. 1 Inhaler 11  . aspirin EC 81 MG EC tablet Take 1 tablet (81 mg total) by mouth daily.    . budesonide-formoterol (SYMBICORT) 160-4.5 MCG/ACT inhaler Inhale 2 puffs into the lungs 2 (two) times daily. 3 Inhaler 0  . carvedilol (COREG) 3.125 MG tablet Take 1 tablet (3.125 mg total) by mouth 2 (two) times daily with a meal. 60 tablet 0  . cetirizine (ZYRTEC) 10 MG tablet Take 10 mg by mouth daily.    Marland Kitchen  clopidogrel (PLAVIX) 75 MG tablet Take 1 tablet (75 mg total) by mouth daily with breakfast. 30 tablet 1  . furosemide (LASIX) 20 MG tablet Take 1 tablet (20 mg total) by mouth daily as needed. 20 tablet 3  . isosorbide mononitrate (IMDUR) 30 MG 24 hr tablet Take 1 tablet (30 mg total) by mouth daily. 90 tablet 3  . nitroGLYCERIN (NITROSTAT) 0.4 MG SL tablet Place 1 tablet (0.4 mg total) under the tongue every 5 (five) minutes x 3 doses as needed for chest pain. 25 tablet 3  . potassium chloride SA  (K-DUR,KLOR-CON) 20 MEQ tablet Take 1 tablet (20 mEq total) by mouth daily. 90 tablet 2  . pravastatin (PRAVACHOL) 40 MG tablet Take 1 tablet (40 mg total) by mouth daily. 30 tablet 1  . simethicone (MYLICON) 742 MG chewable tablet Chew 125 mg by mouth every 6 (six) hours as needed for flatulence (chew one tablet by mouth daily as needed after meals).     No current facility-administered medications for this visit.    Allergies  Allergen Reactions  . Demerol Itching  . Latex Itching  . Novocain [Procaine Hcl] Swelling    Past Medical History  Diagnosis Date  . Abnormal Pap smear     Colpo (twice)  . Anemia   . Chlamydia     years ago  . Trichomonas     years ago  . Breast disorder     soreness in left breast   . Depression   . CAD (coronary artery disease)     a. 01/2013 NSTEMI/Cath: LM nl, LAD 63m, LCX nl, RI nl, RCA nl, PDA ostial nonocclusive intraluminal thrombus, EF 65%, Treated with heparin x 48 hrs->Med Rx (occurred in setting of + cocaine);  b. 01/2013 Echo: EF 60-65%, nl wall motion.  . Polysubstance abuse     tobacco/cocaine/marijuana (+ UDS 01/2013).  Marland Kitchen Heart disease     MI Jan 2015  . Anxiety and depression 01/01/2014    Past Surgical History  Procedure Laterality Date  . Breast surgery      lump removed from right breast  . Endometrial ablation w/ novasure    . Groin mass open biopsy    . Cervical cone biopsy    . Cesarean section      2 c-sections  . Ectopic pregnancy surgery    . Left heart catheterization with coronary angiogram N/A 02/08/2013    Procedure: LEFT HEART CATHETERIZATION WITH CORONARY ANGIOGRAM;  Surgeon: Sinclair Grooms, MD;  Location: [facility] CATH LAB;  Service: Cardiovascular;  Laterality: N/A;    History  Smoking status  . Former Smoker -- 0.50 packs/day for 31 years  . Types: Cigarettes  . Quit date: 02/05/2013  Smokeless tobacco  . Never Used    Comment: February 05, 2013    History  Alcohol Use  . 0.0 oz/week  . 0 Standard drinks  or equivalent per week    Comment: rarely    Family History  Problem Relation Age of Onset  . Breast cancer Mother   . Hypertension Brother   . Kidney failure Brother   . Hypertension Sister     Review of Systems: The review of systems is per the HPI.  All other systems were reviewed and are negative.  Physical Exam: BP 98/58 mmHg  Pulse 79  Ht 5\' 3"  (1.6 m)  Wt 131 lb (59.421 kg)  BMI 23.21 kg/m2  SpO2 98% Patient is pleasant and in no acute distress. Skin  is warm and dry. Color is normal.  HEENT is unremarkable. Normocephalic/atraumatic. PERRL. Sclera are nonicteric. Neck is supple. No masses. No JVD. Lungs are clear. Cardiac exam shows a regular rate and rhythm. Abdomen is soft. Extremities are with trace lower extremity edema. Gait and ROM are intact. No gross neurologic deficits noted.  Wt Readings from Last 3 Encounters:  03/23/14 131 lb (59.421 kg)  03/10/14 134 lb 1.6 oz (60.827 kg)  02/04/14 135 lb (61.236 kg)    LABORATORY DATA: PENDING  Lab Results  Component Value Date   WBC 6.5 12/17/2013   HGB 13.4 12/17/2013   HCT 40.2 12/17/2013   PLT 263 12/17/2013   GLUCOSE 110* 03/14/2014   CHOL 149 11/05/2013   TRIG 66.0 11/05/2013   HDL 41.80 11/05/2013   LDLCALC 94 11/05/2013   ALT 16 03/10/2014   AST 15 03/10/2014   NA 140 03/14/2014   K 3.4* 03/14/2014   CL 105 03/14/2014   CREATININE 0.99 03/14/2014   BUN 8 03/14/2014   CO2 31 03/14/2014   TSH 0.178* 03/10/2014   INR 1.13 02/06/2013   HGBA1C 5.9 11/05/2013    Exercise nuclear stress test: 11/10/2013 Impression Exercise Capacity: Good exercise capacity. BP Response: Hypotensive blood pressure response. Clinical Symptoms: There is dyspnea. ECG Impression: No significant ST segment change suggestive of ischemia. Comparison with Prior Nuclear Study: No images to compare  Overall Impression: Low risk stress nuclear study with a small sized, mild fixed mid and basal inferior defect that is most  consistent with diaphragmatic attenuation and increased gut uptake. No evidence of ischemia. Of note the patient's BP was 116/70mmHg in stage 3 and decreased to 140/85mmHg in Stage 2. The next BP was in recovery at 173/42mmHg.  LV Ejection Fraction: 63%. LV Wall Motion: NL LV Function; NL Wall Motion    Assessment / Plan:  1. NSTEMI - with nonocclusive intraluminal thrombus - managed with heparin and remains on chronic Plavix - with recurrent chest pain 3-4/week, responding to NTG. Since she haven't got a stent I will schedule an exercise nuclear stress test. Improved with ranolazine 500 mg po BID (free samples given for a trial for 8 weeks).   Repeat stress test in October 2015 performed for chest pain was negative. She currently has stable angina improved with ranolazine that she couldn't afford, Imdur gave her headaches. She refuses amlodipine 2.5 mg and would prefer to take lower dose of imdur 15 mg po at night.   2. Acute pericarditis on 12/17/13,  Resolved with prednisone taper, finished 2 months course of colchicine.   3. LE Swelling - resolved.  4. HLD - labs from last month reviewed with her. Liver enzymes normal in October.  4. Elevated HbA1c - repeat 5.9%  Follow up in 6 months.  Dorothy Spark 03/23/2014

## 2014-04-07 ENCOUNTER — Telehealth: Payer: Self-pay | Admitting: Cardiology

## 2014-04-07 NOTE — Telephone Encounter (Signed)
Patient had chest pain during OV w/ Dr. Meda Coffee in November.  Was sent to ED, Dx pleuritc pain;  Pain that started last night "feels exact same" as it was in November.  It gets worse with breathing and movement.  However, patient states the pain last night went into her arm and so she took a nitro and the pain eased. After talking further she feels maybe it does also mimic her heart attack pain.  Rec patient go to ED for evaluation of this pain.  She verbalizes understanding and agreement.  Paged Wannetta Sender, at [facility] to inform.  She has been made aware.

## 2014-04-07 NOTE — Telephone Encounter (Signed)
New Msg    Pt c/o of Chest Pain: STAT if CP now or developed within 24 hours  1. Are you having CP right now? Yes   2. Are you experiencing any other symptoms (ex. SOB, nausea, vomiting, sweating)? No   3. How long have you been experiencing CP? Yesterday   4. Is your CP continuous or coming and going? Coming and going gets worse  5. Have you taken Nitroglycerin? 1 last night none today.  ?

## 2014-04-18 ENCOUNTER — Other Ambulatory Visit (HOSPITAL_COMMUNITY): Payer: Self-pay | Admitting: *Deleted

## 2014-04-18 ENCOUNTER — Encounter (HOSPITAL_COMMUNITY): Payer: Self-pay | Admitting: *Deleted

## 2014-04-18 DIAGNOSIS — N644 Mastodynia: Secondary | ICD-10-CM

## 2014-04-28 ENCOUNTER — Encounter (HOSPITAL_COMMUNITY): Payer: Self-pay

## 2014-04-28 ENCOUNTER — Ambulatory Visit
Admission: RE | Admit: 2014-04-28 | Discharge: 2014-04-28 | Disposition: A | Discharge status: Home / Self Care | Payer: No Typology Code available for payment source | Source: Ambulatory Visit | Attending: Obstetrics and Gynecology | Admitting: Obstetrics and Gynecology

## 2014-04-28 ENCOUNTER — Other Ambulatory Visit (HOSPITAL_COMMUNITY): Payer: Self-pay | Admitting: *Deleted

## 2014-04-28 ENCOUNTER — Ambulatory Visit (HOSPITAL_COMMUNITY)
Admission: RE | Admit: 2014-04-28 | Discharge: 2014-04-28 | Disposition: A | Discharge status: Home / Self Care | Payer: Self-pay | Source: Ambulatory Visit | Attending: Obstetrics and Gynecology | Admitting: Obstetrics and Gynecology

## 2014-04-28 VITALS — BP 102/60 | Temp 98.1°F | Ht 63.0 in | Wt 130.0 lb

## 2014-04-28 DIAGNOSIS — N644 Mastodynia: Secondary | ICD-10-CM

## 2014-04-28 DIAGNOSIS — Z1239 Encounter for other screening for malignant neoplasm of breast: Secondary | ICD-10-CM

## 2014-04-28 DIAGNOSIS — N6452 Nipple discharge: Secondary | ICD-10-CM

## 2014-04-28 DIAGNOSIS — N6310 Unspecified lump in the right breast, unspecified quadrant: Secondary | ICD-10-CM

## 2014-04-28 NOTE — Progress Notes (Signed)
Complaints of right breast lump x 1 month that is painful and has shooting pains at times. Patient rates pain at a 5-6 out of 10 with at times being a 10 out of 10. Patient noticed white breast nipple discharge a week ago that occurred spontaneously.  Pap Smear:  Pap smear not completed today. Last Pap smear was 10/19/2013 at [facility] and normal. Per patient has a history of an abnormal Pap smear in 2001 that required a colposcopy for follow-up and in 1998 had a LEEP completed for follow-up. Last two Pap smear results is in EPIC.  Physical exam: Breasts Breasts symmetrical. No skin abnormalities bilateral breasts. Bilateral nipple retraction observed that patient states is a change in her right breast. Per previous exams no nipple retraction was observed. No nipple discharge left breast. Yellowish colored nipple discharge observed right breast when expressed. Sample sent to cytology for evaluation.  No lymphadenopathy. No lumps palpated left breast. Palpated a swollen area versus lump under right breast nipple. Complaints of pain when palpated right breast around nipple area. Referred patient to the [place] for right breast diagnostic mammogram and possible ultrasound. Appointment scheduled for Thursday, April 28, 2014 at 1550.  Pelvic/Bimanual No Pap smear completed today since last Pap smear was 10/19/2013. Pap smear not indicated per BCCCP guidelines.

## 2014-04-28 NOTE — Addendum Note (Signed)
Encounter addended by: Armond Hang, LPN on: 4/35/6861  6:83 PM<BR>     Documentation filed: [employer], Orders

## 2014-04-28 NOTE — Patient Instructions (Signed)
Education materials on self-breast awareness given. Explained to  [name] [name] that she did not need a Pap smear today due to last Pap smear was 10/19/2013. Let her know BCCCP will cover Pap smears every 3 years unless has a history of abnormal Pap smears. Referred patient to the [place] for right breast diagnostic mammogram and possible ultrasound. Appointment scheduled for Thursday, April 28, 2014 at 1550. Patient aware of appointment and will be there. Let patient know will follow up with her within the next couple weeks with results of breast discharge by phone. [name] [name] verbalized understanding.  Siddarth Hsiung, Arvil Chaco, RN 3:57 PM

## 2014-05-04 ENCOUNTER — Telehealth: Payer: Self-pay | Admitting: Emergency Medicine

## 2014-05-04 ENCOUNTER — Telehealth: Payer: Self-pay | Admitting: Cardiology

## 2014-05-04 MED ORDER — BUDESONIDE-FORMOTEROL FUMARATE 160-4.5 MCG/ACT IN AERO: 2.0000 | INHALATION_SPRAY | Freq: Two times a day (BID) | RESPIRATORY_TRACT | Status: DC

## 2014-05-04 NOTE — Telephone Encounter (Signed)
New message      Have some questions she want to ask.  Pt would not tell me what the questions were.

## 2014-05-04 NOTE — Telephone Encounter (Signed)
Pt calling to ask if its safe to drive to [place] given her cardiac history.  Informed the pt that its safe to drive or ride to [place] with her hx.  Advised the pt this will be safe as long as she stops every hour and walks around to allow proper blood flow.  Pt verbalized understanding and agrees with this plan.

## 2014-05-04 NOTE — Telephone Encounter (Signed)
Spoke with pt, advised that we do not have symbicort samples in the office at this time.  Offered to send in new rx, pt wanted this sent to [facility] on [address].  This has been sent.  Nothing further needed.

## 2014-05-06 ENCOUNTER — Telehealth (HOSPITAL_COMMUNITY): Payer: Self-pay | Admitting: *Deleted

## 2014-05-06 NOTE — Telephone Encounter (Signed)
Telephoned patient at home # and discussed normal results of breast discharge from pathology. Patient voiced understanding. Patient will call if any further problems.

## 2014-05-09 ENCOUNTER — Ambulatory Visit: Payer: Self-pay | Admitting: Gastroenterology

## 2014-05-09 ENCOUNTER — Telehealth: Payer: Self-pay | Admitting: Emergency Medicine

## 2014-05-09 ENCOUNTER — Telehealth: Payer: Self-pay | Admitting: Gastroenterology

## 2014-05-09 MED ORDER — BUDESONIDE-FORMOTEROL FUMARATE 160-4.5 MCG/ACT IN AERO: 2.0000 | INHALATION_SPRAY | Freq: Two times a day (BID) | RESPIRATORY_TRACT | Status: DC

## 2014-05-09 NOTE — Telephone Encounter (Signed)
No charge. 

## 2014-05-09 NOTE — Telephone Encounter (Signed)
Pt given samples of Symbicort 160 to cover until we can get medication covered.  Pt states that she has a Rx at [facility] [place] but the it has a $305.34 co-pay. Pt given patient assistance forms, advised to fill these out and get back to [place] asap so that we can get it sent off.   Nothing further needed.

## 2014-05-25 ENCOUNTER — Observation Stay (HOSPITAL_COMMUNITY)
Admission: EM | Admit: 2014-05-25 | Discharge: 2014-05-26 | Disposition: A | Discharge status: Home / Self Care | Payer: Self-pay | Attending: Family Medicine | Admitting: Family Medicine

## 2014-05-25 ENCOUNTER — Encounter (HOSPITAL_COMMUNITY): Payer: Self-pay | Admitting: Emergency Medicine

## 2014-05-25 ENCOUNTER — Emergency Department (HOSPITAL_COMMUNITY): Payer: No Typology Code available for payment source

## 2014-05-25 ENCOUNTER — Other Ambulatory Visit (HOSPITAL_COMMUNITY): Payer: Self-pay

## 2014-05-25 DIAGNOSIS — F1410 Cocaine abuse, uncomplicated: Secondary | ICD-10-CM | POA: Insufficient documentation

## 2014-05-25 DIAGNOSIS — I2089 Other forms of angina pectoris: Secondary | ICD-10-CM | POA: Insufficient documentation

## 2014-05-25 DIAGNOSIS — F17210 Nicotine dependence, cigarettes, uncomplicated: Secondary | ICD-10-CM | POA: Insufficient documentation

## 2014-05-25 DIAGNOSIS — Z7982 Long term (current) use of aspirin: Secondary | ICD-10-CM | POA: Insufficient documentation

## 2014-05-25 DIAGNOSIS — I2510 Atherosclerotic heart disease of native coronary artery without angina pectoris: Secondary | ICD-10-CM | POA: Insufficient documentation

## 2014-05-25 DIAGNOSIS — Z7902 Long term (current) use of antithrombotics/antiplatelets: Secondary | ICD-10-CM | POA: Insufficient documentation

## 2014-05-25 DIAGNOSIS — I208 Other forms of angina pectoris: Secondary | ICD-10-CM | POA: Insufficient documentation

## 2014-05-25 DIAGNOSIS — I252 Old myocardial infarction: Secondary | ICD-10-CM | POA: Insufficient documentation

## 2014-05-25 DIAGNOSIS — Z91040 Latex allergy status: Secondary | ICD-10-CM | POA: Insufficient documentation

## 2014-05-25 DIAGNOSIS — R079 Chest pain, unspecified: Principal | ICD-10-CM | POA: Insufficient documentation

## 2014-05-25 DIAGNOSIS — J449 Chronic obstructive pulmonary disease, unspecified: Secondary | ICD-10-CM | POA: Insufficient documentation

## 2014-05-25 DIAGNOSIS — Z9851 Tubal ligation status: Secondary | ICD-10-CM | POA: Insufficient documentation

## 2014-05-25 DIAGNOSIS — F419 Anxiety disorder, unspecified: Secondary | ICD-10-CM | POA: Insufficient documentation

## 2014-05-25 DIAGNOSIS — F329 Major depressive disorder, single episode, unspecified: Secondary | ICD-10-CM | POA: Insufficient documentation

## 2014-05-25 DIAGNOSIS — R42 Dizziness and giddiness: Secondary | ICD-10-CM | POA: Insufficient documentation

## 2014-05-25 DIAGNOSIS — I209 Angina pectoris, unspecified: Secondary | ICD-10-CM

## 2014-05-25 DIAGNOSIS — E039 Hypothyroidism, unspecified: Secondary | ICD-10-CM | POA: Insufficient documentation

## 2014-05-25 DIAGNOSIS — F1099 Alcohol use, unspecified with unspecified alcohol-induced disorder: Secondary | ICD-10-CM | POA: Insufficient documentation

## 2014-05-25 DIAGNOSIS — E785 Hyperlipidemia, unspecified: Secondary | ICD-10-CM | POA: Insufficient documentation

## 2014-05-25 DIAGNOSIS — Z886 Allergy status to analgesic agent status: Secondary | ICD-10-CM | POA: Insufficient documentation

## 2014-05-25 DIAGNOSIS — J45909 Unspecified asthma, uncomplicated: Secondary | ICD-10-CM | POA: Insufficient documentation

## 2014-05-25 DIAGNOSIS — R531 Weakness: Secondary | ICD-10-CM | POA: Insufficient documentation

## 2014-05-25 DIAGNOSIS — I10 Essential (primary) hypertension: Secondary | ICD-10-CM | POA: Insufficient documentation

## 2014-05-25 DIAGNOSIS — I200 Unstable angina: Secondary | ICD-10-CM | POA: Diagnosis present

## 2014-05-25 HISTORY — DX: Acute myocardial infarction, unspecified: I21.9

## 2014-05-25 LAB — CBC
HCT: 39.6 % (ref 36.0–46.0)
Hemoglobin: 12.9 g/dL (ref 12.0–15.0)
MCH: 27.6 pg (ref 26.0–34.0)
MCHC: 32.6 g/dL (ref 30.0–36.0)
MCV: 84.8 fL (ref 78.0–100.0)
Platelets: 254 10*3/uL (ref 150–400)
RBC: 4.67 MIL/uL (ref 3.87–5.11)
RDW: 12.8 % (ref 11.5–15.5)
WBC: 6.7 10*3/uL (ref 4.0–10.5)

## 2014-05-25 LAB — BASIC METABOLIC PANEL
ANION GAP: 9 (ref 5–15)
BUN: 11 mg/dL (ref 6–23)
CALCIUM: 9 mg/dL (ref 8.4–10.5)
CO2: 21 mmol/L (ref 19–32)
CREATININE: 0.95 mg/dL (ref 0.50–1.10)
Chloride: 108 mmol/L (ref 96–112)
GFR calc Af Amer: 81 mL/min — ABNORMAL LOW (ref >90)
GFR, EST NON AFRICAN AMERICAN: 70 mL/min — AB (ref >90)
Glucose, Bld: 98 mg/dL (ref 70–99)
Potassium: 3.4 mmol/L — ABNORMAL LOW (ref 3.5–5.1)
SODIUM: 138 mmol/L (ref 135–145)

## 2014-05-25 LAB — I-STAT TROPONIN, ED
Troponin i, poc: 0 ng/mL (ref 0.00–0.08)
Troponin i, poc: 0.01 ng/mL (ref 0.00–0.08)

## 2014-05-25 LAB — BRAIN NATRIURETIC PEPTIDE: B NATRIURETIC PEPTIDE 5: 27.8 pg/mL (ref 0.0–100.0)

## 2014-05-25 MED ORDER — ASPIRIN 81 MG PO CHEW
324.0000 mg | CHEWABLE_TABLET | Freq: Once | ORAL | Status: AC
  Administered 2014-05-25: 324 mg via ORAL
  Filled 2014-05-25: qty 4

## 2014-05-25 NOTE — ED Notes (Signed)
C/o intermittent L sided chest pressure that radiates to L arm x 2 1/2 hours.  Also reports sob, nausea, diaphoresis, and dizziness.

## 2014-05-25 NOTE — ED Notes (Signed)
MD at bedside. 

## 2014-05-25 NOTE — ED Notes (Signed)
Pt is aware that urine is needed for testing, pt states she is unable to urinate at this time.

## 2014-05-25 NOTE — ED Provider Notes (Signed)
CSN: [id number]     Arrival date & time 05/25/14  1730 History   First MD Initiated Contact with Patient 05/25/14 2037     Chief Complaint  Patient presents with  . Chest Pain    (Consider location/radiation/quality/duration/timing/severity/associated sxs/prior Treatment) Patient is a 49 y.o. female presenting with chest pain. The history is provided by the patient. No language interpreter was used.  Chest Pain Pain location:  L chest Pain quality: pressure   Pain radiates to:  L arm and L shoulder Pain radiates to the back: no   Pain severity:  Moderate Onset quality:  Sudden Timing:  Constant Progression:  Resolved Chronicity:  Recurrent Context: at rest   Relieved by:  Nitroglycerin Worsened by:  Nothing tried Ineffective treatments:  None tried Associated symptoms: diaphoresis and nausea   Associated symptoms: no abdominal pain, no altered mental status, no anorexia, no anxiety, no back pain, no cough, no fatigue, no fever, no headache, not vomiting and no weakness   Risk factors: coronary artery disease, hypertension and smoking   Risk factors: no prior DVT/PE     Past Medical History  Diagnosis Date  . Abnormal Pap smear     Colpo (twice)  . Anemia   . Chlamydia     years ago  . Trichomonas     years ago  . Breast disorder     soreness in left breast   . Depression   . CAD (coronary artery disease)     a. 01/2013 NSTEMI/Cath: LM nl, LAD 74m, LCX nl, RI nl, RCA nl, PDA ostial nonocclusive intraluminal thrombus, EF 65%, Treated with heparin x 48 hrs->Med Rx (occurred in setting of + cocaine);  b. 01/2013 Echo: EF 60-65%, nl wall motion.  . Polysubstance abuse     tobacco/cocaine/marijuana (+ UDS 01/2013).  Marland Kitchen Heart disease     MI Jan 2015  . Anxiety and depression 01/01/2014  . Myocardial infarction    Past Surgical History  Procedure Laterality Date  . Breast surgery      lump removed from right breast  . Endometrial ablation w/ novasure    . Groin mass open  biopsy    . Cervical cone biopsy    . Cesarean section      2 c-sections  . Ectopic pregnancy surgery    . Left heart catheterization with coronary angiogram N/A 02/08/2013    Procedure: LEFT HEART CATHETERIZATION WITH CORONARY ANGIOGRAM;  Surgeon: Sinclair Grooms, MD;  Location: [facility] CATH LAB;  Service: Cardiovascular;  Laterality: N/A;  . Tubal ligation     Family History  Problem Relation Age of Onset  . Breast cancer Mother   . Hypertension Brother   . Kidney failure Brother   . Hypertension Sister    History  Substance Use Topics  . Smoking status: Current Every Day Smoker -- 0.50 packs/day for 31 years    Types: Cigarettes  . Smokeless tobacco: Never Used  . Alcohol Use: 0.0 oz/week    0 Standard drinks or equivalent per week     Comment: rarely   OB History    Gravida Para Term Preterm AB TAB SAB Ectopic Multiple Living   4 2 2  2  1 1  2      Review of Systems  Constitutional: Positive for diaphoresis. Negative for fever and fatigue.  Respiratory: Negative for cough and chest tightness.   Cardiovascular: Positive for chest pain.  Gastrointestinal: Positive for nausea. Negative for vomiting, abdominal pain and anorexia.  Musculoskeletal: Negative for back pain.  Neurological: Negative for weakness, light-headedness and headaches.  Psychiatric/Behavioral: Negative for confusion.  All other systems reviewed and are negative.     Allergies  Demerol; Latex; and Novocain  Home Medications   Prior to Admission medications   Medication Sig Start Date End Date Taking? Authorizing Provider  acetaminophen (TYLENOL) 500 MG tablet Take 1,000 mg by mouth every 6 (six) hours as needed for mild pain.    Historical Provider, MD  albuterol (PROAIR HFA) 108 (90 BASE) MCG/ACT inhaler Inhale 2 puffs into the lungs every 4 (four) hours as needed for wheezing or shortness of breath. 06/03/13   Collene Gobble, MD  aspirin EC 81 MG EC tablet Take 1 tablet (81 mg total) by mouth daily.  02/12/13   Rogelia Mire, NP  budesonide-formoterol [facility]) 160-4.5 MCG/ACT inhaler Inhale 2 puffs into the lungs 2 (two) times daily. 05/09/14   Collene Gobble, MD  carvedilol (COREG) 3.125 MG tablet Take 1 tablet (3.125 mg total) by mouth 2 (two) times daily with a meal. 10/18/13   Dorothy Spark, MD  cetirizine (ZYRTEC) 10 MG tablet Take 10 mg by mouth daily.    Historical Provider, MD  clopidogrel (PLAVIX) 75 MG tablet Take 1 tablet (75 mg total) by mouth daily with breakfast. 10/18/13   Dorothy Spark, MD  furosemide (LASIX) 20 MG tablet Take 1 tablet (20 mg total) by mouth daily as needed. 03/30/13   Burtis Junes, NP  isosorbide mononitrate (IMDUR) 30 MG 24 hr tablet Take 0.5 tablets (15 mg total) by mouth daily. 03/23/14   Dorothy Spark, MD  nitroGLYCERIN (NITROSTAT) 0.4 MG SL tablet Place 1 tablet (0.4 mg total) under the tongue every 5 (five) minutes x 3 doses as needed for chest pain. 12/02/13   Dorothy Spark, MD  potassium chloride SA (K-DUR,KLOR-CON) 20 MEQ tablet Take 1 tablet (20 mEq total) by mouth daily. 03/15/14   Dorothy Spark, MD  pravastatin (PRAVACHOL) 40 MG tablet Take 1 tablet (40 mg total) by mouth daily. 10/18/13   Dorothy Spark, MD  simethicone (MYLICON) 147 MG chewable tablet Chew 125 mg by mouth every 6 (six) hours as needed for flatulence (chew one tablet by mouth daily as needed after meals).    Historical Provider, MD   BP 120/79 mmHg  Pulse 70  Temp(Src) 98 F (36.7 C)  Resp 18  SpO2 100%  LMP 01/31/2014 Physical Exam  Constitutional: Vital signs are normal. She appears well-developed and well-nourished. She does not appear ill. No distress.  HENT:  Head: Normocephalic and atraumatic.  Nose: Nose normal.  Mouth/Throat: Oropharynx is clear and moist. No oropharyngeal exudate.  Eyes: EOM are normal. Pupils are equal, round, and reactive to light.  Neck: Normal range of motion. Neck supple.  Cardiovascular: Normal rate, regular rhythm,  normal heart sounds and intact distal pulses.   No murmur heard. Pulmonary/Chest: Effort normal and breath sounds normal. No respiratory distress. She has no wheezes. She exhibits no tenderness.  No reproducible chest or shoulder pain.  Full LUE ROM.    Abdominal: Soft. There is no tenderness. There is no rebound and no guarding.  Musculoskeletal: Normal range of motion. She exhibits no tenderness.  Lymphadenopathy:    She has no cervical adenopathy.  Neurological: She is alert. No cranial nerve deficit. Coordination normal.  Skin: Skin is warm and dry. She is not diaphoretic.  Psychiatric: She has a normal mood and affect. Her  behavior is normal. Judgment and thought content normal.  Nursing note and vitals reviewed.   ED Course  Procedures (including critical care time) Labs Review Labs Reviewed  BASIC METABOLIC PANEL - Abnormal; Notable for the following:    Potassium 3.4 (*)    GFR calc non Af Amer 70 (*)    GFR calc Af Amer 81 (*)    All other components within normal limits  CBC  BRAIN NATRIURETIC PEPTIDE  I-STAT TROPOININ, ED    Imaging Review Dg Chest 2 View  05/25/2014   CLINICAL DATA:  Chest pain today.  EXAM: CHEST  2 VIEW  COMPARISON:  12/17/2013  FINDINGS: The heart size and mediastinal contours are within normal limits. Both lungs are clear. The visualized skeletal structures are unremarkable.  IMPRESSION: Normal exam.   Electronically Signed   By: Lorriane Shire M.D.   On: 05/25/2014 18:52     EKG Interpretation None      MDM   Final diagnoses:  None   Pt is a 49 yo F with hx of HTN, CAD, polysubstance abuse, HLD, tobacco abuse, and hypothyroidism who presents with chest pain.   5 days of intermittent chest pain.  Left sided, radiates to left shoulder and arm.  Similar to her previous chest pain episodes.  Takes IMDUR daily but has had to supplement with SL NTG tablets several times in the past few weeks.  Last took NTG yesterday.  Has had associated  lightheadedness with the chest pain.  Today was in her car that doesn't have AC and felt overheated, then had some nausea and diaphoresis.  Had mild left shoulder pain during that episode, but she believes her other sx are due to the warm external temp.   Has a hx of NSTEMI in Jan 2015 that was associated with cocaine use.  Had a LHC on 02/08/13 that showed non occlusive disease but there was an intraluminal thrombus noted in the PDA.  She was started on medical management with ASA 81 and plavix.   Had a nuclear stress test in October that showed no ischemic EKG changes.   Returned to cardiology clinic in November with recurrent chest pain and had new T wave inversions in the anterior leads, but had 2 x negative trops and was discharged home.  Denies any cocaine in the past several months.   Story is concerning for ACS.  Given ASA 324 mg.    EKG with NSR and no signs of ischemia.  Trops negative x 2   Sees Dr. Ena Dawley for cardiology and Dr. Clearance Coots with family medicine for PCP.    Spoke with cardiology at 2315 who recommended admission to medicine for increasing episodes of angina.  Pt's PCP if a family medicine resident, so will admit to Dr. Cindra Presume family medicine resident team.    Patient was seen with ED Attending, Dr. Gwynneth Aliment, MD   Tori Milks, MD 05/26/14 [place], MD 05/26/14 [id number]

## 2014-05-25 NOTE — ED Notes (Signed)
Dr. Wofford at bedside 

## 2014-05-25 NOTE — ED Notes (Signed)
Pt states that she does not need to urinate. Aware that a urine sample is needed.

## 2014-05-25 NOTE — Discharge Instructions (Signed)
Angina Pectoris  Angina pectoris, often just called angina, is extreme discomfort in your chest, neck, or arm caused by a lack of blood in the middle and thickest layer of your heart wall (myocardium). It may feel like tightness or heavy pressure. It may feel like a crushing or squeezing pain. Some people say it feels like gas or indigestion. It may go down your shoulders, back, and arms. Some people may have symptoms other than pain. These symptoms include fatigue, shortness of breath, cold sweats, or nausea. There are four different types of angina:  · Stable angina--Stable angina usually occurs in episodes of predictable frequency and duration. It usually is brought on by physical activity, emotional stress, or excitement. These are all times when the myocardium needs more oxygen. Stable angina usually lasts a few minutes and often is relieved by taking a medicine that can be taken under your tongue (sublingually). The medicine is called nitroglycerin. Stable angina is caused by a buildup of plaque inside the arteries, which restricts blood flow to the heart muscle (atherosclerosis).  · Unstable angina--Unstable angina can occur even when your body experiences little or no physical exertion. It can occur during sleep. It can also occur at rest. It can suddenly increase in severity or frequency. It might not be relieved by sublingual nitroglycerin. It can last up to 30 minutes. The most common cause of unstable angina is a blood clot that has developed on the top of plaque buildup inside a coronary artery. It can lead to a heart attack if the blood clot completely blocks the artery.  · Microvascular angina--This type of angina is caused by a disorder of tiny blood vessels called arterioles. Microvascular angina is more common in women. The pain may be more severe and last longer than other types of angina pectoris.  · Prinzmetal or variant angina--This type of angina pectoris usually occurs when your body  experiences little or no physical exertion. It especially occurs in the early morning hours. It is caused by a spasm of your coronary artery.  HOME CARE INSTRUCTIONS   · Only take over-the-counter and prescription medicines as directed by your health care provider.  · Stay active or increase your exercise as directed by your health care provider.  · Limit strenuous activity as directed by your health care provider.  · Limit heavy lifting as directed by your health care provider.  · Maintain a healthy weight.  · Learn about and eat heart-healthy foods.  · Do not use any tobacco products including cigarettes, chewing tobacco or electronic cigarettes.  SEEK IMMEDIATE MEDICAL CARE IF:   You experience the following symptoms:  · Chest, neck, deep shoulder, or arm pain or discomfort that lasts more than a few minutes.  · Chest, neck, deep shoulder, or arm pain or discomfort that goes away and comes back, repeatedly.  · Heavy sweating with discomfort, without a noticeable cause.  · Shortness of breath or difficulty breathing.  · Angina that does not get better after a few minutes of rest or after taking sublingual nitroglycerin.  These can all be symptoms of a heart attack, which is a medical emergency! Get medical help at once. Call your local emergency service (911 in [place] immediately. Do not  drive yourself to the hospital and do not  wait to for your symptoms to go away.  MAKE SURE YOU:  · Understand these instructions.  · Will watch your condition.  · Will get help right away if you are not   doing well or get worse.  Document Released: 01/14/2005 Document Revised: 01/19/2013 Document Reviewed: 05/18/2013  Exit[employer]® Patient Information ©2015 [employer]. This information is not intended to replace advice given to you by your health care provider. Make sure you discuss any questions you have with your health care provider.

## 2014-05-26 ENCOUNTER — Encounter (HOSPITAL_COMMUNITY): Payer: Self-pay | Admitting: Urology

## 2014-05-26 DIAGNOSIS — I208 Other forms of angina pectoris: Secondary | ICD-10-CM | POA: Insufficient documentation

## 2014-05-26 DIAGNOSIS — I10 Essential (primary) hypertension: Secondary | ICD-10-CM

## 2014-05-26 DIAGNOSIS — R079 Chest pain, unspecified: Secondary | ICD-10-CM | POA: Insufficient documentation

## 2014-05-26 DIAGNOSIS — J449 Chronic obstructive pulmonary disease, unspecified: Secondary | ICD-10-CM

## 2014-05-26 DIAGNOSIS — I2089 Other forms of angina pectoris: Secondary | ICD-10-CM | POA: Insufficient documentation

## 2014-05-26 DIAGNOSIS — I200 Unstable angina: Secondary | ICD-10-CM | POA: Diagnosis present

## 2014-05-26 DIAGNOSIS — I209 Angina pectoris, unspecified: Secondary | ICD-10-CM

## 2014-05-26 DIAGNOSIS — J45909 Unspecified asthma, uncomplicated: Secondary | ICD-10-CM

## 2014-05-26 LAB — TSH: TSH: 0.226 u[IU]/mL — ABNORMAL LOW (ref 0.350–4.500)

## 2014-05-26 LAB — TROPONIN I: Troponin I: <0.03 ng/mL (ref <0.031)

## 2014-05-26 MED ORDER — HEPARIN SODIUM (PORCINE) 5000 UNIT/ML IJ SOLN
5000.0000 [IU] | Freq: Three times a day (TID) | INTRAMUSCULAR | Status: DC
  Filled 2014-05-26 (×3): qty 1

## 2014-05-26 MED ORDER — SODIUM CHLORIDE 0.9 % IJ SOLN: 3.0000 mL | INTRAMUSCULAR | Status: DC | PRN

## 2014-05-26 MED ORDER — SODIUM CHLORIDE 0.9 % IV SOLN: 250.0000 mL | INTRAVENOUS | Status: DC | PRN

## 2014-05-26 MED ORDER — POTASSIUM CHLORIDE CRYS ER 20 MEQ PO TBCR
20.0000 meq | EXTENDED_RELEASE_TABLET | Freq: Every day | ORAL | Status: DC
  Administered 2014-05-26: 20 meq via ORAL
  Filled 2014-05-26: qty 1

## 2014-05-26 MED ORDER — BUDESONIDE-FORMOTEROL FUMARATE 160-4.5 MCG/ACT IN AERO
2.0000 | INHALATION_SPRAY | Freq: Two times a day (BID) | RESPIRATORY_TRACT | Status: DC
  Administered 2014-05-26: 2 via RESPIRATORY_TRACT
  Filled 2014-05-26: qty 6

## 2014-05-26 MED ORDER — ALBUTEROL SULFATE (2.5 MG/3ML) 0.083% IN NEBU: 2.5000 mg | INHALATION_SOLUTION | RESPIRATORY_TRACT | Status: DC | PRN

## 2014-05-26 MED ORDER — PRAVASTATIN SODIUM 40 MG PO TABS
40.0000 mg | ORAL_TABLET | Freq: Every day | ORAL | Status: DC
  Filled 2014-05-26: qty 1

## 2014-05-26 MED ORDER — ASPIRIN EC 81 MG PO TBEC
81.0000 mg | DELAYED_RELEASE_TABLET | Freq: Every day | ORAL | Status: DC
  Administered 2014-05-26: 81 mg via ORAL
  Filled 2014-05-26: qty 1

## 2014-05-26 MED ORDER — SODIUM CHLORIDE 0.9 % IJ SOLN
3.0000 mL | Freq: Two times a day (BID) | INTRAMUSCULAR | Status: DC
  Administered 2014-05-26: 3 mL via INTRAVENOUS

## 2014-05-26 MED ORDER — ISOSORBIDE MONONITRATE 15 MG HALF TABLET
15.0000 mg | ORAL_TABLET | Freq: Every day | ORAL | Status: DC
  Filled 2014-05-26: qty 1

## 2014-05-26 MED ORDER — GUAIFENESIN ER 600 MG PO TB12
600.0000 mg | ORAL_TABLET | Freq: Two times a day (BID) | ORAL | Status: DC | PRN
  Administered 2014-05-26: 600 mg via ORAL
  Filled 2014-05-26 (×2): qty 1

## 2014-05-26 MED ORDER — LORATADINE 10 MG PO TABS
10.0000 mg | ORAL_TABLET | Freq: Every day | ORAL | Status: DC
  Administered 2014-05-26: 10 mg via ORAL
  Filled 2014-05-26: qty 1

## 2014-05-26 MED ORDER — ACETAMINOPHEN 500 MG PO TABS: 1000.0000 mg | ORAL_TABLET | Freq: Four times a day (QID) | ORAL | Status: DC | PRN

## 2014-05-26 MED ORDER — CLOPIDOGREL BISULFATE 75 MG PO TABS
75.0000 mg | ORAL_TABLET | Freq: Every day | ORAL | Status: DC
  Administered 2014-05-26: 75 mg via ORAL
  Filled 2014-05-26 (×2): qty 1

## 2014-05-26 MED ORDER — CARVEDILOL 3.125 MG PO TABS
3.1250 mg | ORAL_TABLET | Freq: Two times a day (BID) | ORAL | Status: DC
  Administered 2014-05-26: 3.125 mg via ORAL
  Filled 2014-05-26 (×3): qty 1

## 2014-05-26 NOTE — Progress Notes (Signed)
Family Medicine Teaching Service Daily Progress Note Intern Pager: [phone]  Patient name: [name] [name] Medical record number: [id number] Date of birth: 01-02-1966 Age: 49 y.o. Gender: female  Primary Care Provider: Rosemarie Ax, MD Consultants: Cardiology Code Status: Full  Pt Overview and Major Events to Date:  4/28 - Admitted with chest pain  Assessment and Plan: [name] [name] is a 49 y.o. female presenting with dizziness and weakness with chest pain. PMH is significant for CAD (NSTEMI with VF in the setting of cocaine abuse), Asthma, and COPD.  Chest pain / ACS rule out. Heart score of 4. Had NSTEMI (in setting of cocaine abuse) and catheterization in 01/2013 that showed non-occlusive disease. Chest pain currently resolved, though presentation concerning for increasing frequency of anginal episodes. - Troponin negative x3 - Cardiology involved, appreciate assistance - OK for discharge home - Continue ASA, plavix, coreg 3.125mg  bid, imdur, and pravastatin - Consider obtaining orthostatics (though patient's symptoms are not positional) - Consider cardiac event monitor to evaluate for underlying arrhythmia if dizziness symptoms persist  Asthma/COPD. Currently asymptomatic.  - Continue home symbicort - Albuterol prn  FEN/GI: Heart Healthy diet, SLIV Prophylaxis: Subcutaneous heparin  Disposition: Admitted pending above work-up and management. Anticipate discharge this afternoon.   Subjective:  Doing well this morning. Chest pain resolved. No complaints.  Objective: Temp:  [98 F (36.7 C)] 98 F (36.7 C) (04/28 0508) Pulse Rate:  [70-87] 74 (04/28 0508) Resp:  [12-21] 17 (04/28 0508) BP: (106-145)/(67-94) 106/67 mmHg (04/28 0508) SpO2:  [98 %-100 %] 99 % (04/28 0508) Weight:  [126 lb 14.4 oz (57.561 kg)] 126 lb 14.4 oz (57.561 kg) (04/28 0036) Physical Exam: General: 49yo female lying in hospital bed in NAD HEENT: NCAT, EOMI, MMM Cardiovascular: RRR, no murmurs  appreciated Respiratory: NWOB, CTAB with no wheezes or crackles Abdomen: S, NT, ND Extremities: WWP, no edema Skin: No rashes Neuro: Alert and conversational, no focal neurological deficits.  Laboratory:  Recent Labs Lab 05/25/14 1744  WBC 6.7  HGB 12.9  HCT 39.6  PLT 254    Recent Labs Lab 05/25/14 1744  NA 138  K 3.4*  CL 108  CO2 21  BUN 11  CREATININE 0.95  CALCIUM 9.0  GLUCOSE 98   Troponin negative x3 TSH 0.226  EKG: NSR, no ischemic changes  Vivi Barrack, MD 05/26/2014, 7:40 AM PGY-1, [facility] Intern pager: [phone], text pages welcome

## 2014-05-26 NOTE — Progress Notes (Signed)
Pt arrived to room in NAD, states she doesn't know why she is admitted, no complaints of pain. Pt oriented to room and floor, VSS. Will continue to monitor. Ronnette Hila, RN

## 2014-05-26 NOTE — ED Notes (Signed)
Denies CP at this time

## 2014-05-26 NOTE — Consult Note (Signed)
Cardiology Consultation Note  Patient ID: [name] [name], MRN: [id number], DOB/AGE: March 12, 1965 49 y.o. Admit date: 05/25/2014   Date of Consult: 05/26/2014 Primary Physician: [name] Ax, MD Primary Cardiologist: [name] Dawley, MD  Chief Complaint: chest pain, weakness, and dizziness Reason for Consultation: chest pain  HPI: 49 year old woman with history of NSTEMI in 01/2013 and the setting of cocaine use complicated by VF with coronary angiography demonstrating nonocclusive thrombus in the ostium of the PDA that was medically managed, acute pericarditis and 12/2013, depression, and anxiety, whom we have been asked to see due to recurrent chest pain with new weakness and dizziness.  The patient reports having intermittent chest pain ever since her MI in 01/2013.  She notes having episodes of central chest pressure every 2-3 days.  At times, she will take a sublingual nitroglycerin with prompt resolution of her pain.  She is continued to have these episodes over the last couple of weeks, though per her report, they have not changed in frequency or intensity.  However, she noted generalized weakness and lightheadedness/dizziness, which she attributes to being "overheated."  She endorses using marijuana a few days a week but denies other illicit drug use, including cocaine.  Since arriving in the emergency department, her symptoms have resolved spontaneously.  She denies palpitation, edema, orthopnea, or PND.  She has also not had any significant bleeding, remaining on dual antiplatelet therapy with aspirin and clopidogrel.  She notes intermittent lower extremity swelling predominantly involving her feet and ankles for which she takes as needed Lasix about once every 3 weeks.  Initial point-of-care troponins in the ER have been negative 2.  Past Medical History  Diagnosis Date  . Abnormal Pap smear     Colpo (twice)  . Anemia   . Chlamydia     years ago  . Trichomonas     years ago  . Breast  disorder     soreness in left breast   . Depression   . CAD (coronary artery disease)     a. 01/2013 NSTEMI/Cath: LM nl, LAD 69m, LCX nl, RI nl, RCA nl, PDA ostial nonocclusive intraluminal thrombus, EF 65%, Treated with heparin x 48 hrs->Med Rx (occurred in setting of + cocaine);  b. 01/2013 Echo: EF 60-65%, nl wall motion.  . Polysubstance abuse     tobacco/cocaine/marijuana (+ UDS 01/2013).  [facility] Kitchen Heart disease     MI Jan 2015  . Anxiety and depression 01/01/2014  . Myocardial infarction       Most Recent Cardiac Studies: Echocardiogram (02/06/13): Normal LV size and function (EF 60-65%) .  Otherwise, unremarkable echocardiogram.    Left heart catheterization (02/08/13): Nonocclusive thrombus in the ostium of the PDA .  Otherwise, nonobstructive CAD with mild luminal irregularities involving the LAD.     Surgical History:  Past Surgical History  Procedure Laterality Date  . Breast surgery      lump removed from right breast  . Endometrial ablation w/ novasure    . Groin mass open biopsy    . Cervical cone biopsy    . Cesarean section      2 c-sections  . Ectopic pregnancy surgery    . Left heart catheterization with coronary angiogram N/A 02/08/2013    Procedure: LEFT HEART CATHETERIZATION WITH CORONARY ANGIOGRAM;  Surgeon: [name] Grooms, MD;  Location: [facility] C[facility] LAB;  Service: Cardiovascular;  Laterality: N/A;  . Tubal ligation       Home Meds: Prior to Admission medications   Medication  Sig Start Date [name] [name] Date Taking? Authorizing Provider  acetaminophen (TYLENOL) 500 MG tablet Take 1,000 mg by mouth every 6 (six) hours as needed for mild pain.   Yes Historical Provider, MD  albuterol (PROAIR HFA) 108 (90 BASE) MCG/ACT inhaler Inhale 2 puffs into the lungs every 4 (four) hours as needed for wheezing or shortness of breath. 06/03/13  Yes [name] Gobble, MD  aspirin EC 81 MG EC tablet Take 1 tablet (81 mg total) by mouth daily. 02/12/13  Yes [name] Mire, NP    budesonide-formoterol [facility]) 160-4.5 MCG/ACT inhaler Inhale 2 puffs into the lungs 2 (two) times daily. 05/09/14  Yes [name] Gobble, MD  carvedilol (COREG) 3.125 MG tablet Take 1 tablet (3.125 mg total) by mouth 2 (two) times daily with a meal. 10/18/13  Yes [name] Spark, MD  cetirizine (ZYRTEC) 10 MG tablet Take 10 mg by mouth daily.   Yes Historical Provider, MD  clopidogrel (PLAVIX) 75 MG tablet Take 1 tablet (75 mg total) by mouth daily with breakfast. 10/18/13  Yes [name] Spark, MD  furosemide (LASIX) 20 MG tablet Take 1 tablet (20 mg total) by mouth daily as needed. 03/30/13  Yes [name] Junes, NP  ibuprofen (ADVIL,MOTRIN) 200 MG tablet Take 200 mg by mouth every 6 (six) hours as needed for mild pain.   Yes Historical Provider, MD  isosorbide mononitrate (IMDUR) 30 MG 24 hr tablet Take 0.5 tablets (15 mg total) by mouth daily. 03/23/14  Yes [name] Spark, MD  nitroGLYCERIN (NITROSTAT) 0.4 MG SL tablet Place 1 tablet (0.4 mg total) under the tongue every 5 (five) minutes x 3 doses as needed for chest pain. 12/02/13  Yes [name] Spark, MD  potassium chloride SA (K-DUR,KLOR-CON) 20 MEQ tablet Take 1 tablet (20 mEq total) by mouth daily. 03/15/14  Yes [name] Spark, MD  pravastatin (PRAVACHOL) 40 MG tablet Take 1 tablet (40 mg total) by mouth daily. 10/18/13  Yes [name] Spark, MD    Inpatient Medications:  . aspirin EC  81 mg Oral Daily  . budesonide-formoterol  2 puff Inhalation BID  . carvedilol  3.125 mg Oral BID WC  . clopidogrel  75 mg Oral Q breakfast  . heparin  5,000 Units Subcutaneous 3 times per day  . isosorbide mononitrate  15 mg Oral Daily  . loratadine  10 mg Oral Daily  . potassium chloride SA  20 mEq Oral Daily  . pravastatin  40 mg Oral Daily  . sodium chloride  3 mL Intravenous Q12H  . sodium chloride  3 mL Intravenous Q12H      Allergies:  Allergies  Allergen Reactions  . Demerol Itching  . Latex Itching  . Novocain [Procaine Hcl]  Swelling    History   Social History  . Marital Status: Single    Spouse Name: N/A  . Number of Children: N/A  . Years of Education: N/A   Occupational History  . Not on file.   Social History Main Topics  . Smoking status: Former Smoker -- 0.50 packs/day for 31 years    Types: Cigarettes    Quit date: 02/05/2013  . Smokeless tobacco: Never Used  . Alcohol Use: 0.0 oz/week    0 Standard drinks or equivalent per week     Comment: rarely  . Drug Use: 2.00 per week    Special: Marijuana     Comment: Daily Use  . Sexual Activity: Yes    Birth Control/ Protection: None,  Surgical   Other Topics Concern  . Not on file   Social History Narrative   Lives in [place]    Works in child care.      Family History  Problem Relation Age of Onset  . Breast cancer Mother   . Hypertension Brother   . Kidney failure Brother   . Hypertension Sister      Review of Systems: A 12 system review of systems was performed and was negative except as noted in the history of present illness.  Labs:  Recent Labs  05/26/14 0235  TROPONINI <0.03   Lab Results  Component Value Date   WBC 6.7 05/25/2014   HGB 12.9 05/25/2014   HCT 39.6 05/25/2014   MCV 84.8 05/25/2014   PLT 254 05/25/2014     Recent Labs Lab 05/25/14 1744  NA 138  K 3.4*  CL 108  CO2 21  BUN 11  CREATININE 0.95  CALCIUM 9.0  GLUCOSE 98   Lab Results  Component Value Date   CHOL 149 11/05/2013   HDL 41.80 11/05/2013   LDLCALC 94 11/05/2013   TRIG 66.0 11/05/2013   No results found for: DDIMER  Radiology/Studies:  CXR: Normal exam.  Wt Readings from Last 3 Encounters:  05/26/14 57.561 kg (126 lb 14.4 oz)  03/23/14 59.421 kg (131 lb)  03/10/14 60.827 kg (134 lb 1.6 oz)    EKG: Normal sinus rhythm without significant abnormalities.  Unchanged since 12/17/13.  Physical Exam: Blood pressure 106/67, pulse 74, temperature 98 F (36.7 C), temperature source Oral, resp. rate 17, height 5\' 3"  (1.6  m), weight 57.561 kg (126 lb 14.4 oz), last menstrual period 01/31/2014, SpO2 99 %. General: Well developed, well nourished, in no acute distress. Head: Normocephalic, atraumatic, sclera non-icteric, no xanthomas, nares are without discharge.  Neck: Negative for carotid bruits. JVD not elevated. Lungs: Clear bilaterally to auscultation without wheezes, rales, or rhonchi. Breathing is unlabored. Heart: RRR with S1 S2. No murmurs, rubs, or gallops appreciated. Abdomen: Soft, non-tender, non-distended with normoactive bowel sounds. No hepatomegaly. No rebound/guarding. No obvious abdominal masses. Msk:  Strength and tone appear normal for age. Extremities: No clubbing or cyanosis. No edema.  Distal pedal pulses are 2+ and equal bilaterally. Neuro: Alert and oriented X 3. No facial asymmetry. No focal deficit. Moves all extremities spontaneously. Psych:  Responds to questions appropriately with a normal affect.    Assessment and Plan: 49 year old woman with history of coronary artery disease with non-ST segment elevation MI in 01/2013 with nonobstructive thrombus in the ostium of the PDA and the setting of cocaine use, who presents with a one-day history of weakness/dizziness superimposed on chronic chest pain consistent with stable angina.  Chest pain: The patient states that her symptoms are unchanged from her baseline.  Her pain is likely multifactorial but is not consistent with an acute coronary syndrome.  Her cardiac biomarkers have been negative 3.  We do not feel that further workup is necessary at this time. - Continue home medications, including ASA, clopidogrel, isosorbide mononitrate, carvediolol, and pravastatin. - Follow-up with Dr. Meda Coffee, as previously arranged.  Dizziness/weakness:  Symptoms are vague and not consistent with a clear cardiogenic etiology.  Given patient's concern that it may have been related to heat, it is possible that she is somewhat intravascularly depleted; her  symptoms could be exacerbated by concurrent beta blocker and isosorbide mononitrate use. - Consider checking orthostatic vital signs - Encourage adequate oral hydration, particularly when outside in the heat for  extended periods - If symptoms persist despite adequate hydration, could consider cardiac event monitor to evaluate for underlying arrhythmia as a cause of her symptoms.  Signed, Kloie Whiting A. MD 05/26/2014, 6:39 AM Pager: [phone]

## 2014-05-26 NOTE — H&P (Signed)
[facility] Admission History and Physical Service Pager: [phone]  Patient name: [name] [name] Medical record number: [id number] Date of birth: [birth year] Age: 49 y.o. Gender: female  Primary Care Provider: Rosemarie Ax, MD Consultants: Cardiology Code Status: Full  Chief Complaint: Chest pain  Assessment and Plan: [name] [name] is a 49 y.o. female presenting with dizziness and weakness with chest pain. PMH is significant for CAD (NSTEMI with VF in the setting of cocaine abuse), Asthma, and COPD.  Chest pain / ACS rule out. Heart score of 4. Had NSTEMI (in setting of cocaine abuse) and catheterization in 01/2013 that showed non-occlusive disease. Chest pain currently resolved, though presentation concerning for increasing frequency of anginal episodes.Troponin negative x2 and EKG negative in ED. Current episode does not sound anginal. - Admitted to telemetry under attending Dr Mingo Amber - Cardiology consulted in ED, will see in the AM - Trend troponin until negative x3 - EKG in AM - Risk stratification labs (TSH, A1c) - Continue ASA, plavix, coreg 3.125mg  bid, imdur, and pravastatin  Asthma/COPD. Currently asymptomatic.  - Continue home symbicort - Albuterol prn  Hx cocaine abuse: has not given urine sample here. Some behaviors observed by nursing staff that suggest pt does not want to give UDS sample ("couldn't pee" in ER then immediately urinated in bathroom after arriving on floor and informed floor nurse she had already given sample). Pt presently denies cocaine use. - Discuss with pt the risk of being on coreg if she is using cocaine  FEN/GI: Heart Healthy diet, SLIV Prophylaxis: Subcutaneous heparin  Disposition: Admitted pending above work-up and management.  History of Present Illness: [name] [name] is a 48 y.o. female presenting with chest pain.  Patient states that she had chest pain yesterday that she was thought was due to being overheated.  This was associated with some nausea without vomiting.  She came to the ER today due to dizziness and weakness, without any chest pain. Additionally had some problems with breathing which improved with inhaler. While in the ED, she started to develop chest pain again. Described as an uncomfortable sensation in her left arm and shoulder. This lasted for about 10-15 minutes. She did not feel this was bad enough to need nitroglycerin. She was given aspirin 324mg . No nausea or vomiting with this episode, however states that she did feel sweaty and shaky earlier today.   Patient reports that she gets chest pain about once every 2-3 weeks. Pain is not associated with exertion, it occurs randomly without specific triggers.   In the ED, initial work up was significant for a negative EKG and negative troponin x2. Cardiology was consulted in the ED and recommended that the patient be admitted for observation overnight.   Review Of Systems: Per HPI, otherwise 12 point review of systems was performed and was unremarkable.  Patient Active Problem List   Diagnosis Date Noted  . Unstable angina 05/25/2014  . Abdominal pain 03/11/2014  . Hot flashes 03/11/2014  . Anxiety and depression 01/01/2014  . COPD (chronic obstructive pulmonary disease) 06/03/2013  . Hyperlipidemia 02/11/2013  . CAD (coronary artery disease) 02/11/2013  . Tobacco abuse 02/10/2013  . Polysubstance abuse:  Cocaine, marijuana 02/10/2013  . Subclinical hyperthyroidism 02/10/2013  . NSTEMI (non-ST elevated myocardial infarction) 02/06/2013  . Breast lump on right side at 9 o'clock position 10/13/2012  . Breast lump on left side at 3 o'clock position 06/09/2012   Past Medical History: Past Medical History  Diagnosis Date  . Abnormal  Pap smear     Colpo (twice)  . Anemia   . Chlamydia     years ago  . Trichomonas     years ago  . Breast disorder     soreness in left breast   . Depression   . CAD (coronary artery disease)      a. 01/2013 NSTEMI/Cath: LM nl, LAD 44m, LCX nl, RI nl, RCA nl, PDA ostial nonocclusive intraluminal thrombus, EF 65%, Treated with heparin x 48 hrs->Med Rx (occurred in setting of + cocaine);  b. 01/2013 Echo: EF 60-65%, nl wall motion.  . Polysubstance abuse     tobacco/cocaine/marijuana (+ UDS 01/2013).  Marland Kitchen Heart disease     MI Jan 2015  . Anxiety and depression 01/01/2014  . Myocardial infarction    Past Surgical History: Past Surgical History  Procedure Laterality Date  . Breast surgery      lump removed from right breast  . Endometrial ablation w/ novasure    . Groin mass open biopsy    . Cervical cone biopsy    . Cesarean section      2 c-sections  . Ectopic pregnancy surgery    . Left heart catheterization with coronary angiogram N/A 02/08/2013    Procedure: LEFT HEART CATHETERIZATION WITH CORONARY ANGIOGRAM;  Surgeon: Sinclair Grooms, MD;  Location: [facility] CATH LAB;  Service: Cardiovascular;  Laterality: N/A;  . Tubal ligation     Social History: History  Substance Use Topics  . Smoking status: Former Smoker -- 0.50 packs/day for 31 years    Types: Cigarettes    Quit date: 02/05/2013  . Smokeless tobacco: Never Used  . Alcohol Use: 0.0 oz/week    0 Standard drinks or equivalent per week     Comment: rarely   Additional social history: +marijuana 3-4 times per week,  Denies other drug use Please also refer to relevant sections of EMR.  Family History: Family History  Problem Relation Age of Onset  . Breast cancer Mother   . Hypertension Brother   . Kidney failure Brother   . Hypertension Sister    Allergies and Medications: Allergies  Allergen Reactions  . Demerol Itching  . Latex Itching  . Novocain [Procaine Hcl] Swelling   No current facility-administered medications on file prior to encounter.   Current Outpatient Prescriptions on File Prior to Encounter  Medication Sig Dispense Refill  . acetaminophen (TYLENOL) 500 MG tablet Take 1,000 mg by mouth every 6  (six) hours as needed for mild pain.    Marland Kitchen albuterol (PROAIR HFA) 108 (90 BASE) MCG/ACT inhaler Inhale 2 puffs into the lungs every 4 (four) hours as needed for wheezing or shortness of breath. 1 Inhaler 11  . aspirin EC 81 MG EC tablet Take 1 tablet (81 mg total) by mouth daily.    . budesonide-formoterol (SYMBICORT) 160-4.5 MCG/ACT inhaler Inhale 2 puffs into the lungs 2 (two) times daily. 4 Inhaler 0  . carvedilol (COREG) 3.125 MG tablet Take 1 tablet (3.125 mg total) by mouth 2 (two) times daily with a meal. 60 tablet 0  . cetirizine (ZYRTEC) 10 MG tablet Take 10 mg by mouth daily.    . clopidogrel (PLAVIX) 75 MG tablet Take 1 tablet (75 mg total) by mouth daily with breakfast. 30 tablet 1  . furosemide (LASIX) 20 MG tablet Take 1 tablet (20 mg total) by mouth daily as needed. 20 tablet 3  . isosorbide mononitrate (IMDUR) 30 MG 24 hr tablet Take 0.5 tablets (15  mg total) by mouth daily. 90 tablet 3  . nitroGLYCERIN (NITROSTAT) 0.4 MG SL tablet Place 1 tablet (0.4 mg total) under the tongue every 5 (five) minutes x 3 doses as needed for chest pain. 25 tablet 3  . potassium chloride SA (K-DUR,KLOR-CON) 20 MEQ tablet Take 1 tablet (20 mEq total) by mouth daily. 90 tablet 2  . pravastatin (PRAVACHOL) 40 MG tablet Take 1 tablet (40 mg total) by mouth daily. 30 tablet 1    Objective: BP 140/89 mmHg  Pulse 73  Temp(Src) 98 F (36.7 C) (Oral)  Resp 18  Ht 5\' 3"  (1.6 m)  Wt 126 lb 14.4 oz (57.561 kg)  BMI 22.48 kg/m2  SpO2 100%  LMP 01/31/2014 Exam: General: 49yo female lying in hospital bed in NAD HEENT: NCAT, EOMI, MMM Cardiovascular: RRR, no murmurs appreciated Respiratory: NWOB, CTAB with no wheezes or crackles Abdomen: S, NT, ND Extremities: WWP, no edema, 2+ DP pulses Skin: No rashes Neuro: Alert and conversational, no focal neurological deficits.   Labs and Imaging: CBC BMET   Recent Labs Lab 05/25/14 1744  WBC 6.7  HGB 12.9  HCT 39.6  PLT 254    Recent Labs Lab  05/25/14 1744  NA 138  K 3.4*  CL 108  CO2 21  BUN 11  CREATININE 0.95  GLUCOSE 98  CALCIUM 9.0     Troponin 0.00, 0,01 BNP 27.8  EKG: NSR, no ischemic changes  Dg Chest 2 View  05/25/2014   CLINICAL DATA:  Chest pain today.  EXAM: CHEST  2 VIEW  COMPARISON:  12/17/2013  FINDINGS: The heart size and mediastinal contours are within normal limits. Both lungs are clear. The visualized skeletal structures are unremarkable.  IMPRESSION: Normal exam.   Electronically Signed   By: Lorriane Shire M.D.   On: 05/25/2014 18:52   Vivi Barrack, MD 05/26/2014, 12:46 AM PGY-1, [place] [facility] pager: [phone], text pages welcome  Upper Level Addendum:  I have seen and evaluated this patient along with Dr. [name] and reviewed the above note, making necessary revisions in pink.   Chrisandra Netters, MD Family Medicine PGY-3

## 2014-05-26 NOTE — Discharge Summary (Signed)
[facility] Discharge Summary  Patient name: [name] [name] Medical record number: [id number] Date of birth: 03-08-1965 Age: 49 y.o. Gender: female Date of Admission: 05/25/2014  Date of Discharge: 05/26/2014 Admitting Physician: Alveda Reasons, MD  Primary Care Provider: Rosemarie Ax, MD Consultants: Cardiology  Indication for Hospitalization: Chest Pain  Discharge Diagnoses/Problem List:  CAD (NSTEMI with VF in the setting of cocaine abuse), Asthma, COPD  Disposition: Home  Discharge Condition: Improved  Discharge Exam:  Blood pressure 131/81, pulse 93, temperature 98.7 F (37.1 C), temperature source Oral, resp. rate 18, height 5\' 3"  (1.6 m), weight 126 lb 14.4 oz (57.561 kg), last menstrual period 01/31/2014, SpO2 98 %. General: 49yo female lying in hospital bed in NAD HEENT: NCAT, EOMI, MMM Cardiovascular: RRR, no murmurs appreciated Respiratory: NWOB, CTAB with no wheezes or crackles Abdomen: S, NT, ND Extremities: WWP, no edema Skin: No rashes Neuro: Alert and conversational, no focal neurological deficits.  Brief Hospital Course:  [name] [name] is a 49 year old female who presented to the ED with dizziness and weakness and subsequently developed chest pain while in the ED. Given her past history of CAD (NSTEMI with VF in the setting of cocaine abuse), she was admitted for ACS rule out. By the time of admission, the patient's chest pain had completely resolved. She had 3 negative troponins and her EKGs showed no signs of acute ischemic disease. Cardiology was involved in her care and determined that no further investigation was needed. She was discharged home in stable condition.  Issues for Follow Up:  1. Patient adamantly refused cocaine use, however was noted to have suspicious behavior here, including not giving a urine sample. Consider reinforcing abstinence, especially in the setting of being on a beta blocker. 2. If continues to have  dizziness, consider cardiac event monitor   Significant Procedures: None  Significant Labs and Imaging:   Recent Labs Lab 05/25/14 1744  WBC 6.7  HGB 12.9  HCT 39.6  PLT 254    Recent Labs Lab 05/25/14 1744  NA 138  K 3.4*  CL 108  CO2 21  GLUCOSE 98  BUN 11  CREATININE 0.95  CALCIUM 9.0   Troponin negative x3 TSH 0.226  EKG: NSR, no ischemic changes  Results/Tests Pending at Time of Discharge: None  Discharge Medications:    Medication List    TAKE these medications        acetaminophen 500 MG tablet  Commonly known as:  TYLENOL  Take 1,000 mg by mouth every 6 (six) hours as needed for mild pain.     albuterol 108 (90 BASE) MCG/ACT inhaler  Commonly known as:  PROAIR HFA  Inhale 2 puffs into the lungs every 4 (four) hours as needed for wheezing or shortness of breath.     aspirin 81 MG EC tablet  Take 1 tablet (81 mg total) by mouth daily.     budesonide-formoterol 160-4.5 MCG/ACT inhaler  Commonly known as:  SYMBICORT  Inhale 2 puffs into the lungs 2 (two) times daily.     carvedilol 3.125 MG tablet  Commonly known as:  COREG  Take 1 tablet (3.125 mg total) by mouth 2 (two) times daily with a meal.     cetirizine 10 MG tablet  Commonly known as:  ZYRTEC  Take 10 mg by mouth daily.     clopidogrel 75 MG tablet  Commonly known as:  PLAVIX  Take 1 tablet (75 mg total) by mouth daily with breakfast.  furosemide 20 MG tablet  Commonly known as:  LASIX  Take 1 tablet (20 mg total) by mouth daily as needed.     ibuprofen 200 MG tablet  Commonly known as:  ADVIL,MOTRIN  Take 200 mg by mouth every 6 (six) hours as needed for mild pain.     isosorbide mononitrate 30 MG 24 hr tablet  Commonly known as:  IMDUR  Take 0.5 tablets (15 mg total) by mouth daily.     nitroGLYCERIN 0.4 MG SL tablet  Commonly known as:  NITROSTAT  Place 1 tablet (0.4 mg total) under the tongue every 5 (five) minutes x 3 doses as needed for chest pain.     potassium  chloride SA 20 MEQ tablet  Commonly known as:  K-DUR,KLOR-CON  Take 1 tablet (20 mEq total) by mouth daily.     pravastatin 40 MG tablet  Commonly known as:  PRAVACHOL  Take 1 tablet (40 mg total) by mouth daily.        Discharge Instructions: Please refer to Patient Instructions section of EMR for full details.  Patient was counseled important signs and symptoms that should prompt return to medical care, changes in medications, dietary instructions, activity restrictions, and follow up appointments.   Follow-Up Appointments: Follow-up Information    Follow up with Rosemarie Ax, MD On 06/03/2014.   Specialty:  Family Medicine   Why:  9:30am   Contact information:   [place] [address] [phone]       Vivi Barrack, MD 05/28/2014, 1:03 PM PGY-1, [place]

## 2014-05-27 LAB — HEMOGLOBIN A1C
Hgb A1c MFr Bld: 6 % — ABNORMAL HIGH (ref 4.8–5.6)
MEAN PLASMA GLUCOSE: 126 mg/dL

## 2014-06-03 ENCOUNTER — Encounter: Payer: Self-pay | Admitting: Family Medicine

## 2014-06-03 ENCOUNTER — Ambulatory Visit (INDEPENDENT_AMBULATORY_CARE_PROVIDER_SITE_OTHER): Payer: Self-pay | Admitting: Family Medicine

## 2014-06-03 VITALS — BP 110/75 | HR 73 | Temp 98.2°F | Ht 63.0 in | Wt 131.2 lb

## 2014-06-03 DIAGNOSIS — I208 Other forms of angina pectoris: Secondary | ICD-10-CM

## 2014-06-03 DIAGNOSIS — E0590 Thyrotoxicosis, unspecified without thyrotoxic crisis or storm: Secondary | ICD-10-CM

## 2014-06-03 NOTE — Patient Instructions (Signed)
Thank you for coming in,   I will call you with the results from today. That will determine our management of your thyroid.   Please follow up with me in 3 months.   Please bring all of your medications with you to each visit.    Please feel free to call with any questions or concerns at any time, at [phone]. --Dr. Raeford Razor   Hyperthyroidism The thyroid is a large gland located in the lower front part of your neck. The thyroid helps control metabolism. Metabolism is how your body uses food. It controls metabolism with the hormone thyroxine. When the thyroid is overactive, it produces too much hormone. When this happens, these following problems may occur:   Nervousness  Heat intolerance  Weight loss (in spite of increase food intake)  Diarrhea  Change in hair or skin texture  Palpitations (heart skipping or having extra beats)  Tachycardia (rapid heart rate)  Loss of menstruation (amenorrhea)  Shaking of the hands CAUSES  Grave's Disease (the immune system attacks the thyroid gland). This is the most common cause.  Inflammation of the thyroid gland.  Tumor (usually benign) in the thyroid gland or elsewhere.  Excessive use of thyroid medications (both prescription and 'natural').  Excessive ingestion of Iodine. DIAGNOSIS  To prove hyperthyroidism, your caregiver may do blood tests and ultrasound tests. Sometimes the signs are hidden. It may be necessary for your caregiver to watch this illness with blood tests, either before or after diagnosis and treatment. TREATMENT Short-term treatment There are several treatments to control symptoms. Drugs called beta blockers may give some relief. Drugs that decrease hormone production will provide temporary relief in many people. These measures will usually not give permanent relief. Definitive therapy There are treatments available which can be discussed between you and your caregiver which will permanently treat the problem.  These treatments range from surgery (removal of the thyroid), to the use of radioactive iodine (destroys the thyroid by radiation), to the use of antithyroid drugs (interfere with hormone synthesis). The first two treatments are permanent and usually successful. They most often require hormone replacement therapy for life. This is because it is impossible to remove or destroy the exact amount of thyroid required to make a person euthyroid (normal). HOME CARE INSTRUCTIONS  See your caregiver if the problems you are being treated for get worse. Examples of this would be the problems listed above. SEEK MEDICAL CARE IF: Your general condition worsens. MAKE SURE YOU:   Understand these instructions.  Will watch your condition.  Will get help right away if you are not doing well or get worse. Document Released: 01/14/2005 Document Revised: 04/08/2011 Document Reviewed: 05/28/2006 [facility] Patient Information 2015 [place], [place]. This information is not intended to replace advice given to you by your health care provider. Make sure you discuss any questions you have with your health care provider.

## 2014-06-03 NOTE — Progress Notes (Signed)
Patient dropped off insurance forms to be filled out.  Please call her when competed.

## 2014-06-03 NOTE — Progress Notes (Signed)
   Subjective:    Patient ID: [name] [name], female    DOB: [birth year], 49 y.o.   MRN: [id number]  HPI  [name] [name] is here for Hospital f/u.   Hospital f/u: recent admission for chest pain. Cardiology was consulted during the admission and found her chest pain to be non-cardiac in nature.  She had left arm pain last night after having intercourse. THe pain started at her left shoulder and radiated down to her fingers. She took a nitro and this resolved. This pain was a 6/10 compared to when she had her MI it was a 20/10.  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 She doesn't eat well and doesn't exercise. Hx of substance abuse and would not provide a urine sample during her admission.   Hyperthyroidism: hx of and not previously on medication. Has chest pain intermittently and with previous hx of MI.  Reports vague complaints.     Current Outpatient Prescriptions on File Prior to Visit  Medication Sig Dispense Refill  . acetaminophen (TYLENOL) 500 MG tablet Take 1,000 mg by mouth every 6 (six) hours as needed for mild pain.    Marland Kitchen albuterol (PROAIR HFA) 108 (90 BASE) MCG/ACT inhaler Inhale 2 puffs into the lungs every 4 (four) hours as needed for wheezing or shortness of breath. 1 Inhaler 11  . aspirin EC 81 MG EC tablet Take 1 tablet (81 mg total) by mouth daily.    . budesonide-formoterol (SYMBICORT) 160-4.5 MCG/ACT inhaler Inhale 2 puffs into the lungs 2 (two) times daily. 4 Inhaler 0  . carvedilol (COREG) 3.125 MG tablet Take 1 tablet (3.125 mg total) by mouth 2 (two) times daily with a meal. 60 tablet 0  . cetirizine (ZYRTEC) 10 MG tablet Take 10 mg by mouth daily.    . clopidogrel (PLAVIX) 75 MG tablet Take 1 tablet (75 mg total) by mouth daily with breakfast. 30 tablet 1  . furosemide (LASIX) 20 MG tablet Take 1 tablet (20 mg total) by mouth daily as needed. 20 tablet 3  . ibuprofen (ADVIL,MOTRIN) 200 MG tablet Take 200 mg by mouth every 6 (six) hours as needed for mild  pain.    . isosorbide mononitrate (IMDUR) 30 MG 24 hr tablet Take 0.5 tablets (15 mg total) by mouth daily. 90 tablet 3  . nitroGLYCERIN (NITROSTAT) 0.4 MG SL tablet Place 1 tablet (0.4 mg total) under the tongue every 5 (five) minutes x 3 doses as needed for chest pain. 25 tablet 3  . potassium chloride SA (K-DUR,KLOR-CON) 20 MEQ tablet Take 1 tablet (20 mEq total) by mouth daily. 90 tablet 2  . pravastatin (PRAVACHOL) 40 MG tablet Take 1 tablet (40 mg total) by mouth daily. 30 tablet 1   No current facility-administered medications on file prior to visit.    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   Health Maintenance: HIV screening.    Review of Systems See HPI     Objective:   Physical Exam BP 110/75 mmHg  Pulse 73  Temp(Src) 98.2 F (36.8 C) (Oral)  Ht 5\' 3"  (1.6 m)  Wt 131 lb 3.2 oz (59.512 kg)  BMI 23.25 kg/m2 Gen: NAD, alert, cooperative with exam, well-appearing HEENT: NCAT, clear conjunctiva, no goiter, no LAD CV: RRR, good S1/S2, no murmur, no edema, capillary refill brisk  Resp: CTABL, no wheezes, non-labored Skin: no rashes, normal turgor  Neuro: no gross deficits.      Assessment & Plan:

## 2014-06-04 LAB — TSH: TSH: 0.193 u[IU]/mL — ABNORMAL LOW (ref 0.350–4.500)

## 2014-06-04 LAB — T4, FREE: Free T4: 0.99 ng/dL (ref 0.80–1.80)

## 2014-06-04 LAB — T3, FREE: T3, Free: 3.1 pg/mL (ref 2.3–4.2)

## 2014-06-05 ENCOUNTER — Encounter: Payer: Self-pay | Admitting: Family Medicine

## 2014-06-05 MED ORDER — NITROGLYCERIN 0.4 MG SL SUBL: 0.4000 mg | SUBLINGUAL_TABLET | SUBLINGUAL | Status: DC | PRN

## 2014-06-05 NOTE — Assessment & Plan Note (Signed)
Reporting pain after intercourse and relieved with nitro. Recent admission showing no cardiac source to her pain.  Hx of substance abuse and did not provide urine sample during admission.  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 Need to make sure are treated going forward. - PHQ-9 and GAD-7 at f/u  - cont nitro PRN  - cont current med regimen  - has f/u in one month with Cardiology

## 2014-06-05 NOTE — Assessment & Plan Note (Signed)
TSH low but normal Free T4 and Free T3.  - already on Bblocker  - re-test in 6 months.

## 2014-06-06 ENCOUNTER — Telehealth: Payer: Self-pay | Admitting: Family Medicine

## 2014-06-06 NOTE — Progress Notes (Signed)
Papers placed in PCP box. Katharina Caper, Marley Pakula D

## 2014-06-06 NOTE — Telephone Encounter (Signed)
Called patient and informed of her lab results. TSH is low but Free T4, Free T3 normal and already on coreg.   Rosemarie Ax, MD PGY-2, [facility] 06/06/2014, 12:19 PM

## 2014-06-08 ENCOUNTER — Telehealth: Payer: Self-pay | Admitting: Family Medicine

## 2014-06-08 NOTE — Telephone Encounter (Signed)
Called the insurance company about the claim. There is no parts for me to fill in. It is to be completed by the patient. She needs to call the company and ask how to fill it out. Company phone number [phone].  Will place in Tamika's box for patient to pick up.   Rosemarie Ax, MD PGY-2, [facility] 06/08/2014, 9:52 AM

## 2014-06-08 NOTE — Telephone Encounter (Signed)
Spoke with patient regarding Catering manager.  Pt stated that the claim is for reimbursement for her hospital stay.  Provider for suppose to complete the last page on the back. Form placed in provider box.  Derl Barrow, RN

## 2014-06-10 NOTE — Telephone Encounter (Signed)
Pt called to check the status of paper for insurance reimbursement.  Pt informed that PCP is aware of what to complete and review paper work again.  Paper work in provider box for review.  Derl Barrow, RN

## 2014-06-13 NOTE — Telephone Encounter (Signed)
Filled out form and placed in Tamika's box.   Rosemarie Ax, MD PGY-2, [facility] 06/13/2014, 9:20 AM

## 2014-06-19 ENCOUNTER — Other Ambulatory Visit: Payer: Self-pay | Admitting: Cardiology

## 2014-06-22 ENCOUNTER — Encounter: Payer: Self-pay | Admitting: Family Medicine

## 2014-06-22 NOTE — Progress Notes (Signed)
Patient dropped off form to be completed for her to get paid while she was out of work.  She said first form was not completed correctly.  See note on request form.

## 2014-06-22 NOTE — Progress Notes (Signed)
Form placed in PCP box. Zimmerman Rumple, April D, CMA  

## 2014-06-23 ENCOUNTER — Encounter: Payer: Self-pay | Admitting: Family Medicine

## 2014-06-23 ENCOUNTER — Ambulatory Visit (INDEPENDENT_AMBULATORY_CARE_PROVIDER_SITE_OTHER): Payer: Self-pay | Admitting: Family Medicine

## 2014-06-23 VITALS — BP 97/70 | HR 94 | Temp 99.5°F | Ht 63.0 in | Wt 126.2 lb

## 2014-06-23 DIAGNOSIS — K529 Noninfective gastroenteritis and colitis, unspecified: Secondary | ICD-10-CM

## 2014-06-23 MED ORDER — ONDANSETRON 8 MG PO TBDP: 8.0000 mg | ORAL_TABLET | Freq: Three times a day (TID) | ORAL | Status: DC | PRN

## 2014-06-23 NOTE — Patient Instructions (Signed)
Norovirus Infection Norovirus illness is caused by a viral infection. The term norovirus refers to a group of viruses. Any of those viruses can cause norovirus illness. This illness is often referred to by other names such as viral gastroenteritis, stomach flu, and food poisoning. Anyone can get a norovirus infection. People can have the illness multiple times during their lifetime. CAUSES  Norovirus is found in the stool or vomit of infected people. It is easily spread from person to person (contagious). People with norovirus are contagious from the moment they begin feeling ill. They may remain contagious for as long as 3 days to 2 weeks after recovery. People can become infected with the virus in several ways. This includes:  Eating food or drinking liquids that are contaminated with norovirus.  Touching surfaces or objects contaminated with norovirus, and then placing your hand in your mouth.  Having direct contact with a person who is infected and shows symptoms. This may occur while caring for someone with illness or while sharing foods or eating utensils with someone who is ill. SYMPTOMS  Symptoms usually begin 1 to 2 days after ingestion of the virus. Symptoms may include:  Nausea.  Vomiting.  Diarrhea.  Stomach cramps.  Low-grade fever.  Chills.  Headache.  Muscle aches.  Tiredness. Most people with norovirus illness get better within 1 to 2 days. Some people become dehydrated because they cannot drink enough liquids to replace those lost from vomiting and diarrhea. This is especially true for young children, the elderly, and others who are unable to care for themselves. DIAGNOSIS  Diagnosis is based on your symptoms and exam. Currently, only state public health laboratories have the ability to test for norovirus in stool or vomit. TREATMENT  No specific treatment exists for norovirus infections. No vaccine is available to prevent infections. Norovirus illness is usually  brief in healthy people. If you are ill with vomiting and diarrhea, you should drink enough water and fluids to keep your urine clear or pale yellow. Dehydration is the most serious health effect that can result from this infection. By drinking oral rehydration solution (ORS), people can reduce their chance of becoming dehydrated. There are many commercially available pre-made and powdered ORS designed to safely rehydrate people. These may be recommended by your caregiver. Replace any new fluid losses from diarrhea or vomiting with ORS as follows:  If your child weighs 10 kg or less (22 lb or less), give 60 to 120 ml ( to  cup or 2 to 4 oz) of ORS for each diarrheal stool or vomiting episode.  If your child weighs more than 10 kg (more than 22 lb), give 120 to 240 ml ( to 1 cup or 4 to 8 oz) of ORS for each diarrheal stool or vomiting episode. HOME CARE INSTRUCTIONS   Follow all your caregiver's instructions.  Avoid sugar-free and alcoholic drinks while ill.  Only take over-the-counter or prescription medicines for pain, vomiting, diarrhea, or fever as directed by your caregiver. You can decrease your chances of coming in contact with norovirus or spreading it by following these steps:  Frequently wash your hands, especially after using the toilet, changing diapers, and before eating or preparing food.  Carefully wash fruits and vegetables. Cook shellfish before eating them.  Do not prepare food for others while you are infected and for at least 3 days after recovering from illness.  Thoroughly clean and disinfect contaminated surfaces immediately after an episode of illness using a bleach-based household cleaner.  Immediately remove and wash clothing or linens that may be contaminated with the virus.  Use the toilet to dispose of any vomit or stool. Make sure the surrounding area is kept clean.  Food that may have been contaminated by an ill person should be discarded. SEEK IMMEDIATE  MEDICAL CARE IF:   You develop symptoms of dehydration that do not improve with fluid replacement. This may include:  Excessive sleepiness.  Lack of tears.  Dry mouth.  Dizziness when standing.  Weak pulse. Document Released: 04/06/2002 Document Revised: 04/08/2011 Document Reviewed: 05/08/2009 [facility] Patient Information 2015 [place], [place]. This information is not intended to replace advice given to you by your health care provider. Make sure you discuss any questions you have with your health care provider.

## 2014-06-23 NOTE — Progress Notes (Signed)
  Subjective:     [name] [name] is a 49 y.o. female who presents for evaluation of diarrhea 5 times per day and nausea. Symptoms have been present for 2 days. Patient denies aching, boring, burning and numbing pain located in in the epigastrium, in the periumbilical area, in the RUQ, in the RLQ, in the LUQ and in the LLQ, acholic stools, blood in stool, dark urine, dysuria, fever, hematemesis and hematuria. Patient's oral intake has been normal. Patient's urine output has been adequate. Other contacts with similar symptoms include: daycare. Patient denies recent travel history. Patient has not had recent ingestion of possible contaminated food, toxic plants, or inappropriate medications/poisons.   The following portions of the patient's history were reviewed and updated as appropriate: allergies, current medications, past family history, past medical history, past social history, past surgical history and problem list.  Review of Systems Pertinent items are noted in HPI.    Objective:     BP 97/70 mmHg  Pulse 94  Temp(Src) 99.5 F (37.5 C) (Oral)  Ht 5\' 3"  (1.6 m)  Wt 126 lb 3.2 oz (57.244 kg)  BMI 22.36 kg/m2 General appearance: alert, cooperative and appears stated age Head: Normocephalic, without obvious abnormality, atraumatic Throat: MMM, O/P clear Heart: regular rate and rhythm Abdomen: normal findings: bowel sounds normal, no masses palpable, no organomegaly, soft, non-tender and symmetric    Assessment:    Acute Gastroenteritis    Plan:    1. Discussed oral rehydration, reintroduction of solid foods, signs of dehydration. 2. Return or go to emergency department if worsening symptoms, blood or bile, signs of dehydration, diarrhea lasting longer than 5 days or any new concerns. 3. Follow up in 7 days or sooner as needed.

## 2014-07-05 ENCOUNTER — Telehealth: Payer: Self-pay | Admitting: Family Medicine

## 2014-07-05 NOTE — Telephone Encounter (Signed)
Returned tamika's call

## 2014-07-05 NOTE — Progress Notes (Signed)
Pt called today checking the status of forms that were placed in provider box a week or two ago.  The first set of forms were not completed correctly.  Please advise.  Derl Barrow, RN

## 2014-07-08 NOTE — Telephone Encounter (Signed)
Form completed and put in Tamika's box.   Rosemarie Ax, MD PGY-2, [facility] 07/08/2014, 2:34 PM

## 2014-07-08 NOTE — Telephone Encounter (Signed)
Unable to leave a voice message that forms are complete and ready for pick up.  Derl Barrow, RN

## 2014-07-17 ENCOUNTER — Other Ambulatory Visit: Payer: Self-pay | Admitting: Cardiology

## 2014-07-18 ENCOUNTER — Other Ambulatory Visit: Payer: Self-pay

## 2014-07-18 MED ORDER — CARVEDILOL 3.125 MG PO TABS: 3.1250 mg | ORAL_TABLET | Freq: Two times a day (BID) | ORAL | Status: DC

## 2014-07-20 ENCOUNTER — Other Ambulatory Visit: Payer: Self-pay | Admitting: Cardiology

## 2014-07-21 ENCOUNTER — Other Ambulatory Visit: Payer: Self-pay

## 2014-07-21 MED ORDER — CARVEDILOL 3.125 MG PO TABS: 3.1250 mg | ORAL_TABLET | Freq: Two times a day (BID) | ORAL | Status: DC

## 2014-08-19 ENCOUNTER — Other Ambulatory Visit: Payer: Self-pay | Admitting: Cardiology

## 2014-08-24 ENCOUNTER — Telehealth: Payer: Self-pay | Admitting: Cardiology
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
## 2014-08-24 NOTE — Telephone Encounter (Signed)
New message      Pt c/o of Chest Pain: STAT if CP now or developed within 24 hours  1. Are you having CP right now? Strong palpitations in chest now 2. Are you experiencing any other symptoms (ex. SOB, nausea, vomiting, sweating)? no 3. How long have you been experiencing CP?  Last couple of days 4. Is your CP continuous or coming and going? Comes and goes 5. Have you taken Nitroglycerin? no?

## 2014-08-25 ENCOUNTER — Ambulatory Visit (INDEPENDENT_AMBULATORY_CARE_PROVIDER_SITE_OTHER): Payer: Self-pay

## 2014-08-25 ENCOUNTER — Telehealth: Payer: Self-pay | Admitting: Cardiology

## 2014-08-25 DIAGNOSIS — R002 Palpitations: Secondary | ICD-10-CM

## 2014-08-25 NOTE — Telephone Encounter (Signed)
New message      Pt called her PCP and they told her to call her cardiologist.  She is having "heavy" palpitations like feelings in her chest.  Please advise

## 2014-08-25 NOTE — Telephone Encounter (Signed)
Pt called again today with C/O of palpitations with aching pain in her chest, for the last 4 days. The pain is not constant it comes and goes. In an hour pt had 5 to 6 episodes of palpitations and chest pain. Pt denies SOB. The pain is different than before. Pt took NTG SL two days ago with some relief.  Pt takes Imdur 30 mg daily.  Rosaria Ferries PA aware she recommends for pt to have a 48 hours Holter monitor to see what kind of arrhythmias  she has, before she is seen in the office. Pt needs an appointment After the Holter monitor results; with the Flex next week.

## 2014-08-30 NOTE — Telephone Encounter (Signed)
Contacted the pt to ask her current symptoms and to inform her that her 48 hour holter monitor results have not completely processed, for the pt just turned the monitor in yesterday.  Pt reports that since her complaints called in on 7/28, she is now starting to feel much better.  Pt states her palpitations, cp, and sob has improved. Informed the pt that once her monitor results have been processed and reviewed by Dr Meda Coffee, we will follow-up with her with her results and advise if the pt needs to have a follow-up appt with the flex for sometime next week, or just keep her already scheduled follow-up appt with Dr Meda Coffee for 9/29 0930.  Pt verbalized understanding and agrees with this plan.  Will route this message to Dr Meda Coffee as an Juluis Rainier.

## 2014-09-02 ENCOUNTER — Telehealth: Payer: Self-pay | Admitting: Cardiology

## 2014-09-02 NOTE — Telephone Encounter (Signed)
Informed the pt that per Dr Meda Coffee her 48 hour holter monitor results showed infrequent PVCs, otherwise normal, no therapy needed, and keep her scheduled follow-up appt in September with Dr Meda Coffee.  Pt verbalized understanding and agrees with this plan.

## 2014-09-02 NOTE — Telephone Encounter (Signed)
Will route this message to Dr Meda Coffee in regards to pt wanting test results (48 hour holter).

## 2014-09-02 NOTE — Telephone Encounter (Signed)
New message     Pt calling regarding test results Please call to discuss

## 2014-09-02 NOTE — Telephone Encounter (Signed)
HOlter shows infrequent PVCs, otherwise normal, no therapy needed.

## 2014-09-25 ENCOUNTER — Other Ambulatory Visit: Payer: Self-pay | Admitting: Cardiology

## 2014-10-12 ENCOUNTER — Other Ambulatory Visit: Payer: Self-pay | Admitting: Cardiology

## 2014-10-27 ENCOUNTER — Ambulatory Visit (INDEPENDENT_AMBULATORY_CARE_PROVIDER_SITE_OTHER): Payer: Self-pay | Admitting: Cardiology

## 2014-10-27 ENCOUNTER — Encounter: Payer: Self-pay | Admitting: Cardiology

## 2014-10-27 VITALS — BP 116/74 | HR 74 | Ht 63.0 in | Wt 122.0 lb

## 2014-10-27 DIAGNOSIS — I214 Non-ST elevation (NSTEMI) myocardial infarction: Secondary | ICD-10-CM

## 2014-10-27 DIAGNOSIS — E785 Hyperlipidemia, unspecified: Secondary | ICD-10-CM

## 2014-10-27 DIAGNOSIS — I309 Acute pericarditis, unspecified: Secondary | ICD-10-CM

## 2014-10-27 DIAGNOSIS — I2583 Coronary atherosclerosis due to lipid rich plaque: Principal | ICD-10-CM

## 2014-10-27 DIAGNOSIS — I10 Essential (primary) hypertension: Secondary | ICD-10-CM

## 2014-10-27 DIAGNOSIS — I2510 Atherosclerotic heart disease of native coronary artery without angina pectoris: Secondary | ICD-10-CM

## 2014-10-27 NOTE — Patient Instructions (Signed)
Medication Instructions:   Your physician recommends that you continue on your current medications as directed. Please refer to the Current Medication list given to you today.     Labwork:  IN 6 MONTHS, PRIOR TO YOUR 6 MONTH FOLLOW-UP APPOINTMENT WITH DR Meda Coffee TO CHECK A CMET AND LIPIDS--PLEASE COME FASTING TO THIS APPOINTMENT    Testing/Procedures:  Your physician has requested that you have an echocardiogram. Echocardiography is a painless test that uses sound waves to create images of your heart. It provides your doctor with information about the size and shape of your heart and how well your heart's chambers and valves are working. This procedure takes approximately one hour. There are no restrictions for this procedure.  PER DR NELSON, SHE WOULD LIKE THIS SCHEDULED AND PERFORMED PRIOR TO YOUR 6 MONTH FOLLOW-UP APPOINTMENT WITH HER    Follow-Up:  Your physician wants you to follow-up in: [place] will receive a reminder letter in the mail two months in advance. If you don't receive a letter, please call our office to schedule the follow-up appointment.  PLEASE HAVE YOUR LABS AND ECHO DONE PRIOR TO THIS APPOINTMENT

## 2014-10-27 NOTE — Progress Notes (Signed)
Patient ID: [name] [name], female   DOB: 11/15/1965, 49 y.o.   MRN: [id number] Patient ID: [name] [name], female   DOB: 09-10-1965, 49 y.o.   MRN: [id number]    Evita Merida Date of Birth: 03-Jun-1965 Medical Record #[id number]   Chief complain: Chest pain  History of Present Illness: Ms. [name] [name] is seen back today for a work in visit. Seen for Dr. Meda Coffee. 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 She did not require PCI. EF was normal. Other issues as noted. Has been in cardiac rehab and did well.  Comes back today. Here alone. She states that her shortness of breath and lower extremity edema has resolved. She has been taking Lasix 20 mg daily as needed. She denies any arm chest pain, orthopnea, PND. No syncope. She states that occasionally she will have a few second lasting palpitations they're not associated with any other symptoms such as dizziness or shortness of breath.  Sister did accompany her to the office but not to the room due to last OV note mentioning that she did not wish to have substance abuse issues discussed with anyone else present.   11/03/2013 - the patient reports recurrence of chest pain, especially on exertion, 3-4 times per week, much milder than when she had NSTEMI. No SOB, plapitations, syncope. The pain resolves within minutes with NTG.  12/17/2013 - the patient is coming to our office and states that her pain has significantly improved with using Ranolazine. However, while she is in office she develops left-sided chest pain radiating to her left arm. EKG performed in our office has new negative T waves in anterior leads, in addition to the negative T waves in the inferior that were present on the prior EKG. We gave her sublingual nitroglycerin that brought her pain from 8-4 out of 10. We will send the patient to the ER to rule out non-STEMI.    01/10/2014 -  The patient was sent to the ER and her last office visit on 12/17/2013 for acute chest pain and EKG  changes.  Myocardial infarction was ruled out and she was diagnosed with pericarditis and started on 5 day prednisone taper. Her symptoms have typically resolved and she hasn't experienced any chest pain ever since. She has any lower extremity edema, palpitations syncope orthopnea or proximal nocturnal dyspnea.  10/27/2014 - the patient is coming after 6 months, she feels well, in the interim she developed palpitations, 48-hour Holter monitor showed infrequent PVCs only. Those have esolved, no CP, DOE, LE edema, Lasix PRN only.     Current Outpatient Prescriptions  Medication Sig Dispense Refill  . acetaminophen (TYLENOL) 500 MG tablet Take 1,000 mg by mouth every 6 (six) hours as needed for mild pain.    Marland Kitchen albuterol (PROAIR HFA) 108 (90 BASE) MCG/ACT inhaler Inhale 2 puffs into the lungs every 4 (four) hours as needed for wheezing or shortness of breath. 1 Inhaler 11  . aspirin EC 81 MG EC tablet Take 1 tablet (81 mg total) by mouth daily.    . budesonide-formoterol (SYMBICORT) 160-4.5 MCG/ACT inhaler Inhale 2 puffs into the lungs 2 (two) times daily. 4 Inhaler 0  . carvedilol (COREG) 3.125 MG tablet Take 1 tablet (3.125 mg total) by mouth 2 (two) times daily with a meal. 60 tablet 3  . cetirizine (ZYRTEC) 10 MG tablet Take 10 mg by mouth daily.    . clopidogrel (PLAVIX) 75 MG tablet Take 1 tablet (75 mg total) by mouth daily  with breakfast. 30 tablet 1  . furosemide (LASIX) 20 MG tablet Take 20 mg by mouth as needed. FOR SWELLING    . isosorbide mononitrate (IMDUR) 30 MG 24 hr tablet Take 0.5 tablets (15 mg total) by mouth daily. 90 tablet 3  . nitroGLYCERIN (NITROSTAT) 0.4 MG SL tablet Place 1 tablet (0.4 mg total) under the tongue every 5 (five) minutes x 3 doses as needed for chest pain. 25 tablet 3  . ondansetron (ZOFRAN ODT) 8 MG disintegrating tablet Take 1 tablet (8 mg total) by mouth every 8 (eight) hours as needed for nausea. 20 tablet 0  . pravastatin (PRAVACHOL) 40 MG tablet Take 1  tablet (40 mg total) by mouth daily. 30 tablet 5   No current facility-administered medications for this visit.    Allergies  Allergen Reactions  . Demerol Itching  . Latex Itching  . Novocain [Procaine Hcl] Swelling    Past Medical History  Diagnosis Date  . Abnormal Pap smear     Colpo (twice)  . Anemia   . Chlamydia     years ago  . Trichomonas     years ago  . Breast disorder     soreness in left breast   . Depression   . CAD (coronary artery disease)     a. 01/2013 NSTEMI/Cath: LM nl, LAD 53m, LCX nl, RI nl, RCA nl, PDA ostial nonocclusive intraluminal thrombus, EF 65%, Treated with heparin x 48 hrs->Med Rx (occurred in setting of + cocaine);  b. 01/2013 Echo: EF 60-65%, nl wall motion.  . Polysubstance abuse     tobacco/cocaine/marijuana (+ UDS 01/2013).  Marland Kitchen Heart disease     MI Jan 2015  . Anxiety and depression 01/01/2014  . Myocardial infarction     Past Surgical History  Procedure Laterality Date  . Breast surgery      lump removed from right breast  . Endometrial ablation w/ novasure    . Groin mass open biopsy    . Cervical cone biopsy    . Cesarean section      2 c-sections  . Ectopic pregnancy surgery    . Left heart catheterization with coronary angiogram N/A 02/08/2013    Procedure: LEFT HEART CATHETERIZATION WITH CORONARY ANGIOGRAM;  Surgeon: Sinclair Grooms, MD;  Location: [facility] C[facility];  Service: Cardiovascular;  Laterality: N/A;  . Tubal ligation      History  Smoking status  . Former Smoker -- 0.50 packs/day for 31 years  . Types: Cigarettes  . Quit date: 02/05/2013  Smokeless tobacco  . Never Used    History  Alcohol Use  . 0.0 oz/week  . 0 Standard drinks or equivalent per week    Comment: rarely    Family History  Problem Relation Age of Onset  . Breast cancer Mother   . Hypertension Brother   . Kidney failure Brother   . Hypertension Sister     Review of Systems: The review of systems is per the HPI.  All other systems were  reviewed and are negative.  Physical Exam: BP 116/74 mmHg  Pulse 74  Ht 5\' 3"  (1.6 m)  Wt 122 lb (55.339 kg)  BMI 21.62 kg/m2 Patient is pleasant and in no acute distress. Skin is warm and dry. Color is normal.  HEENT is unremarkable. Normocephalic/atraumatic. PERRL. Sclera are nonicteric. Neck is supple. No masses. No JVD. Lungs are clear. Cardiac exam shows a regular rate and rhythm. Abdomen is soft. Extremities are with trace lower  extremity edema. Gait and ROM are intact. No gross neurologic deficits noted.  Wt Readings from Last 3 Encounters:  10/27/14 122 lb (55.339 kg)  06/23/14 126 lb 3.2 oz (57.244 kg)  06/03/14 131 lb 3.2 oz (59.512 kg)    LABORATORY DATA: PENDING  Lab Results  Component Value Date   WBC 6.7 05/25/2014   HGB 12.9 05/25/2014   HCT 39.6 05/25/2014   PLT 254 05/25/2014   GLUCOSE 98 05/25/2014   CHOL 149 11/05/2013   TRIG 66.0 11/05/2013   HDL 41.80 11/05/2013   LDLCALC 94 11/05/2013   ALT 16 03/10/2014   AST 15 03/10/2014   NA 138 05/25/2014   K 3.4* 05/25/2014   CL 108 05/25/2014   CREATININE 0.95 05/25/2014   BUN 11 05/25/2014   CO2 21 05/25/2014   TSH 0.193* 06/03/2014   INR 1.13 02/06/2013   HGBA1C 6.0* 05/26/2014    Exercise nuclear stress test: 11/10/2013 Impression Exercise Capacity: Good exercise capacity. BP Response: Hypotensive blood pressure response. Clinical Symptoms: There is dyspnea. ECG Impression: No significant ST segment change suggestive of ischemia. Comparison with Prior Nuclear Study: No images to compare  Overall Impression: Low risk stress nuclear study with a small sized, mild fixed mid and basal inferior defect that is most consistent with diaphragmatic attenuation and increased gut uptake. No evidence of ischemia. Of note the patient's BP was 116/39mmHg in stage 3 and decreased to 140/40mmHg in Stage 2. The next BP was in recovery at 173/70mmHg.  LV Ejection Fraction: 63%. LV Wall Motion: NL LV Function;  NL Wall Motion    Assessment / Plan:  1. NSTEMI - with nonocclusive intraluminal thrombus - managed with heparin and remains on chronic Plavix - with recurrent chest pain 3-4/week, responding to NTG. Since she haven't got a stent I will schedule an exercise nuclear stress test. Improved with ranolazine 500 mg po BID (free samples given for a trial for 8 weeks).   Repeat stress test in October 2015 performed for chest pain was negative. She currently has stable angina improved with ranolazine that she couldn't afford, Imdur gave her headaches. Asymptomatic with Imdur.  2. Acute pericarditis on 12/17/13,  Resolved with prednisone taper, finished 2 months course of colchicine.   3. LE Swelling - resolved.  4. HLD - labs from last month reviewed with her. Liver enzymes normal in October.  4. Elevated HbA1c - repeat 5.9%  Follow up in 6 months with CMP and lipids.  Dorothy Spark 10/27/2014

## 2014-11-28 ENCOUNTER — Telehealth: Payer: Self-pay | Admitting: Emergency Medicine

## 2014-11-28 MED ORDER — BUDESONIDE-FORMOTEROL FUMARATE 160-4.5 MCG/ACT IN AERO: 2.0000 | INHALATION_SPRAY | Freq: Two times a day (BID) | RESPIRATORY_TRACT | Status: DC

## 2014-11-28 NOTE — Telephone Encounter (Signed)
1 sample of Symbicort left at front for patient to pick up. Patient notified that sample has been left up front. Patient scheduled for follow up appointment with Dr. Lamonte Sakai. Patient notified of appointment. Nothing further needed. Closing encounter

## 2014-12-07 ENCOUNTER — Other Ambulatory Visit: Payer: Self-pay | Admitting: Cardiology

## 2014-12-28 ENCOUNTER — Ambulatory Visit: Payer: Self-pay | Admitting: Emergency Medicine

## 2014-12-30 ENCOUNTER — Telehealth: Payer: Self-pay | Admitting: Emergency Medicine

## 2014-12-30 NOTE — Telephone Encounter (Signed)
Called spoke with pt and made aware no samples at this time. Nothing further needed

## 2015-01-07 ENCOUNTER — Other Ambulatory Visit: Payer: Self-pay | Admitting: Cardiology

## 2015-01-13 ENCOUNTER — Telehealth: Payer: Self-pay | Admitting: Emergency Medicine

## 2015-01-13 NOTE — Telephone Encounter (Signed)
Called spoke with pt. Aware 1 sample left for pick up. Nothing further needed

## 2015-02-14 ENCOUNTER — Telehealth: Payer: Self-pay | Admitting: *Deleted

## 2015-02-19 ENCOUNTER — Emergency Department (HOSPITAL_COMMUNITY): Payer: Self-pay

## 2015-02-19 ENCOUNTER — Encounter (HOSPITAL_COMMUNITY): Payer: Self-pay | Admitting: Emergency Medicine

## 2015-02-19 ENCOUNTER — Emergency Department (HOSPITAL_COMMUNITY)
Admission: EM | Admit: 2015-02-19 | Discharge: 2015-02-19 | Disposition: A | Discharge status: Home / Self Care | Payer: Self-pay | Attending: Emergency Medicine | Admitting: Emergency Medicine

## 2015-02-19 DIAGNOSIS — R200 Anesthesia of skin: Secondary | ICD-10-CM | POA: Insufficient documentation

## 2015-02-19 DIAGNOSIS — Z8619 Personal history of other infectious and parasitic diseases: Secondary | ICD-10-CM | POA: Insufficient documentation

## 2015-02-19 DIAGNOSIS — R4789 Other speech disturbances: Secondary | ICD-10-CM | POA: Insufficient documentation

## 2015-02-19 DIAGNOSIS — Z87891 Personal history of nicotine dependence: Secondary | ICD-10-CM | POA: Insufficient documentation

## 2015-02-19 DIAGNOSIS — I252 Old myocardial infarction: Secondary | ICD-10-CM | POA: Insufficient documentation

## 2015-02-19 DIAGNOSIS — Z7902 Long term (current) use of antithrombotics/antiplatelets: Secondary | ICD-10-CM | POA: Insufficient documentation

## 2015-02-19 DIAGNOSIS — I2510 Atherosclerotic heart disease of native coronary artery without angina pectoris: Secondary | ICD-10-CM | POA: Insufficient documentation

## 2015-02-19 DIAGNOSIS — Z79899 Other long term (current) drug therapy: Secondary | ICD-10-CM | POA: Insufficient documentation

## 2015-02-19 DIAGNOSIS — R479 Unspecified speech disturbances: Secondary | ICD-10-CM

## 2015-02-19 DIAGNOSIS — Z9889 Other specified postprocedural states: Secondary | ICD-10-CM | POA: Insufficient documentation

## 2015-02-19 DIAGNOSIS — R0789 Other chest pain: Secondary | ICD-10-CM | POA: Insufficient documentation

## 2015-02-19 DIAGNOSIS — Z8742 Personal history of other diseases of the female genital tract: Secondary | ICD-10-CM | POA: Insufficient documentation

## 2015-02-19 DIAGNOSIS — Z862 Personal history of diseases of the blood and blood-forming organs and certain disorders involving the immune mechanism: Secondary | ICD-10-CM | POA: Insufficient documentation

## 2015-02-19 DIAGNOSIS — Z91040 Latex allergy status: Secondary | ICD-10-CM | POA: Insufficient documentation

## 2015-02-19 DIAGNOSIS — Z7951 Long term (current) use of inhaled steroids: Secondary | ICD-10-CM | POA: Insufficient documentation

## 2015-02-19 DIAGNOSIS — Z8659 Personal history of other mental and behavioral disorders: Secondary | ICD-10-CM | POA: Insufficient documentation

## 2015-02-19 DIAGNOSIS — Z7982 Long term (current) use of aspirin: Secondary | ICD-10-CM | POA: Insufficient documentation

## 2015-02-19 LAB — BASIC METABOLIC PANEL
Anion gap: 10 (ref 5–15)
BUN: 5 mg/dL — AB (ref 6–20)
CALCIUM: 9.4 mg/dL (ref 8.9–10.3)
CO2: 28 mmol/L (ref 22–32)
Chloride: 104 mmol/L (ref 101–111)
Creatinine, Ser: 0.97 mg/dL (ref 0.44–1.00)
GFR calc Af Amer: >60 mL/min (ref >60)
GLUCOSE: 139 mg/dL — AB (ref 65–99)
Potassium: 3.4 mmol/L — ABNORMAL LOW (ref 3.5–5.1)
Sodium: 142 mmol/L (ref 135–145)

## 2015-02-19 LAB — CBC
HCT: 39.4 % (ref 36.0–46.0)
HEMOGLOBIN: 13 g/dL (ref 12.0–15.0)
MCH: 27.8 pg (ref 26.0–34.0)
MCHC: 33 g/dL (ref 30.0–36.0)
MCV: 84.4 fL (ref 78.0–100.0)
Platelets: 279 10*3/uL (ref 150–400)
RBC: 4.67 MIL/uL (ref 3.87–5.11)
RDW: 13.4 % (ref 11.5–15.5)
WBC: 6.4 10*3/uL (ref 4.0–10.5)

## 2015-02-19 LAB — I-STAT TROPONIN, ED
TROPONIN I, POC: 0 ng/mL (ref 0.00–0.08)
Troponin i, poc: 0.01 ng/mL (ref 0.00–0.08)

## 2015-02-19 NOTE — ED Provider Notes (Signed)
CSN: [id number]     Arrival date & time 02/19/15  1245 History   First MD Initiated Contact with Patient 02/19/15 1522     Chief Complaint  Patient presents with  . Chest Pain   HPI  Patient presents with concern of chest pain, speech difficulty, left arm heaviness. Symptoms began 3 days ago, with mild left chest pain. Symptoms improved with nitroglycerin, but over the following 2 days, she has had multiple recurrences of discomfort in the same area. Discomfort is sore, heavy. Today, the pain was different, with left arm heaviness, numbness, and subsequent, weakness in the left hand. This occurred after the patient had a period of speech difficulty. Speech difficulty resolved after a few moments, but the left arm dysesthesia was persistent for several hours. On my evaluation, symptoms are gone, aside from minimal left upper arm fullness. Patient has a notable history of prior MI. She takes aspirin, Plavix, as directed. No history of stroke. Patient is a nonsmoker.   Past Medical History  Diagnosis Date  . Abnormal Pap smear     Colpo (twice)  . Anemia   . Chlamydia     years ago  . Trichomonas     years ago  . Breast disorder     soreness in left breast   . Depression   . CAD (coronary artery disease)     a. 01/2013 NSTEMI/Cath: LM nl, LAD 86m, LCX nl, RI nl, RCA nl, PDA ostial nonocclusive intraluminal thrombus, EF 65%, Treated with heparin x 48 hrs->Med Rx (occurred in setting of + cocaine);  b. 01/2013 Echo: EF 60-65%, nl wall motion.  . Polysubstance abuse     tobacco/cocaine/marijuana (+ UDS 01/2013).  Marland Kitchen Heart disease     MI Jan 2015  . Anxiety and depression 01/01/2014  . Myocardial infarction [facility])    Past Surgical History  Procedure Laterality Date  . Breast surgery      lump removed from right breast  . Endometrial ablation w/ novasure    . Groin mass open biopsy    . Cervical cone biopsy    . Cesarean section      2 c-sections  . Ectopic pregnancy surgery     . Left heart catheterization with coronary angiogram N/A 02/08/2013    Procedure: LEFT HEART CATHETERIZATION WITH CORONARY ANGIOGRAM;  Surgeon: Sinclair Grooms, MD;  Location: [facility] CATH LAB;  Service: Cardiovascular;  Laterality: N/A;  . Tubal ligation     Family History  Problem Relation Age of Onset  . Breast cancer Mother   . Hypertension Brother   . Kidney failure Brother   . Hypertension Sister    Social History  Substance Use Topics  . Smoking status: Former Smoker -- 0.50 packs/day for 31 years    Types: Cigarettes    Quit date: 02/05/2013  . Smokeless tobacco: Never Used  . Alcohol Use: 0.0 oz/week    0 Standard drinks or equivalent per week     Comment: rarely   OB History    Gravida Para Term Preterm AB TAB SAB Ectopic Multiple Living   4 2 2  2  1 1  2      Review of Systems  Constitutional:       Per HPI, otherwise negative  HENT:       Per HPI, otherwise negative  Respiratory:       Per HPI, otherwise negative  Cardiovascular:       Per HPI, otherwise negative  Gastrointestinal:  Negative for vomiting.  Endocrine:       Negative aside from HPI  Genitourinary:       Neg aside from HPI   Musculoskeletal:       Per HPI, otherwise negative  Skin: Negative.   Neurological: Positive for speech difficulty and numbness. Negative for syncope and weakness.      Allergies  Demerol; Latex; and Novocain  Home Medications   Prior to Admission medications   Medication Sig Start Date End Date Taking? Authorizing Provider  acetaminophen (TYLENOL) 500 MG tablet Take 1,000 mg by mouth every 6 (six) hours as needed for mild pain.   Yes Historical Provider, MD  albuterol (PROAIR HFA) 108 (90 BASE) MCG/ACT inhaler Inhale 2 puffs into the lungs every 4 (four) hours as needed for wheezing or shortness of breath. 06/03/13  Yes Collene Gobble, MD  aspirin EC 81 MG EC tablet Take 1 tablet (81 mg total) by mouth daily. 02/12/13  Yes Rogelia Mire, NP   budesonide-formoterol [facility]) 160-4.5 MCG/ACT inhaler Inhale 2 puffs into the lungs 2 (two) times daily. 11/28/14  Yes Collene Gobble, MD  carvedilol (COREG) 3.125 MG tablet TAKE ONE TABLET BY MOUTH TWICE DAILY WITH MEALS 01/09/15  Yes Dorothy Spark, MD  clopidogrel (PLAVIX) 75 MG tablet Take 1 tablet (75 mg total) by mouth daily with breakfast. 10/18/13  Yes Dorothy Spark, MD  furosemide (LASIX) 20 MG tablet Take 20 mg by mouth daily as needed for fluid or edema.    Yes Historical Provider, MD  isosorbide mononitrate (IMDUR) 30 MG 24 hr tablet Take 0.5 tablets (15 mg total) by mouth daily. Patient taking differently: Take 30 mg by mouth daily.  03/23/14  Yes Dorothy Spark, MD  loratadine (CLARITIN) 10 MG tablet Take 10 mg by mouth daily.   Yes Historical Provider, MD  nitroGLYCERIN (NITROSTAT) 0.4 MG SL tablet Place 1 tablet (0.4 mg total) under the tongue every 5 (five) minutes x 3 doses as needed for chest pain. 06/05/14  Yes Rosemarie Ax, MD  pravastatin (PRAVACHOL) 40 MG tablet Take 1 tablet (40 mg total) by mouth daily. Patient taking differently: Take 40 mg by mouth at bedtime.  10/12/14  Yes Dorothy Spark, MD  ondansetron (ZOFRAN ODT) 8 MG disintegrating tablet Take 1 tablet (8 mg total) by mouth every 8 (eight) hours as needed for nausea. Patient not taking: Reported on 02/19/2015 06/23/14   Tamela Oddi Hess, DO   BP 119/68 mmHg  Pulse 91  Temp(Src) 98.9 F (37.2 C) (Oral)  Resp 20  Ht 5\' 3"  (1.6 m)  Wt 130 lb (58.968 kg)  BMI 23.03 kg/m2  SpO2 99% Physical Exam  Constitutional: She is oriented to person, place, and time. She appears well-developed and well-nourished. No distress.  HENT:  Head: Normocephalic and atraumatic.  Eyes: Conjunctivae and EOM are normal.  Cardiovascular: Normal rate and regular rhythm.   Pulmonary/Chest: Effort normal and breath sounds normal. No stridor. No respiratory distress.  Abdominal: She exhibits no distension.  Musculoskeletal:  She exhibits no edema.  Neurological: She is alert and oriented to person, place, and time. She displays no atrophy and no tremor. No cranial nerve deficit or sensory deficit. She exhibits normal muscle tone. She displays no seizure activity. Coordination normal.  Skin: Skin is warm and dry.  Psychiatric: She has a normal mood and affect.  Nursing note and vitals reviewed.   ED Course  Procedures (including critical care time) Labs Review  Labs Reviewed  BASIC METABOLIC PANEL - Abnormal; Notable for the following:    Potassium 3.4 (*)    Glucose, Bld 139 (*)    BUN 5 (*)    All other components within normal limits  CBC  I-STAT TROPOININ, ED  Randolm Idol, ED    Imaging Review Dg Chest 2 View  02/19/2015  CLINICAL DATA:  Left-sided chest pain and left arm numbness. History of myocardial infarction. EXAM: CHEST - 2 VIEW COMPARISON:  05/25/2014 FINDINGS: The heart size and mediastinal contours are within normal limits. There is no evidence of pulmonary edema, consolidation, pneumothorax, nodule or pleural fluid. The visualized skeletal structures are unremarkable. IMPRESSION: No active disease. Electronically Signed   By: Aletta Edouard M.D.   On: 02/19/2015 14:11   Ct Head Wo Contrast  02/19/2015  CLINICAL DATA:  Slurred speech.  Left-sided weakness. EXAM: CT HEAD WITHOUT CONTRAST TECHNIQUE: Contiguous axial images were obtained from the base of the skull through the vertex without intravenous contrast. COMPARISON:  07/28/2007 FINDINGS: Brain: No evidence of acute infarction, hemorrhage, extra-axial collection, ventriculomegaly, or mass effect. 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 Vascular: No hyperdense vessel or unexpected calcification. Skull: Negative for fracture or focal lesion. Sinuses/Orbits: No acute findings. Other: None. IMPRESSION: No acute intracranial abnormality. Electronically Signed   By: Fidela Salisbury  M.D.   On: 02/19/2015 16:59   I have personally reviewed and evaluated these images and lab results as part of my medical decision-making.   EKG Interpretation   Date/Time:  Sunday February 19 2015 13:25:03 EST Ventricular Rate:  79 PR Interval:  132 QRS Duration: 72 QT Interval:  366 QTC Calculation: 419 R Axis:   21 Text Interpretation:  Normal sinus rhythm Nonspecific T wave abnormality  Abnormal ECG Sinus rhythm T wave abnormality Abnormal ekg Confirmed by  Carmin Muskrat  MD ([id number]) on 02/19/2015 3:23:56 PM     5:30 PM Patient in no distress, smiling. We discussed all findings at length, and the need to follow up with primary care and cardiology, given her history. With no ongoing complaints, she was prepared for discharge with further evaluation, management as an outpatient. MDM   Final diagnoses:  Atypical chest pain  Speech abnormality   Young female with history of MI, but compliant with medication, including aspirin, Plavix presents with slightly atypical chest pain. Here, no evidence for distress.  There is no objective neurologic deficit, but given the patient's history of speech difficulty earlier today, subjective left arm changes, CT scan was performed in addition to labs, x-ray. CT reassuring, and there is no objective neurologic deficit. Patient is already taking Plavix, aspirin, appropriate stroke prophylaxis. However, the patient was encouraged to discuss this with her primary care physician as well. With reassuring results, patient appropriate for discharge with outpatient follow-up.    Carmin Muskrat, MD 02/19/15 [phone]

## 2015-02-19 NOTE — Discharge Instructions (Signed)
As discussed, today's evaluation has been largely reassuring. Today, there is no evidence for stroke, nor heart attack. However, with your medical problems, it is very important to follow-up with both your primary care physician and your cardiologist.  Return here for concerning changes in your condition.

## 2015-02-19 NOTE — ED Notes (Signed)
Pt reports history of NSTEMI, c/o chest pain onset Wednesday. Pt called EMS today and they told her to come to the ED to get her troponin checked.

## 2015-02-19 NOTE — ED Notes (Signed)
Pt began experiencing chest pain Wednesday. Pt took four nitro in past 24 hours with relief of chest pain. Pt had episode where she thought her speech sounded funny during the chest pain. Pt also c.o. Left arm weakness. No pain at present. Symptoms come and go. Pt also reports she felt like her coordination was off.

## 2015-03-01 ENCOUNTER — Other Ambulatory Visit (HOSPITAL_COMMUNITY)
Admission: RE | Admit: 2015-03-01 | Discharge: 2015-03-01 | Disposition: A | Discharge status: Home / Self Care | Payer: BLUE CROSS/BLUE SHIELD | Source: Ambulatory Visit | Attending: Family Medicine | Admitting: Family Medicine

## 2015-03-01 ENCOUNTER — Encounter: Payer: Self-pay | Admitting: Family Medicine

## 2015-03-01 ENCOUNTER — Ambulatory Visit (INDEPENDENT_AMBULATORY_CARE_PROVIDER_SITE_OTHER): Payer: BLUE CROSS/BLUE SHIELD | Admitting: Family Medicine

## 2015-03-01 VITALS — BP 108/65 | HR 88 | Temp 98.6°F | Wt 129.7 lb

## 2015-03-01 DIAGNOSIS — Z114 Encounter for screening for human immunodeficiency virus [HIV]: Secondary | ICD-10-CM

## 2015-03-01 DIAGNOSIS — Z711 Person with feared health complaint in whom no diagnosis is made: Secondary | ICD-10-CM

## 2015-03-01 DIAGNOSIS — R0789 Other chest pain: Secondary | ICD-10-CM

## 2015-03-01 DIAGNOSIS — E0590 Thyrotoxicosis, unspecified without thyrotoxic crisis or storm: Secondary | ICD-10-CM

## 2015-03-01 DIAGNOSIS — H66001 Acute suppurative otitis media without spontaneous rupture of ear drum, right ear: Secondary | ICD-10-CM | POA: Diagnosis not present

## 2015-03-01 DIAGNOSIS — Z113 Encounter for screening for infections with a predominantly sexual mode of transmission: Secondary | ICD-10-CM | POA: Insufficient documentation

## 2015-03-01 DIAGNOSIS — R7309 Other abnormal glucose: Secondary | ICD-10-CM | POA: Diagnosis not present

## 2015-03-01 DIAGNOSIS — H66009 Acute suppurative otitis media without spontaneous rupture of ear drum, unspecified ear: Secondary | ICD-10-CM | POA: Insufficient documentation

## 2015-03-01 DIAGNOSIS — G459 Transient cerebral ischemic attack, unspecified: Secondary | ICD-10-CM | POA: Insufficient documentation

## 2015-03-01 LAB — POCT URINALYSIS DIPSTICK
BILIRUBIN UA: NEGATIVE
Blood, UA: NEGATIVE
GLUCOSE UA: NEGATIVE
KETONES UA: NEGATIVE
LEUKOCYTES UA: NEGATIVE
Nitrite, UA: NEGATIVE
PH UA: 6.5
Protein, UA: NEGATIVE
Spec Grav, UA: 1.025
Urobilinogen, UA: 0.2

## 2015-03-01 LAB — POCT GLYCOSYLATED HEMOGLOBIN (HGB A1C): Hemoglobin A1C: 5.5

## 2015-03-01 LAB — TSH: TSH: 0.232 u[IU]/mL — AB (ref 0.350–4.500)

## 2015-03-01 LAB — T3, FREE: T3 FREE: 3.1 pg/mL (ref 2.3–4.2)

## 2015-03-01 LAB — T4, FREE: Free T4: 1.02 ng/dL (ref 0.80–1.80)

## 2015-03-01 MED ORDER — AMOXICILLIN 500 MG PO CAPS: 500.0000 mg | ORAL_CAPSULE | Freq: Two times a day (BID) | ORAL | Status: DC

## 2015-03-01 NOTE — Assessment & Plan Note (Signed)
Recent evaluation in the ED and came to the conclusion that she had atypical chest pain.  - emailed her Cardiologist who recommended continued asa and Plavix therapy

## 2015-03-01 NOTE — Assessment & Plan Note (Signed)
Reports her former partner of 5 years told her that he had some concerns  - HIV, GC/chlamydia  - UA

## 2015-03-01 NOTE — Assessment & Plan Note (Signed)
Left ear membrane concerning for otitis  Bump under left ear may be lymphadenopathy  She failed hearing but bilateral  - try amoxicillin  - she will follow up in two weeks, if still fails bilateral hearing then refer to ENT. If her right ear just fails then refer to audiology.  - discussed with Dr. Mingo Amber.

## 2015-03-01 NOTE — Assessment & Plan Note (Signed)
She is on beta blocker for her history of MI  Doesn't report any specific symptoms.  - TSH, Free t3, free t4

## 2015-03-01 NOTE — Patient Instructions (Signed)
Thank you for coming in,   I will call or send a letter with the results from today.   I have sent in amoxicillin for your ear problem. This is an antibiotic.  Please make an appointment with me and 2-3 weeks to recheck ear hearing.  Please bring all of your medications with you to each visit.   Sign up for My Chart to have easy access to your labs results, and communication with your Primary care physician   Please feel free to call with any questions or concerns at any time, at [phone]. --Dr. Raeford Razor

## 2015-03-01 NOTE — Progress Notes (Signed)
Subjective:    [name] [name] - 50 y.o. female MRN [id number]  Date of birth: [birth year]  HPI  [name] [name] is here for ED follow up.   Atypical Chest pain/Concern for stroke:  Was evaluated in the ED.  All findings were negative during this time.  She bought some gas-x and seemed to help.  No more chest pain. Was using the bathroom Sunday morning and had some of the same symptoms when she had an MI.  Had diaphoresis, left arm numbness and dysarthria.  She took nitro and 4 ASA and her problems cleared up. These symptoms lasted less than 60 minutes.   Hearing changes:  She developed a knot under her left ear lobe about two weeks ago  She had some hearing changes after this.  Her hearing has been worse for the past week.  It feels fuller  Has some tinnitus.  Denies any pain or drainage. She hasn't fevers or chills   Elevated A1c  She was found to have an elevated a1c of 6.0  Not eating or exercising different.  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   Hyperthyroidism.  Reports no symptoms  Takes beta blocker for her CAD   Concern for STI Reports her former partner was having some discomfort.  No vaginal discharge.  No itching  Had some itching about 5 months ago but used monostat kid and symptoms resolved.  Has had one partner last 5 years  Health Maintenance:  - HIV screening today  Health Maintenance Due  Topic Date Due  . HIV Screening  02/03/1982    -  reports that she quit smoking about 2 years ago. Her smoking use included Cigarettes. She has a 15.5 pack-year smoking history. She has never used smokeless tobacco. - Review of Systems: Per HPI. - Past Medical History: Patient Active Problem List   Diagnosis Date Noted  . Elevated hemoglobin A1c 03/01/2015  . Concern about STD in female without diagnosis 03/01/2015  . Acute purulent otitis media 03/01/2015  . Atypical chest pain 03/01/2015  . TIA (transient ischemic attack) 03/01/2015  . Stable angina (HCC)     . Essential hypertension   . Asthma, chronic   . Anxiety and depression 01/01/2014  . COPD (chronic obstructive pulmonary disease) ([place]) 06/03/2013  . Hyperlipidemia 02/11/2013  . CAD (coronary artery disease) 02/11/2013  . Tobacco abuse 02/10/2013  . Polysubstance abuse:  Cocaine, marijuana 02/10/2013  . Subclinical hyperthyroidism 02/10/2013  . Breast lump on right side at 9 o'clock position 10/13/2012  . Breast lump on left side at 3 o'clock position 06/09/2012   - Medications: reviewed and updated Current Outpatient Prescriptions  Medication Sig Dispense Refill  . acetaminophen (TYLENOL) 500 MG tablet Take 1,000 mg by mouth every 6 (six) hours as needed for mild pain.    Marland Kitchen albuterol (PROAIR HFA) 108 (90 BASE) MCG/ACT inhaler Inhale 2 puffs into the lungs every 4 (four) hours as needed for wheezing or shortness of breath. 1 Inhaler 11  . amoxicillin (AMOXIL) 500 MG capsule Take 1 capsule (500 mg total) by mouth 2 (two) times daily. For a total of 7 days. 14 capsule 0  . aspirin EC 81 MG EC tablet Take 1 tablet (81 mg total) by mouth daily.    . budesonide-formoterol (SYMBICORT) 160-4.5 MCG/ACT inhaler Inhale 2 puffs into the lungs 2 (two) times daily. 1 Inhaler 0  . carvedilol (COREG) 3.125 MG tablet TAKE ONE TABLET BY MOUTH TWICE DAILY WITH MEALS 60 tablet 8  .  clopidogrel (PLAVIX) 75 MG tablet Take 1 tablet (75 mg total) by mouth daily with breakfast. 30 tablet 1  . furosemide (LASIX) 20 MG tablet Take 20 mg by mouth daily as needed for fluid or edema.     . isosorbide mononitrate (IMDUR) 30 MG 24 hr tablet Take 0.5 tablets (15 mg total) by mouth daily. (Patient taking differently: Take 30 mg by mouth daily. ) 90 tablet 3  . loratadine (CLARITIN) 10 MG tablet Take 10 mg by mouth daily.    . nitroGLYCERIN (NITROSTAT) 0.4 MG SL tablet Place 1 tablet (0.4 mg total) under the tongue every 5 (five) minutes x 3 doses as needed for chest pain. 25 tablet 3  . ondansetron (ZOFRAN ODT) 8 MG  disintegrating tablet Take 1 tablet (8 mg total) by mouth every 8 (eight) hours as needed for nausea. (Patient not taking: Reported on 02/19/2015) 20 tablet 0  . pravastatin (PRAVACHOL) 40 MG tablet Take 1 tablet (40 mg total) by mouth daily. (Patient taking differently: Take 40 mg by mouth at bedtime. ) 30 tablet 5   No current facility-administered medications for this visit.     Review of Systems See HPI     Objective:   Physical Exam BP 108/65 mmHg  Pulse 88  Temp(Src) 98.6 F (37 C) (Oral)  Wt 129 lb 11.2 oz (58.832 kg) Gen: NAD, alert, cooperative with exam,  HEENT: NCAT,EOMI, clear conjunctiva, oropharynx clear, supple neck,  No LAD, no pain with palpation of tragus or right lobe, raised circular bump just inferior to left ear lobe, Right tympanic membrane retracted with some pus behind it, left membrane clear and intact, no LAD CV: RRR, good S1/S2, no murmur, no edema, capillary refill brisk  Resp: CTABL, no wheezes, non-labored Abd: SNTND, BS present, no guarding or organomegaly Skin: no rashes, normal turgor  GU: External: no lesions, Vagina: no blood in vault, Cervix: no lesion; no mucopurulent d/c Neuro: no gross deficits.  Psych: good insight, alert and oriented     Assessment & Plan:   Subclinical hyperthyroidism She is on beta blocker for her history of MI  Doesn't report any specific symptoms.  - TSH, Free t3, free t4  Elevated hemoglobin A1c A1c was 6.0 but now 5.4  Denies any exercise or diet  - monitor as needed.   Concern about STD in female without diagnosis Reports her former partner of 5 years told her that he had some concerns  - HIV, GC/chlamydia  - UA   Acute purulent otitis media Left ear membrane concerning for otitis  Bump under left ear may be lymphadenopathy  She failed hearing but bilateral  - try amoxicillin  - she will follow up in two weeks, if still fails bilateral hearing then refer to ENT. If her right ear just fails then refer to  audiology.  - discussed with Dr. Mingo Amber.   Atypical chest pain Recent evaluation in the ED and came to the conclusion that she had atypical chest pain.  - emailed her Cardiologist who recommended continued asa and Plavix therapy     TIA (transient ischemic attack) Symptoms at ED were also concerning for TIA.  ABCD2 score of 2.  She is on full anti-platelet therapy already due to her CVA disease.  ECHO 2015 showing no PFO  No neurological deficits today  - Consider MRI/MRA when she follows up in two weeks. It won't change our management  - discussed with Dr. Mingo Amber.

## 2015-03-01 NOTE — Assessment & Plan Note (Signed)
A1c was 6.0 but now 5.4  Denies any exercise or diet  - monitor as needed.

## 2015-03-01 NOTE — Assessment & Plan Note (Signed)
Symptoms at ED were also concerning for TIA.  ABCD2 score of 2.  She is on full anti-platelet therapy already due to her CVA disease.  ECHO 2015 showing no PFO  No neurological deficits today  - Consider MRI/MRA when she follows up in two weeks. It won't change our management  - discussed with Dr. Mingo Amber.

## 2015-03-02 ENCOUNTER — Telehealth: Payer: Self-pay | Admitting: Family Medicine

## 2015-03-02 LAB — HIV ANTIBODY (ROUTINE TESTING W REFLEX): HIV 1&2 Ab, 4th Generation: NONREACTIVE

## 2015-03-02 LAB — CERVICOVAGINAL ANCILLARY ONLY
CHLAMYDIA, DNA PROBE: NEGATIVE
Neisseria Gonorrhea: NEGATIVE

## 2015-03-02 NOTE — Telephone Encounter (Signed)
Will forward to md.  Jazmin Hartsell,CMA  

## 2015-03-02 NOTE — Telephone Encounter (Signed)
Pt is calling to get her lab results. jw °

## 2015-03-03 ENCOUNTER — Ambulatory Visit (INDEPENDENT_AMBULATORY_CARE_PROVIDER_SITE_OTHER): Payer: BLUE CROSS/BLUE SHIELD | Admitting: Emergency Medicine

## 2015-03-03 ENCOUNTER — Encounter: Payer: Self-pay | Admitting: Emergency Medicine

## 2015-03-03 VITALS — BP 96/62 | HR 94 | Ht 63.0 in | Wt 129.6 lb

## 2015-03-03 DIAGNOSIS — R042 Hemoptysis: Secondary | ICD-10-CM

## 2015-03-03 DIAGNOSIS — Z23 Encounter for immunization: Secondary | ICD-10-CM

## 2015-03-03 DIAGNOSIS — J449 Chronic obstructive pulmonary disease, unspecified: Secondary | ICD-10-CM

## 2015-03-03 NOTE — Telephone Encounter (Signed)
Left VM for patient. If she calls back please have her speak with a nurse/CMA and inform that Gc/chlaymydia, HIV and UA are normal. Her thyroid studies are still showing the same thyroid function.   If any questions then please take the best time and phone number to call and I will try to call her back.   Rosemarie Ax, MD PGY-3, [facility] 03/03/2015, 8:25 AM

## 2015-03-03 NOTE — Telephone Encounter (Signed)
Patient informed of message from MD, expressed understanding. 

## 2015-03-03 NOTE — Progress Notes (Signed)
Subjective:    Patient ID: [name] [name], female    DOB: 10-Jun-1965, 50 y.o.   MRN: [id number]  HPI 50 yo woman, former smoker (20 pk-yrs), CAD, depression. She is referred by Dr Meda Coffee for dyspnea. She was admitted with a NSTEMI in 1/15, c/b VT. Cath showed a non-occlusive thrombus, no PCI done. He sx at the time were arm pain. Since then she has experienced exertional SOB.  No cough. Rare wheeze with exertion. She has allergies, loratadine not helping her.   ROV 07/05/13 -- returns for f/u dyspnea. Last time we started symbicort based on AFL + BD response. Added nasal steroid to loratadine. a1-AT was MM. She feels that the symbicort has helped her. Able to do more exertion, still has some DOE with certain activities. She does respond to albuterol - uses less than once a day. She tried the nasonex, has used prn.   ROV 02/04/14 -- follow-up visit for obstructive lung disease with a bronchodilator response on spirometry. She is doing well on Symbicort scheduled. Uses her albuterol rarely. She is staying active, likes to bowl, works full time. No flares. Hasn't had the flu shot shot yet.   ROV 03/03/15 -- follow-up visit for COPD with asthmatic component. She has been doing well for the last year, reports no flares. She does have daily cough, dark mucous, usually when brushing her teeth. She has sometimes seen dark red in her mucous - has been true for months. ? Hemoptysis. She doesn't see any nasal blood or congestion. She is off nasal steroid.   She is currently on amox for an ear infection.    Review of Systems  Constitutional: Negative for fever and unexpected weight change.  HENT: Negative for congestion, dental problem, ear pain, nosebleeds, postnasal drip, rhinorrhea, sinus pressure, sneezing, sore throat and trouble swallowing.   Eyes: Negative for redness and itching.  Respiratory: Positive for cough. Negative for chest tightness, shortness of breath and wheezing.   Cardiovascular: Negative for  palpitations and leg swelling.  Gastrointestinal: Negative for nausea and vomiting.  Genitourinary: Negative for dysuria.  Musculoskeletal: Negative for joint swelling.       Pain and stiffness  Skin: Negative for rash.  Neurological: Negative for headaches.  Hematological: Negative for adenopathy. Bruises/bleeds easily.  Psychiatric/Behavioral: Negative for dysphoric mood. The patient is not nervous/anxious.       Objective:   Physical Exam Filed Vitals:   03/03/15 1552  BP: 96/62  Pulse: 94  Height: 5\' 3"  (1.6 m)  Weight: 129 lb 9.6 oz (58.786 kg)  SpO2: 99%   Gen: Pleasant, well-nourished, in no distress,  normal affect  ENT: No lesions,  mouth clear,  oropharynx clear, no postnasal drip  Neck: No JVD, no TMG, no carotid bruits  Lungs: No use of accessory muscles, clear without rales or rhonchi  Cardiovascular: RRR, heart sounds normal, no murmur or gallops, no peripheral edema  Musculoskeletal: No deformities, no cyanosis or clubbing  Neuro: alert, non focal  Skin: Warm, no lesions or rashes     Assessment & Plan:  COPD (chronic obstructive pulmonary disease) She has been stable, but notes that she has daily cough and some hemoptysis mixed with mucous.   Hemoptysis She is just starting amox for a week for otitis media, will follow the sputum and see if it resolves consistent w bronchitis. If not then we will consider imaging w CT and also a bronchoscopy. I will see her back in a month to assess.

## 2015-03-03 NOTE — Assessment & Plan Note (Signed)
She has been stable, but notes that she has daily cough and some hemoptysis mixed with mucous.

## 2015-03-03 NOTE — Assessment & Plan Note (Signed)
She is just starting amox for a week for otitis media, will follow the sputum and see if it resolves consistent w bronchitis. If not then we will consider imaging w CT and also a bronchoscopy. I will see her back in a month to assess.

## 2015-03-03 NOTE — Patient Instructions (Signed)
Please start and complete amoxicillin as planned Continue Symbicort 2 puffs twice a day Follow with Dr Lamonte Sakai next available to review your symptoms after the antibiotics. Depending on whether you're still seeing blood in your mucus we may decide to do other studies.

## 2015-03-09 NOTE — Telephone Encounter (Signed)
Encounter opened in error

## 2015-03-10 ENCOUNTER — Telehealth: Payer: Self-pay | Admitting: Family Medicine

## 2015-03-10 NOTE — Telephone Encounter (Signed)
Pt wants to know if she was tested for syphillis in her labs last week.  A partner told her yesterday he had tested positive for it. She is very concerned and would liked to be called asap.

## 2015-03-13 ENCOUNTER — Ambulatory Visit (INDEPENDENT_AMBULATORY_CARE_PROVIDER_SITE_OTHER): Payer: BLUE CROSS/BLUE SHIELD | Admitting: Family Medicine

## 2015-03-13 VITALS — BP 113/81 | HR 89 | Temp 98.4°F | Wt 129.4 lb

## 2015-03-13 DIAGNOSIS — H65191 Other acute nonsuppurative otitis media, right ear: Secondary | ICD-10-CM | POA: Diagnosis not present

## 2015-03-13 DIAGNOSIS — Z113 Encounter for screening for infections with a predominantly sexual mode of transmission: Secondary | ICD-10-CM | POA: Diagnosis not present

## 2015-03-13 NOTE — Patient Instructions (Signed)
We will check you for syphilis today. This result takes 1-2 days to come back.  Your hearing was normal today.  Please come back to see Dr Raeford Razor soon.  Take care,  Dr [name]

## 2015-03-13 NOTE — Telephone Encounter (Signed)
Pt states that she is returning a phone call from just a little while ago.  Her blue tooth was not connected at the time so she missed the call.  Advised that I did not see a message so I would send to MD.  Pt agreeable. Fleeger, Salome Spotted

## 2015-03-13 NOTE — Progress Notes (Signed)
    Subjective:  [name] [name] is a 50 y.o. female who presents to the [facility] today for same day appointment with a chief complaint of STD screening and otitis media follow up.   HPI:  STD Screening Wants to be tested for syphilis today. Last sexually active 7 months ago. That partner told her that he had syphilis last month. No fevers or chills. No sores or rashes. Tested negative for GC/CT and HIV last visit.   Otitis Media Follow up / Hearing Loss Patient diagnosed with right sided AOM 2 weeks ago. Also failed hearing exam bilaterally at that time. Was started on amoxicillin. No fevers, chills, or discharge.   ROS: Per HPI  Objective:  Physical Exam: There were no vitals taken for this visit.  Gen: NAD, resting comfortably HEENT: Left TM clear. Right TM erythematous with small amount of effusion. No purulence.  Skin: No rashes.  Hearing Screen: Passed bilaterally.   Assessment/Plan:  STD Screening Will check RPR today. Possible exposure 7 months ago, though has not had any symptoms. Patient wishes to be called with the results as soon as they are available.  Otitis Media Resolving. Hearing screen normal today. Instructed patient to finish course of amoxicillin. Follow up as needed.   Algis Greenhouse. [name], [place] [facility] Resident PGY-2 03/13/2015 9:06 AM

## 2015-03-14 ENCOUNTER — Encounter: Payer: Self-pay | Admitting: Family Medicine

## 2015-03-14 ENCOUNTER — Telehealth: Payer: Self-pay | Admitting: Family Medicine

## 2015-03-14 LAB — RPR

## 2015-03-14 NOTE — Telephone Encounter (Signed)
Called patient to inform of results. Also requested to have printed copy of results.  [name] [name]. [name], [place] [facility] Resident PGY-2 03/14/2015 1:07 PM

## 2015-03-14 NOTE — Telephone Encounter (Signed)
Will forward to MD to see if patient was contacted by them. Jazmin Hartsell,CMA

## 2015-03-16 ENCOUNTER — Ambulatory Visit (HOSPITAL_BASED_OUTPATIENT_CLINIC_OR_DEPARTMENT_OTHER): Payer: BLUE CROSS/BLUE SHIELD

## 2015-03-16 ENCOUNTER — Other Ambulatory Visit: Payer: Self-pay

## 2015-03-16 ENCOUNTER — Ambulatory Visit: Payer: BLUE CROSS/BLUE SHIELD | Admitting: Family Medicine

## 2015-03-16 VITALS — BP 104/80 | HR 88 | Temp 98.5°F | Resp 16 | Ht 63.0 in | Wt 128.0 lb

## 2015-03-16 DIAGNOSIS — Z0000 Encounter for general adult medical examination without abnormal findings: Secondary | ICD-10-CM

## 2015-03-16 NOTE — Progress Notes (Signed)
Patient is returning as a re screen to the [employer] and is currently a [facility] patient effective 04/28/2014. Patient signed consent and went over and received handout on [facility]   Clinical Measurements: Patient is 5 ft. 3 inches, weight 127.9 lbs, BMI 22.7  Medical History: Patient has history of high cholesterol. Patient states is on Pravachol for cholesterol. Patient states does not have a history of hypertension but according to patient's chart has essential hypertension.Patient states that does not have diabetes but does have history of elevated A1C. Per patient no diagnosed history of heart failure, stroke/TIA, vascular disease or congenital heart defects but has coronary heart disease and had a heart attack January 2015 .   Blood Pressure, Self-measurement: Patient states that probably needs to get aBlood pressure machine. Per patient can not check BP due to does not have blood pressure machine.  Nutrition Assessment: Patient stated that eats 1- 2 fruits every day. Patient states she eats 4 to 5 servings of vegetables a day. Per patient states does eat 3 or more ounces or more of whole grains daily. Patient stated does eat two or more servings of fish weekly. Patient states she does drink more than 36 ounces or 450 calories of beverages with added sugars weekly. Patient states she drinks water and three glasses of sweet tea a day. Patient stated she does not watch her salt intake.  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 Patient rarely does any vigorous exercise.  Smoking Status: Patient stated that quit smoking two years ago and is not exposed to smoke.   Quality of Life Assessment: In assessing patient's quality of life she stated that out of the past 30 days that she has felt her health was good all but 7 days. Patient  also stated that in the past 30 days that her mental health was not good including stress, depression and problems with emotions for 21 days. Patient stated that struggles with depression and doctors will not give her anything. Patient did state that out of the past 30 days she felt her physical or mental health had kept her from doing her usual activities including self-care, work or recreation for 10 days..   Plan: Lab work is being done today including a lipid panel, blood glucose, and Hgb A1C. Will call lab results when they are finished. Will discuss risk reduction counseling/Health Coaching when call results, Will try get in touch with doctor to see why will not prescribe antidepressant.

## 2015-03-16 NOTE — Patient Instructions (Signed)
Discussed health assessment with patient. Will decide on programs she wants when call lab results called. Patient verbalized understanding.

## 2015-03-17 ENCOUNTER — Telehealth: Payer: Self-pay

## 2015-03-17 LAB — LIPID PANEL
CHOL/HDL RATIO: 3 ratio (ref 0.0–4.4)
Cholesterol, Total: 173 mg/dL (ref 100–199)
HDL: 57 mg/dL (ref >39)
LDL Calculated: 96 mg/dL (ref 0–99)
Triglycerides: 99 mg/dL (ref 0–149)
VLDL Cholesterol Cal: 20 mg/dL (ref 5–40)

## 2015-03-17 LAB — HEMOGLOBIN A1C
Est. average glucose Bld gHb Est-mCnc: 120 mg/dL
HEMOGLOBIN A1C: 5.8 % — AB (ref 4.8–5.6)

## 2015-03-17 NOTE — Telephone Encounter (Signed)
LAB RESULTS: Called to inform about lab work from 03/16/15 Not . I informed patient: cholesterol- 173, HDL- 57, LDL- 96, triglycerides - 99, HBG-A1C - 6.2, and BMI 22.7. Compared Labs to 2015 when first came to [facility].   HEALTH COACHING/RISK REDUCTION COUNSELING: Did risk reduction counseling/Health Coaching concerning elevated A1C to prediabetes range. Patient stated that had been eating sweets since last A1C and it has gone up .3%. Discussed watching carbohydrates and sweets. Do not eat anything with sugar except on rare occasion and then just a little bit.  NAVIGATION NEEDS ASSESSMENT AND CARE PLAN: No needs at present time.  PLAN: I  Will try approaching doctor concerning patients depression. Will call patient back in a month to discuss more health coaching concerns.Patient will control carbohydrate and sweet's intake.

## 2015-03-20 ENCOUNTER — Other Ambulatory Visit: Payer: Self-pay | Admitting: Cardiology

## 2015-03-20 ENCOUNTER — Telehealth: Payer: Self-pay | Admitting: Family Medicine

## 2015-03-20 NOTE — Telephone Encounter (Signed)
Pt called and needs a refill on her Isosorbide called in. jw °

## 2015-03-21 MED ORDER — ISOSORBIDE MONONITRATE ER 30 MG PO TB24: 15.0000 mg | ORAL_TABLET | Freq: Every day | ORAL | Status: DC

## 2015-03-21 NOTE — Telephone Encounter (Signed)
Refilled imdur.   Rosemarie Ax, MD PGY-3, [facility] 03/21/2015, 3:23 PM

## 2015-04-03 ENCOUNTER — Ambulatory Visit (HOSPITAL_COMMUNITY): Payer: BLUE CROSS/BLUE SHIELD | Attending: Cardiology

## 2015-04-03 ENCOUNTER — Other Ambulatory Visit (INDEPENDENT_AMBULATORY_CARE_PROVIDER_SITE_OTHER): Payer: BLUE CROSS/BLUE SHIELD | Admitting: *Deleted

## 2015-04-03 ENCOUNTER — Other Ambulatory Visit: Payer: Self-pay

## 2015-04-03 ENCOUNTER — Telehealth: Payer: Self-pay | Admitting: Emergency Medicine

## 2015-04-03 DIAGNOSIS — I2510 Atherosclerotic heart disease of native coronary artery without angina pectoris: Secondary | ICD-10-CM

## 2015-04-03 DIAGNOSIS — I10 Essential (primary) hypertension: Secondary | ICD-10-CM

## 2015-04-03 DIAGNOSIS — I071 Rheumatic tricuspid insufficiency: Secondary | ICD-10-CM | POA: Insufficient documentation

## 2015-04-03 DIAGNOSIS — E785 Hyperlipidemia, unspecified: Secondary | ICD-10-CM | POA: Diagnosis not present

## 2015-04-03 DIAGNOSIS — I2583 Coronary atherosclerosis due to lipid rich plaque: Secondary | ICD-10-CM

## 2015-04-03 DIAGNOSIS — I309 Acute pericarditis, unspecified: Secondary | ICD-10-CM | POA: Insufficient documentation

## 2015-04-03 DIAGNOSIS — I214 Non-ST elevation (NSTEMI) myocardial infarction: Secondary | ICD-10-CM | POA: Diagnosis not present

## 2015-04-03 LAB — COMPREHENSIVE METABOLIC PANEL
ALT: 9 U/L (ref 6–29)
AST: 14 U/L (ref 10–35)
Albumin: 4 g/dL (ref 3.6–5.1)
Alkaline Phosphatase: 101 U/L (ref 33–115)
BUN: 5 mg/dL — ABNORMAL LOW (ref 7–25)
CO2: 27 mmol/L (ref 20–31)
Calcium: 9.4 mg/dL (ref 8.6–10.2)
Chloride: 100 mmol/L (ref 98–110)
Creat: 1.01 mg/dL (ref 0.50–1.10)
Glucose, Bld: 107 mg/dL — ABNORMAL HIGH (ref 65–99)
Potassium: 3.4 mmol/L — ABNORMAL LOW (ref 3.5–5.3)
Sodium: 141 mmol/L (ref 135–146)
Total Bilirubin: 0.5 mg/dL (ref 0.2–1.2)
Total Protein: 7.7 g/dL (ref 6.1–8.1)

## 2015-04-03 LAB — LIPID PANEL
Cholesterol: 189 mg/dL (ref 125–200)
HDL: 48 mg/dL (ref >=46)
LDL Cholesterol: 121 mg/dL (ref <130)
Total CHOL/HDL Ratio: 3.9 Ratio (ref <=5.0)
Triglycerides: 98 mg/dL (ref <150)
VLDL: 20 mg/dL (ref <30)

## 2015-04-03 MED ORDER — BUDESONIDE-FORMOTEROL FUMARATE 160-4.5 MCG/ACT IN AERO: 2.0000 | INHALATION_SPRAY | Freq: Two times a day (BID) | RESPIRATORY_TRACT | Status: DC

## 2015-04-03 NOTE — Addendum Note (Signed)
Addended by: Eulis Foster on: 04/03/2015 09:25 AM   Modules accepted: Orders

## 2015-04-03 NOTE — Telephone Encounter (Signed)
Spoke with pt, she wishes to cancel appt for 3/9.  This has been done. Pt also needs refill on symbicort, this has been sent. Nothing further needed.

## 2015-04-06 ENCOUNTER — Ambulatory Visit: Payer: BLUE CROSS/BLUE SHIELD | Admitting: Emergency Medicine

## 2015-04-26 ENCOUNTER — Encounter: Payer: Self-pay | Admitting: Cardiology

## 2015-04-26 ENCOUNTER — Ambulatory Visit (INDEPENDENT_AMBULATORY_CARE_PROVIDER_SITE_OTHER): Payer: BLUE CROSS/BLUE SHIELD | Admitting: Cardiology

## 2015-04-26 VITALS — BP 98/64 | HR 94 | Ht 63.0 in | Wt 128.0 lb

## 2015-04-26 DIAGNOSIS — I2510 Atherosclerotic heart disease of native coronary artery without angina pectoris: Secondary | ICD-10-CM

## 2015-04-26 DIAGNOSIS — I2583 Coronary atherosclerosis due to lipid rich plaque: Secondary | ICD-10-CM

## 2015-04-26 DIAGNOSIS — E785 Hyperlipidemia, unspecified: Secondary | ICD-10-CM

## 2015-04-26 DIAGNOSIS — F1410 Cocaine abuse, uncomplicated: Secondary | ICD-10-CM | POA: Diagnosis not present

## 2015-04-26 DIAGNOSIS — I10 Essential (primary) hypertension: Secondary | ICD-10-CM | POA: Diagnosis not present

## 2015-04-26 NOTE — Addendum Note (Signed)
Addended by: Roberts Gaudy on: 04/26/2015 01:18 PM   Modules accepted: Medications

## 2015-04-26 NOTE — Progress Notes (Signed)
Patient ID: [name] [name], female   DOB: 12-Sep-1965, 50 y.o.   MRN: [id number]    [name] [name] Date of Birth: August 28, 1965 Medical Record [id number]   Chief complain: 6 months follow up  History of Present Illness: Ms. [name] [name] is seen back today for a work in visit. Seen for Dr. Meda Coffee. 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 She did not require PCI. EF was normal. Other issues as noted. Has been in cardiac rehab and did well.  Comes back today. Here alone. She states that her shortness of breath and lower extremity edema has resolved. She has been taking Lasix 20 mg daily as needed. She denies any arm chest pain, orthopnea, PND. No syncope. She states that occasionally she will have a few second lasting palpitations they're not associated with any other symptoms such as dizziness or shortness of breath.  Sister did accompany her to the office but not to the room due to last OV note mentioning that she did not wish to have substance abuse issues discussed with anyone else present.   04/26/2015 - 6 months follow-up, patient continues to work at daycare, she has no chest pain or shortness of breath during a full-time drop. She is his Lasix as needed and infrequently for lower extremity edema. Lately her blood pressure has been rather low but she denies any dizziness or falls. She also denies palpitation or syncope and no orthopnea. She is compliant with her medications overall feels very well..     Current Outpatient Prescriptions  Medication Sig Dispense Refill  . acetaminophen (TYLENOL) 500 MG tablet Take 1,000 mg by mouth every 6 (six) hours as needed for mild pain.    Marland Kitchen albuterol (PROAIR HFA) 108 (90 BASE) MCG/ACT inhaler Inhale 2 puffs into the lungs every 4 (four) hours as needed for wheezing or shortness of breath. 1 Inhaler 11  . amoxicillin (AMOXIL) 500 MG capsule Take 1 capsule (500 mg total) by mouth 2 (two) times daily. For a total of 7 days. 14 capsule 0  . aspirin EC 81  MG EC tablet Take 1 tablet (81 mg total) by mouth daily.    . budesonide-formoterol (SYMBICORT) 160-4.5 MCG/ACT inhaler Inhale 2 puffs into the lungs 2 (two) times daily. 1 Inhaler 5  . carvedilol (COREG) 3.125 MG tablet TAKE ONE TABLET BY MOUTH TWICE DAILY WITH MEALS 60 tablet 8  . clopidogrel (PLAVIX) 75 MG tablet Take 1 tablet (75 mg total) by mouth daily with breakfast. 30 tablet 1  . furosemide (LASIX) 20 MG tablet Take 20 mg by mouth daily as needed for fluid or edema.     . isosorbide mononitrate (IMDUR) 30 MG 24 hr tablet TAKE ONE TABLET BY MOUTH ONCE DAILY 90 tablet 0  . loratadine (CLARITIN) 10 MG tablet Take 10 mg by mouth daily.    . nitroGLYCERIN (NITROSTAT) 0.4 MG SL tablet Place 1 tablet (0.4 mg total) under the tongue every 5 (five) minutes x 3 doses as needed for chest pain. 25 tablet 3  . pravastatin (PRAVACHOL) 40 MG tablet Take 1 tablet (40 mg total) by mouth daily. (Patient taking differently: Take 40 mg by mouth at bedtime. ) 30 tablet 5   No current facility-administered medications for this visit.    Allergies  Allergen Reactions  . Demerol Itching  . Latex Itching    gloves  . Novocain [Procaine Hcl] Swelling    Past Medical History  Diagnosis Date  . Abnormal Pap smear  Colpo (twice)  . Anemia   . Chlamydia     years ago  . Trichomonas     years ago  . Breast disorder     soreness in left breast   . Depression   . CAD (coronary artery disease)     a. 01/2013 NSTEMI/Cath: LM nl, LAD 54m, LCX nl, RI nl, RCA nl, PDA ostial nonocclusive intraluminal thrombus, EF 65%, Treated with heparin x 48 hrs->Med Rx (occurred in setting of + cocaine);  b. 01/2013 Echo: EF 60-65%, nl wall motion.  . Polysubstance abuse     tobacco/cocaine/marijuana (+ UDS 01/2013).  Marland Kitchen Heart disease     MI Jan 2015  . Anxiety and depression 01/01/2014  . Myocardial infarction [facility])     Past Surgical History  Procedure Laterality Date  . Breast surgery      lump removed from right  breast  . Endometrial ablation w/ novasure    . Groin mass open biopsy    . Cervical cone biopsy    . Cesarean section      2 c-sections  . Ectopic pregnancy surgery    . Left heart catheterization with coronary angiogram N/A 02/08/2013    Procedure: LEFT HEART CATHETERIZATION WITH CORONARY ANGIOGRAM;  Surgeon: Sinclair Grooms, MD;  Location: [facility] CATH LAB;  Service: Cardiovascular;  Laterality: N/A;  . Tubal ligation      History  Smoking status  . Former Smoker -- 0.50 packs/day for 31 years  . Types: Cigarettes  . Quit date: 02/05/2013  Smokeless tobacco  . Never Used    History  Alcohol Use  . 0.0 oz/week  . 0 Standard drinks or equivalent per week    Comment: rarely    Family History  Problem Relation Age of Onset  . Breast cancer Mother   . Hypertension Brother   . Kidney failure Brother   . Hypertension Sister     Review of Systems: The review of systems is per the HPI.  All other systems were reviewed and are negative.  Physical Exam: BP 98/64 mmHg  Pulse 94  Ht 5\' 3"  (1.6 m)  Wt 128 lb (58.06 kg)  BMI 22.68 kg/m2  SpO2 97% Patient is pleasant and in no acute distress. Skin is warm and dry. Color is normal.  HEENT is unremarkable. Normocephalic/atraumatic. PERRL. Sclera are nonicteric. Neck is supple. No masses. No JVD. Lungs are clear. Cardiac exam shows a regular rate and rhythm. Abdomen is soft. Extremities are with trace lower extremity edema. Gait and ROM are intact. No gross neurologic deficits noted.  Wt Readings from Last 3 Encounters:  04/26/15 128 lb (58.06 kg)  03/16/15 128 lb (58.06 kg)  03/13/15 129 lb 6.4 oz (58.695 kg)    LABORATORY DATA: PENDING  Lab Results  Component Value Date   WBC 6.4 02/19/2015   HGB 13.0 02/19/2015   HCT 39.4 02/19/2015   PLT 279 02/19/2015   GLUCOSE 107* 04/03/2015   CHOL 189 04/03/2015   TRIG 98 04/03/2015   HDL 48 04/03/2015   LDLCALC 121 04/03/2015   ALT 9 04/03/2015   AST 14 04/03/2015   NA 141  04/03/2015   K 3.4* 04/03/2015   CL 100 04/03/2015   CREATININE 1.01 04/03/2015   BUN 5* 04/03/2015   CO2 27 04/03/2015   TSH 0.232* 03/01/2015   INR 1.13 02/06/2013   HGBA1C 5.5 03/01/2015    Exercise nuclear stress test: 11/10/2013 Impression Exercise Capacity: Good exercise capacity. BP Response:  Hypotensive blood pressure response. Clinical Symptoms: There is dyspnea. ECG Impression: No significant ST segment change suggestive of ischemia. Comparison with Prior Nuclear Study: No images to compare  Overall Impression: Low risk stress nuclear study with a small sized, mild fixed mid and basal inferior defect that is most consistent with diaphragmatic attenuation and increased gut uptake. No evidence of ischemia. Of note the patient's BP was 116/53mmHg in stage 3 and decreased to 140/2mmHg in Stage 2. The next BP was in recovery at 173/3mmHg.  LV Ejection Fraction: 63%. LV Wall Motion: NL LV Function; NL Wall Motion    Assessment / Plan:  1. CAD, h/o NSTEMI in January 2015- with nonocclusive intraluminal thrombus - managed with heparin, no stent was placed. She currently has no angina, no ischemic workup needed at this time. We will continue dual antiplatelet therapy, if dental work needed she can discontinue Plavix temporarily. I would continue carvedilol, pravastatin and discontinue Imdur as her blood pressure is rather low. Repeat echocardiogram on 04/03/2015 showed normal LVEF and normal diastolic function and only trivial for tricuspid regurgitation otherwise normal echocardiogram.  2. Acute pericarditis on 12/17/13,  Resolved with prednisone taper, finished 2 months course of colchicine.   3. LE Swelling - resolved, continue Lasix 20 mg daily when necessary.  4. HLD - continue pravastatin, most recent LDL 120, HDL and triglycerides at goal. Patient was not compliant to a good diet so it won't change medication just work on her diet for now.  4. Elevated HbA1c -  repeat 5.9%  Follow up in 6 months with CMP and lipids.  Dorothy Spark 04/26/2015

## 2015-04-26 NOTE — Patient Instructions (Signed)
Your physician has recommended you make the following change in your medication:  1.) STOP IMDUR  Your physician wants you to follow-up in: [place].   You will receive a reminder letter in the mail two months in advance. If you don't receive a letter, please call our office to schedule the follow-up appointment.

## 2015-05-02 ENCOUNTER — Telehealth: Payer: Self-pay | Admitting: Cardiology

## 2015-05-02 NOTE — Telephone Encounter (Signed)
PT AWARE WILL FORWARD  TO  DR Meda Coffee  FOR  REVIEW . PT  NEEDS  LETTER  STATING  HAD MI AND  REQUIRES  HEATING AND  AIR./CY

## 2015-05-02 NOTE — Telephone Encounter (Signed)
[name] [name] is calling to get a letter stating that she had a heart attack and that she needs her heating and cooling for Medical reasons. Please call if you have any further questions .Marland Kitchen  Thanks

## 2015-05-02 NOTE — Telephone Encounter (Signed)
To Whom IT May Concern,  The patient is currently under my care and she needs a heating and cooling system as a medical necessity for her cardiac condition.   Sincerely,  Ena Dawley, MD

## 2015-05-03 ENCOUNTER — Telehealth: Payer: Self-pay | Admitting: Cardiology

## 2015-05-03 ENCOUNTER — Encounter: Payer: Self-pay | Admitting: *Deleted

## 2015-05-03 NOTE — Telephone Encounter (Signed)
Contacted the pt back to inform her that Dr Meda Coffee wrote her a letter to receive a heating and cooling system for medical necessity of cardiac hx.  Informed the pt that this is available for her to pick up at the front desk at her earliest convenience.  Pt verbalized understanding and very gracious for all the assistance provided.

## 2015-05-03 NOTE — Telephone Encounter (Signed)
Follow Up:; ° ° °Returning your call. °

## 2015-05-23 ENCOUNTER — Other Ambulatory Visit: Payer: Self-pay | Admitting: Cardiology

## 2015-06-28 ENCOUNTER — Other Ambulatory Visit: Payer: Self-pay

## 2015-06-28 DIAGNOSIS — Z1231 Encounter for screening mammogram for malignant neoplasm of breast: Secondary | ICD-10-CM

## 2015-07-05 ENCOUNTER — Other Ambulatory Visit: Payer: Self-pay | Admitting: Family Medicine

## 2015-07-11 ENCOUNTER — Ambulatory Visit
Admission: RE | Admit: 2015-07-11 | Discharge: 2015-07-11 | Disposition: A | Discharge status: Home / Self Care | Payer: BLUE CROSS/BLUE SHIELD | Source: Ambulatory Visit

## 2015-07-11 DIAGNOSIS — Z1231 Encounter for screening mammogram for malignant neoplasm of breast: Secondary | ICD-10-CM

## 2015-07-19 ENCOUNTER — Other Ambulatory Visit: Payer: Self-pay | Admitting: Cardiology

## 2015-08-10 ENCOUNTER — Other Ambulatory Visit: Payer: Self-pay | Admitting: Family Medicine

## 2015-08-31 ENCOUNTER — Telehealth: Payer: Self-pay | Admitting: Cardiology
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
## 2015-08-31 NOTE — Telephone Encounter (Signed)
New Message:    Pt says she needs to talk you about an issue she is having.Pt did not give me any details.

## 2015-09-04 NOTE — Telephone Encounter (Signed)
Follow up       Talk to the nurse regarding some paperwork.

## 2015-09-04 NOTE — Telephone Encounter (Signed)
Left a detailed message to inform the pt that we have not received any paperwork from her apartment complex yet and they should have this faxed to [phone] attention Dr Meda Coffee and Karlene Einstein.

## 2015-09-05 NOTE — Telephone Encounter (Signed)
Informed the pt that we received her paperwork and I will have Dr Meda Coffee sign this accordingly, on return to the office.  Pt request that after we complete her paperwork, and fax this, she would like a copy sent to her current mailing address.  Informed the pt that would be no problem at all. Pt verbalized understanding, agrees with this plan, and gracious for all the assistance provided.

## 2015-09-11 ENCOUNTER — Telehealth: Payer: Self-pay | Admitting: Cardiology

## 2015-09-11 NOTE — Telephone Encounter (Signed)
Pts paperwork was completed by Dr Meda Coffee today.  This was faxed to the given information provided and a copy was mailed to the pts confirmed mailing address, as requested.

## 2015-09-11 NOTE — Telephone Encounter (Signed)
[employer] for [employer] signed and completed by Dr.Nelson- these were scanned in EPIC,faxed and mailed to pt home address.

## 2015-09-26 ENCOUNTER — Ambulatory Visit (INDEPENDENT_AMBULATORY_CARE_PROVIDER_SITE_OTHER): Payer: BLUE CROSS/BLUE SHIELD | Admitting: Internal Medicine

## 2015-09-26 ENCOUNTER — Encounter: Payer: Self-pay | Admitting: Internal Medicine

## 2015-09-26 ENCOUNTER — Other Ambulatory Visit (HOSPITAL_COMMUNITY)
Admission: RE | Admit: 2015-09-26 | Discharge: 2015-09-26 | Disposition: A | Discharge status: Home / Self Care | Payer: BLUE CROSS/BLUE SHIELD | Source: Ambulatory Visit | Attending: Family Medicine | Admitting: Family Medicine

## 2015-09-26 VITALS — BP 111/73 | HR 73 | Temp 98.3°F | Ht 63.0 in | Wt 127.8 lb

## 2015-09-26 DIAGNOSIS — Z113 Encounter for screening for infections with a predominantly sexual mode of transmission: Secondary | ICD-10-CM | POA: Insufficient documentation

## 2015-09-26 DIAGNOSIS — H66001 Acute suppurative otitis media without spontaneous rupture of ear drum, right ear: Secondary | ICD-10-CM | POA: Diagnosis not present

## 2015-09-26 DIAGNOSIS — N898 Other specified noninflammatory disorders of vagina: Secondary | ICD-10-CM

## 2015-09-26 LAB — POCT WET PREP (WET MOUNT): CLUE CELLS WET PREP WHIFF POC: POSITIVE

## 2015-09-26 MED ORDER — AMOXICILLIN 500 MG PO CAPS: 500.0000 mg | ORAL_CAPSULE | Freq: Two times a day (BID) | ORAL | 0 refills | Status: DC

## 2015-09-26 MED ORDER — METRONIDAZOLE 500 MG PO TABS: 2000.0000 mg | ORAL_TABLET | Freq: Once | ORAL | 0 refills | Status: AC

## 2015-09-26 NOTE — Assessment & Plan Note (Signed)
Patient presenting with concern for STD given vaginal discharge and condom breaking during sexual intercourse. Wet prep +BV and Trichomonas.  -treat Trichomonas with Flagyl 2g, if vaginal discharge persists will send in full treatment for BV but suspect symptoms more related to trichomonas  -counseled patient to have partner tested/treated and to avoid sexual intercourse for at least 7 days after treatment  -GC/Chlamydia swab obtained  -HIV and RPR

## 2015-09-26 NOTE — Assessment & Plan Note (Signed)
Left TM concerning for AOM. Hearing screening mostly normal.  -Rx for Amoxicillin 500 mg BID x7 days given  -PCP to f/u hearing screen when AOM resolves

## 2015-09-26 NOTE — Progress Notes (Signed)
Subjective:    [name] [name] - 50 y.o. female MRN [id number]  Date of birth: July 02, 1965  HPI  [name] [name] is here for SDA for ear pain.  EAR PAIN  Location: Bilateral but L>R, has had history of otitis media and cerumen impaction in the right ear this year  Ear pain started: Describes pain as pressure. Started about 3-4 weeks ago.  Medications tried: has tried ear wax removal drops  Recent ear trauma: no Prior ear surgeries: no Antibiotics in the last 30 days: no  History of diabetes: no   Symptoms Ear discharge: no Fever: no Pain with chewing: no Ringing in ears: no Dizziness: no Hearing loss: unsure  Rashes or blisters around ear: no  Weight loss: no   Vaginal Discharge: Present for 1-2 weeks. Foul odor. White thick discharge present. Condom broke during sexual intercourse. Has had +Trichomonas in the past. Denies history of other STDs. Afebrile. Miminal lower abdominal pain.   Review of Symptoms - see HPI PMH - Smoking status noted.     -  reports that she quit smoking about 2 years ago. Her smoking use included Cigarettes. She has a 15.50 pack-year smoking history. She has never used smokeless tobacco. - Review of Systems: Per HPI. - Past Medical History: Patient Active Problem List   Diagnosis Date Noted  . Encounter for screening examination for sexually transmitted disease 09/26/2015  . Cocaine abuse 04/26/2015  . Hemoptysis 03/03/2015  . Elevated hemoglobin A1c 03/01/2015  . Concern about STD in female without diagnosis 03/01/2015  . Acute purulent otitis media 03/01/2015  . Atypical chest pain 03/01/2015  . TIA (transient ischemic attack) 03/01/2015  . Stable angina (HCC)   . Essential hypertension   . Asthma, chronic   . Anxiety and depression 01/01/2014  . COPD (chronic obstructive pulmonary disease) ([place]) 06/03/2013  . Hyperlipidemia 02/11/2013  . CAD (coronary artery disease) 02/11/2013  . Tobacco abuse 02/10/2013  . Polysubstance abuse:  Cocaine,  marijuana 02/10/2013  . Subclinical hyperthyroidism 02/10/2013  . Breast lump on right side at 9 o'clock position 10/13/2012  . Breast lump on left side at 3 o'clock position 06/09/2012   - Medications: reviewed and updated    Objective:   Physical Exam BP 111/73   Pulse 73   Temp 98.3 F (36.8 C) (Oral)   Ht 5\' 3"  (1.6 m)   Wt 127 lb 12.8 oz (58 kg)   BMI 22.64 kg/m  Gen: NAD, alert, cooperative with exam, well-appearing HEENT: NCAT, PERRL, clear conjunctiva, oropharynx clear, supple neck, no pain with palpation of tragus or lobes of ears bilaterally, left TM bulging with pus behind it and erythematous, right TM normal  CV: RRR, good S1/S2, no murmur, no edema, capillary refill brisk  Resp: CTABL, no wheezes, non-labored Abd: SNTND, BS present, no guarding or organomegaly GU/GYN: Exam performed in the presence of a chaperone. External genitalia within normal limits.  Vaginal mucosa pink, moist, normal rugae.  Nonfriable cervix without lesions or bleeding noted on speculum exam. Small amount of yellow discharge present in the posterior fornix.  Bimanual exam revealed normal, nongravid uterus.  No cervical motion tenderness. No adnexal masses bilaterally.    Hearing Screen:  Right Ear Pass  Left Ear: Pass except fail at 1000 Hz     Assessment & Plan:   Acute purulent otitis media Left TM concerning for AOM. Hearing screening mostly normal.  -Rx for Amoxicillin 500 mg BID x7 days given  -PCP to f/u hearing screen when  AOM resolves   Encounter for screening examination for sexually transmitted disease Patient presenting with concern for STD given vaginal discharge and condom breaking during sexual intercourse. Wet prep +BV and Trichomonas.  -treat Trichomonas with Flagyl 2g, if vaginal discharge persists will send in full treatment for BV but suspect symptoms more related to trichomonas  -counseled patient to have partner tested/treated and to avoid sexual intercourse for at least  7 days after treatment  -GC/Chlamydia swab obtained  -HIV and RPR     Phill Myron, D.O. 09/26/2015, 12:08 PM PGY-2, [place]

## 2015-09-26 NOTE — Patient Instructions (Signed)
Take 4 pills of the Flagyl at once for the Trichomonas. Please have your partner tested and treated if needed. If your vaginal discharge continues despite this treatment let us know and we will send in a longer course of Flagyl to treat Bacterial Vaginosis.   We will call you with your other lab results.   Take the Amoxicillin twice per day for 7 days for your ear infection. Let us know if symptoms do not improve with this treatment.

## 2015-09-27 LAB — CERVICOVAGINAL ANCILLARY ONLY
Chlamydia: NEGATIVE
Neisseria Gonorrhea: NEGATIVE

## 2015-09-27 LAB — RPR

## 2015-09-27 LAB — HIV ANTIBODY (ROUTINE TESTING W REFLEX): HIV 1&2 Ab, 4th Generation: NONREACTIVE

## 2015-10-03 ENCOUNTER — Telehealth: Payer: Self-pay | Admitting: Internal Medicine

## 2015-10-03 NOTE — Telephone Encounter (Signed)
Pt is calling for her test results. Please call. jw

## 2015-10-04 NOTE — Telephone Encounter (Signed)
Please call patient and let her know that HIV, Syphilis, Gonorrhea, and Chlamydia tests were negative.

## 2015-10-04 NOTE — Telephone Encounter (Signed)
Seen by Dr. Juleen China most recently. Please forward to her. Thanks

## 2015-10-05 ENCOUNTER — Telehealth: Payer: Self-pay | Admitting: *Deleted

## 2015-10-05 NOTE — Telephone Encounter (Signed)
Advised pt of negative results per Dr. Juleen China message. [name] [name], Rieley Khalsa D, [place]

## 2015-10-05 NOTE — Telephone Encounter (Signed)
Message given to pt

## 2015-10-05 NOTE — Telephone Encounter (Signed)
-----   Message from Nicolette Bang, DO sent at 09/28/2015  4:52 PM EDT ----- Please call patient and let her know that HIV, Syphilis, Gonorrhea, and Chlamydia tests were negative.  Phill Myron, D.O. 09/28/2015, 4:52 PM PGY-2, [place]

## 2015-10-16 ENCOUNTER — Other Ambulatory Visit: Payer: Self-pay | Admitting: *Deleted

## 2015-10-16 MED ORDER — NITROGLYCERIN 0.4 MG SL SUBL: SUBLINGUAL_TABLET | SUBLINGUAL | 0 refills | Status: DC

## 2015-10-19 ENCOUNTER — Encounter: Payer: Self-pay | Admitting: Cardiology

## 2015-11-03 ENCOUNTER — Ambulatory Visit (INDEPENDENT_AMBULATORY_CARE_PROVIDER_SITE_OTHER): Payer: BLUE CROSS/BLUE SHIELD | Admitting: Cardiology

## 2015-11-03 ENCOUNTER — Encounter: Payer: Self-pay | Admitting: Cardiology

## 2015-11-03 ENCOUNTER — Encounter (INDEPENDENT_AMBULATORY_CARE_PROVIDER_SITE_OTHER): Payer: Self-pay

## 2015-11-03 VITALS — BP 112/68 | HR 64 | Ht 63.0 in | Wt 124.0 lb

## 2015-11-03 DIAGNOSIS — I309 Acute pericarditis, unspecified: Secondary | ICD-10-CM | POA: Diagnosis not present

## 2015-11-03 DIAGNOSIS — E782 Mixed hyperlipidemia: Secondary | ICD-10-CM

## 2015-11-03 DIAGNOSIS — I10 Essential (primary) hypertension: Secondary | ICD-10-CM

## 2015-11-03 DIAGNOSIS — R002 Palpitations: Secondary | ICD-10-CM | POA: Diagnosis not present

## 2015-11-03 DIAGNOSIS — I214 Non-ST elevation (NSTEMI) myocardial infarction: Secondary | ICD-10-CM | POA: Diagnosis not present

## 2015-11-03 DIAGNOSIS — I252 Old myocardial infarction: Secondary | ICD-10-CM

## 2015-11-03 DIAGNOSIS — F1410 Cocaine abuse, uncomplicated: Secondary | ICD-10-CM

## 2015-11-03 MED ORDER — PRAVASTATIN SODIUM 40 MG PO TABS: 40.0000 mg | ORAL_TABLET | Freq: Every day | ORAL | 6 refills | Status: DC

## 2015-11-03 MED ORDER — IBUPROFEN 400 MG PO TABS: 400.0000 mg | ORAL_TABLET | Freq: Two times a day (BID) | ORAL | 0 refills | Status: DC

## 2015-11-03 MED ORDER — NITROGLYCERIN 0.4 MG SL SUBL: SUBLINGUAL_TABLET | SUBLINGUAL | 6 refills | Status: DC

## 2015-11-03 MED ORDER — CLOPIDOGREL BISULFATE 75 MG PO TABS: 75.0000 mg | ORAL_TABLET | Freq: Every day | ORAL | 8 refills | Status: DC

## 2015-11-03 MED ORDER — COLCHICINE 0.6 MG PO TABS: 0.6000 mg | ORAL_TABLET | Freq: Two times a day (BID) | ORAL | 0 refills | Status: DC

## 2015-11-03 MED ORDER — FUROSEMIDE 20 MG PO TABS: 20.0000 mg | ORAL_TABLET | Freq: Every day | ORAL | 3 refills | Status: DC | PRN

## 2015-11-03 MED ORDER — CARVEDILOL 3.125 MG PO TABS: 3.1250 mg | ORAL_TABLET | Freq: Two times a day (BID) | ORAL | 8 refills | Status: DC

## 2015-11-03 NOTE — Patient Instructions (Signed)
Medication Instructions:   START TAKING COLCHICINE 0.6 MG BY MOUTH TWICE DAILY FOR 3 MONTHS ONLY  TAKE IBUPROFEN 400 MG BY MOUTH TWICE DAILY FOR ONE WEEK ONLY    Testing/Procedures:  Your physician has recommended that you wear a 24 HOUR holter monitor. Holter monitors are medical devices that record the heart's electrical activity. Doctors most often use these monitors to diagnose arrhythmias. Arrhythmias are problems with the speed or rhythm of the heartbeat. The monitor is a small, portable device. You can wear one while you do your normal daily activities. This is usually used to diagnose what is causing palpitations/syncope (passing out).    Follow-Up:  2 MONTHS WITH DR Meda Coffee       If you need a refill on your cardiac medications before your next appointment, please call your pharmacy.

## 2015-11-03 NOTE — Progress Notes (Signed)
Patient ID: [name] [name], female   DOB: December 30, 1965, 50 y.o.   MRN: [id number]    Altamease Cambron Date of Birth: 1965/11/24 Medical Record [id number]   Chief complain: 6 months follow up  History of Present Illness: [name] [name] is seen back today for a work in visit. Seen for Dr. Meda Coffee. 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 She did not require PCI. EF was normal. Other issues as noted. Has been in cardiac rehab and did well.  Comes back today. Here alone. She states that her shortness of breath and lower extremity edema has resolved. She has been taking Lasix 20 mg daily as needed. She denies any arm chest pain, orthopnea, PND. No syncope. She states that occasionally she will have a few second lasting palpitations they're not associated with any other symptoms such as dizziness or shortness of breath.  Sister did accompany her to the office but not to the room due to last OV note mentioning that she did not wish to have substance abuse issues discussed with anyone else present.   04/26/2015 - 6 months follow-up, patient continues to work at daycare, she has no chest pain or shortness of breath during a full-time drop. She is his Lasix as needed and infrequently for lower extremity edema. Lately her blood pressure has been rather low but she denies any dizziness or falls. She also denies palpitation or syncope and no orthopnea. She is compliant with her medications overall feels very well.  11/03/2015 - the patient is coming after 6 months, she continues to deal with poor social situation where she [place] in very dangerous neighborhood and is trying to be transferred, the previous sleeve rolled her supportive paperwork for the transfer. She's been experiencing upper respiratory infection and some chest pain while coughing or on deep inspiration also inserted in position in bed. She ran out of colchicine in the past as she couldn't afford it. She otherwise denies exertional chest pain or  shortness of breath. She also states that she has been experiencing palpitations every day up to few minutes without associated chest, they occur every day and sometimes several times a day. No associated dizziness or syncope.    Current Outpatient Prescriptions  Medication Sig Dispense Refill  . acetaminophen (TYLENOL) 500 MG tablet Take 1,000 mg by mouth every 6 (six) hours as needed for mild pain.    Marland Kitchen albuterol (PROAIR HFA) 108 (90 BASE) MCG/ACT inhaler Inhale 2 puffs into the lungs every 4 (four) hours as needed for wheezing or shortness of breath. 1 Inhaler 11  . aspirin EC 81 MG EC tablet Take 1 tablet (81 mg total) by mouth daily.    . budesonide-formoterol (SYMBICORT) 160-4.5 MCG/ACT inhaler Inhale 2 puffs into the lungs 2 (two) times daily. 1 Inhaler 5  . carvedilol (COREG) 3.125 MG tablet TAKE ONE TABLET BY MOUTH TWICE DAILY WITH MEALS 60 tablet 8  . clopidogrel (PLAVIX) 75 MG tablet Take 1 tablet (75 mg total) by mouth daily with breakfast. 30 tablet 8  . furosemide (LASIX) 20 MG tablet Take 20 mg by mouth daily as needed for fluid or edema.     Marland Kitchen loratadine (CLARITIN) 10 MG tablet Take 10 mg by mouth daily.    . nitroGLYCERIN (NITROSTAT) 0.4 MG SL tablet DISSOLVE ONE TABLET UNDER THE TONGUE EVERY 5 MINUTES AS NEEDED FOR CHEST PAIN.  DO NOT EXCEED A TOTAL OF 3 DOSES IN 15 MINUTES 25 tablet 0  . pravastatin (PRAVACHOL)  40 MG tablet TAKE ONE TABLET BY MOUTH ONCE DAILY 30 tablet 6   No current facility-administered medications for this visit.     Allergies  Allergen Reactions  . Demerol Itching  . Latex Itching    gloves  . Novocain [Procaine Hcl] Swelling    Past Medical History:  Diagnosis Date  . Abnormal Pap smear    Colpo (twice)  . Anemia   . Anxiety and depression 01/01/2014  . Breast disorder    soreness in left breast   . CAD (coronary artery disease)    a. 01/2013 NSTEMI/Cath: LM nl, LAD 30m, LCX nl, RI nl, RCA nl, PDA ostial nonocclusive intraluminal thrombus, EF  65%, Treated with heparin x 48 hrs->Med Rx (occurred in setting of + cocaine);  b. 01/2013 Echo: EF 60-65%, nl wall motion.  . Chlamydia    years ago  . Depression   . Heart disease    MI Jan 2015  . Myocardial infarction   . Polysubstance abuse    tobacco/cocaine/marijuana (+ UDS 01/2013).  . Trichomonas    years ago    Past Surgical History:  Procedure Laterality Date  . BREAST SURGERY     lump removed from right breast  . CERVICAL CONE BIOPSY    . CESAREAN SECTION     2 c-sections  . ECTOPIC PREGNANCY SURGERY    . ENDOMETRIAL ABLATION W/ NOVASURE    . GROIN MASS OPEN BIOPSY    . LEFT HEART CATHETERIZATION WITH CORONARY ANGIOGRAM N/A 02/08/2013   Procedure: LEFT HEART CATHETERIZATION WITH CORONARY ANGIOGRAM;  Surgeon: Sinclair Grooms, MD;  Location: [facility] CATH LAB;  Service: Cardiovascular;  Laterality: N/A;  . TUBAL LIGATION      History  Smoking Status  . Former Smoker  . Packs/day: 0.50  . Years: 31.00  . Types: Cigarettes  . Quit date: 02/05/2013  Smokeless Tobacco  . Never Used    History  Alcohol Use  . 0.0 oz/week    Comment: rarely    Family History  Problem Relation Age of Onset  . Breast cancer Mother   . Hypertension Brother   . Kidney failure Brother   . Hypertension Sister     Review of Systems: The review of systems is per the HPI.  All other systems were reviewed and are negative.  Physical Exam: BP 112/68   Pulse 64   Ht 5\' 3"  (1.6 m)   Wt 124 lb (56.2 kg)   BMI 21.97 kg/m  Patient is pleasant and in no acute distress. Skin is warm and dry. Color is normal.  HEENT is unremarkable. Normocephalic/atraumatic. PERRL. Sclera are nonicteric. Neck is supple. No masses. No JVD. Lungs are clear. Cardiac exam shows a regular rate and rhythm. Abdomen is soft. Extremities are with trace lower extremity edema. Gait and ROM are intact. No gross neurologic deficits noted.  Wt Readings from Last 3 Encounters:  11/03/15 124 lb (56.2 kg)  09/26/15 127 lb  12.8 oz (58 kg)  04/26/15 128 lb (58.1 kg)    LABORATORY DATA: PENDING  Lab Results  Component Value Date   WBC 6.4 02/19/2015   HGB 13.0 02/19/2015   HCT 39.4 02/19/2015   PLT 279 02/19/2015   GLUCOSE 107 (H) 04/03/2015   CHOL 189 04/03/2015   TRIG 98 04/03/2015   HDL 48 04/03/2015   LDLCALC 121 04/03/2015   ALT 9 04/03/2015   AST 14 04/03/2015   NA 141 04/03/2015   K 3.4 (L) 04/03/2015  CL 100 04/03/2015   CREATININE 1.01 04/03/2015   BUN 5 (L) 04/03/2015   CO2 27 04/03/2015   TSH 0.232 (L) 03/01/2015   INR 1.13 02/06/2013   HGBA1C 5.8 (H) 03/16/2015    Exercise nuclear stress test: 11/10/2013 Impression Exercise Capacity: Good exercise capacity. BP Response: Hypotensive blood pressure response. Clinical Symptoms: There is dyspnea. ECG Impression: No significant ST segment change suggestive of ischemia. Comparison with Prior Nuclear Study: No images to compare  Overall Impression: Low risk stress nuclear study with a small sized, mild fixed mid and basal inferior defect that is most consistent with diaphragmatic attenuation and increased gut uptake. No evidence of ischemia. Of note the patient's BP was 116/53mmHg in stage 3 and decreased to 140/4mmHg in Stage 2. The next BP was in recovery at 173/60mmHg.  LV Ejection Fraction: 63%. LV Wall Motion: NL LV Function; NL Wall Motion  EGD performed today 11/03/2015 shows normal sinus rhythm and normal EKG.  Assessment / Plan:  1. Recurrent acute pericarditis - start Ibuprofen 400 mg by mouth twice a day for 1 week and colchicine 0.6 g by mouth twice a day for 3 months.  2. CAD, h/o NSTEMI in January 2015- with nonocclusive intraluminal thrombus - managed with heparin, no stent was placed. She currently has no angina, no ischemic workup needed at this time. We will continue dual antiplatelet therapy. Repeat echocardiogram on 04/03/2015 showed normal LVEF and normal diastolic function and only trivial for  tricuspid regurgitation otherwise normal echocardiogram.  3. LE Swelling - resolved, continue Lasix 20 mg daily when necessary.  4. HLD - continue pravastatin, most recent LDL 120, HDL and triglycerides at goal. Patient was not compliant to a good diet so it won't change medication just work on her diet for now.  Follow up in 3 months.    Ena Dawley 11/03/2015

## 2015-11-16 ENCOUNTER — Ambulatory Visit (INDEPENDENT_AMBULATORY_CARE_PROVIDER_SITE_OTHER): Payer: BLUE CROSS/BLUE SHIELD

## 2015-11-16 DIAGNOSIS — R002 Palpitations: Secondary | ICD-10-CM | POA: Diagnosis not present

## 2015-11-16 DIAGNOSIS — I309 Acute pericarditis, unspecified: Secondary | ICD-10-CM | POA: Diagnosis not present

## 2015-11-16 DIAGNOSIS — I10 Essential (primary) hypertension: Secondary | ICD-10-CM

## 2015-12-01 ENCOUNTER — Ambulatory Visit (INDEPENDENT_AMBULATORY_CARE_PROVIDER_SITE_OTHER): Payer: BLUE CROSS/BLUE SHIELD | Admitting: Internal Medicine

## 2015-12-01 DIAGNOSIS — H9201 Otalgia, right ear: Secondary | ICD-10-CM | POA: Diagnosis not present

## 2015-12-01 MED ORDER — CIPROFLOXACIN-DEXAMETHASONE 0.3-0.1 % OT SUSP: 4.0000 [drp] | Freq: Two times a day (BID) | OTIC | 0 refills | Status: DC

## 2015-12-01 NOTE — Progress Notes (Signed)
   [facility] Kerrin Mo, MD Phone: [phone]  Reason For Visit: Ear Pain  # Ear Pain - this is patient third visit for this issue.  - Pain in bilateral ears - a couple of weeks  - Discharge/pain from right ear  - slight swollen on right side behind the right ear  - Hx of 2 ear infections x 2 in the last year- no hx of ear infections or ear problems previously. Received Amoxicillin 500 mg BID  - No tinnitus or hearing issues  - No fever or chills   Past Medical History Reviewed problem list.  Medications- reviewed and updated No additions to family history Social history- patient is a non-smoker  Objective: BP 117/70 (BP Location: Left Arm, Patient Position: Sitting, Cuff Size: Normal)   Pulse 89   Temp 98.2 F (36.8 C) (Oral)   Wt 122 lb (55.3 kg)   SpO2 96%   BMI 21.61 kg/m  Gen: NAD, alert, cooperative with exam HEENT: Normal    Neck: No masses palpated. No lymphadenopathy    Ears: Left TM clear,  Right TM with lesion consistent with cholesteatoma,  Right mastoid enlarged compared to left, thought not tender to palpation.    Eyes: PERRLA, EOMI    Nose: nasal turbinates moist    Throat: moist mucus membranes, no erythema  Hearing exam performed - normal   Assessment/Plan: See problem based a/p  Ear pain, right With findings consistent with right cholesteatoma on examination of right TM and possibly concern for mastoiditis due swelling of right mastoid compared to left  (though no fevers).  - Hearing exam performed - Normal  - Will provide patient with Ciprodex drops  - Urgent referral to ENT- appointment made today  - Defer CT Head to ENT

## 2015-12-01 NOTE — Patient Instructions (Addendum)
I have provided you with a ENT referral for your ear pain.  I have also added Ciprodex drops twice daily. Please follow up with me after you see ENT.

## 2015-12-02 NOTE — Assessment & Plan Note (Addendum)
With findings consistent with right cholesteatoma on examination of right TM and possibly concern for mastoiditis due swelling of right mastoid compared to left  (though no fevers).  - Hearing exam performed - Normal  - Will provide patient with Ciprodex drops  - Urgent referral to ENT- appointment made today  - Defer CT Head to ENT  - Discussed with Dr. Gwendlyn Deutscher

## 2015-12-04 ENCOUNTER — Telehealth: Payer: Self-pay | Admitting: *Deleted

## 2015-12-04 NOTE — Telephone Encounter (Signed)
Prior Authorization received from [place] for Ciprodex suspension.  PA form placed in provider box for completion. Derl Barrow, RN

## 2015-12-05 ENCOUNTER — Telehealth: Payer: Self-pay | Admitting: Emergency Medicine

## 2015-12-05 NOTE — Telephone Encounter (Signed)
atc pt, no answer, no vm set up.  Wcb.  

## 2015-12-06 MED ORDER — BUDESONIDE-FORMOTEROL FUMARATE 160-4.5 MCG/ACT IN AERO: 2.0000 | INHALATION_SPRAY | Freq: Two times a day (BID) | RESPIRATORY_TRACT | 1 refills | Status: DC

## 2015-12-06 MED ORDER — ALBUTEROL SULFATE HFA 108 (90 BASE) MCG/ACT IN AERS: 2.0000 | INHALATION_SPRAY | RESPIRATORY_TRACT | 0 refills | Status: DC | PRN

## 2015-12-06 NOTE — Telephone Encounter (Signed)
Filled out prior authorization  

## 2015-12-06 NOTE — Telephone Encounter (Signed)
PA form faxed to [facility] [place] for review.  Review process could take 24-72 hours to complete.  Derl Barrow, RN

## 2015-12-06 NOTE — Telephone Encounter (Signed)
Attempted to contact pt. No answer, voicemail is not set up. Will try back. 

## 2015-12-06 NOTE — Telephone Encounter (Signed)
Spoke with the pt  Rxs refilled and appt for rov set up  Nothing further needed

## 2015-12-07 NOTE — Telephone Encounter (Signed)
PA approved for Ciprodex 0.3-0.1% suspension via [employer] of [place].  Effective dates: 12/06/15-01/27/2038.  Reference number: [id number].  Derl Barrow, RN

## 2015-12-11 DIAGNOSIS — Z8669 Personal history of other diseases of the nervous system and sense organs: Secondary | ICD-10-CM | POA: Insufficient documentation

## 2015-12-11 DIAGNOSIS — H6121 Impacted cerumen, right ear: Secondary | ICD-10-CM | POA: Insufficient documentation

## 2015-12-29 ENCOUNTER — Ambulatory Visit: Payer: BLUE CROSS/BLUE SHIELD | Admitting: Emergency Medicine

## 2016-01-05 ENCOUNTER — Encounter: Payer: Self-pay | Admitting: Cardiology

## 2016-01-12 ENCOUNTER — Encounter: Payer: Self-pay | Admitting: Cardiology

## 2016-01-12 ENCOUNTER — Ambulatory Visit (INDEPENDENT_AMBULATORY_CARE_PROVIDER_SITE_OTHER): Payer: BLUE CROSS/BLUE SHIELD | Admitting: Cardiology

## 2016-01-12 ENCOUNTER — Encounter (INDEPENDENT_AMBULATORY_CARE_PROVIDER_SITE_OTHER): Payer: Self-pay

## 2016-01-12 VITALS — BP 100/80 | HR 80 | Ht 63.0 in | Wt 124.0 lb

## 2016-01-12 DIAGNOSIS — E785 Hyperlipidemia, unspecified: Secondary | ICD-10-CM | POA: Diagnosis not present

## 2016-01-12 DIAGNOSIS — I2583 Coronary atherosclerosis due to lipid rich plaque: Secondary | ICD-10-CM

## 2016-01-12 DIAGNOSIS — I309 Acute pericarditis, unspecified: Secondary | ICD-10-CM

## 2016-01-12 DIAGNOSIS — I2510 Atherosclerotic heart disease of native coronary artery without angina pectoris: Secondary | ICD-10-CM | POA: Diagnosis not present

## 2016-01-12 DIAGNOSIS — E782 Mixed hyperlipidemia: Secondary | ICD-10-CM

## 2016-01-12 DIAGNOSIS — I10 Essential (primary) hypertension: Secondary | ICD-10-CM | POA: Diagnosis not present

## 2016-01-12 DIAGNOSIS — R002 Palpitations: Secondary | ICD-10-CM

## 2016-01-12 LAB — COMPREHENSIVE METABOLIC PANEL
ALT: 15 U/L (ref 6–29)
AST: 17 U/L (ref 10–35)
Albumin: 4.2 g/dL (ref 3.6–5.1)
Alkaline Phosphatase: 82 U/L (ref 33–115)
BUN: 10 mg/dL (ref 7–25)
CO2: 24 mmol/L (ref 20–31)
Calcium: 10 mg/dL (ref 8.6–10.2)
Chloride: 103 mmol/L (ref 98–110)
Creat: 1.16 mg/dL — ABNORMAL HIGH (ref 0.50–1.10)
Glucose, Bld: 110 mg/dL — ABNORMAL HIGH (ref 65–99)
Potassium: 3.9 mmol/L (ref 3.5–5.3)
Sodium: 140 mmol/L (ref 135–146)
Total Bilirubin: 0.7 mg/dL (ref 0.2–1.2)
Total Protein: 7.5 g/dL (ref 6.1–8.1)

## 2016-01-12 LAB — CBC WITH DIFFERENTIAL/PLATELET
Basophils Absolute: 56 cells/uL (ref 0–200)
Basophils Relative: 1 %
Eosinophils Absolute: 112 cells/uL (ref 15–500)
Eosinophils Relative: 2 %
HCT: 42.8 % (ref 35.0–45.0)
Hemoglobin: 13.9 g/dL (ref 11.7–15.5)
Lymphocytes Relative: 50 %
Lymphs Abs: 2800 cells/uL (ref 850–3900)
MCH: 27.1 pg (ref 27.0–33.0)
MCHC: 32.5 g/dL (ref 32.0–36.0)
MCV: 83.6 fL (ref 80.0–100.0)
MPV: 11.8 fL (ref 7.5–12.5)
Monocytes Absolute: 504 cells/uL (ref 200–950)
Monocytes Relative: 9 %
Neutro Abs: 2128 cells/uL (ref 1500–7800)
Neutrophils Relative %: 38 %
Platelets: 262 10*3/uL (ref 140–400)
RBC: 5.12 MIL/uL — ABNORMAL HIGH (ref 3.80–5.10)
RDW: 13.5 % (ref 11.0–15.0)
WBC: 5.6 10*3/uL (ref 3.8–10.8)

## 2016-01-12 LAB — LIPID PANEL
Cholesterol: 174 mg/dL (ref <200)
HDL: 45 mg/dL — ABNORMAL LOW (ref >50)
LDL Cholesterol: 113 mg/dL — ABNORMAL HIGH (ref <100)
Total CHOL/HDL Ratio: 3.9 Ratio (ref <5.0)
Triglycerides: 81 mg/dL (ref <150)
VLDL: 16 mg/dL (ref <30)

## 2016-01-12 LAB — HEMOGLOBIN A1C
Hgb A1c MFr Bld: 5.8 % — ABNORMAL HIGH (ref <5.7)
Mean Plasma Glucose: 120 mg/dL

## 2016-01-12 LAB — BRAIN NATRIURETIC PEPTIDE: Brain Natriuretic Peptide: 5.6 pg/mL (ref <100)

## 2016-01-12 LAB — TSH: TSH: 0.29 mIU/L — ABNORMAL LOW

## 2016-01-12 MED ORDER — COLCHICINE 0.6 MG PO TABS: 0.6000 mg | ORAL_TABLET | Freq: Two times a day (BID) | ORAL | 0 refills | Status: DC

## 2016-01-12 MED ORDER — POTASSIUM CHLORIDE ER 10 MEQ PO TBCR: 10.0000 meq | EXTENDED_RELEASE_TABLET | Freq: Every day | ORAL | 3 refills | Status: DC | PRN

## 2016-01-12 NOTE — Patient Instructions (Addendum)
Medication Instructions:   START BACK TAKING COLCHICINE 0.6 MG BY MOUTH TWICE DAILY--TAKE FOR 3 MONTHS ONLY  TAKE POTASSIUM CHLORIDE 10 mEq ONCE DAILY ONLY AS NEEDED--TAKE THIS ONLY WHEN YOU TAKE YOUR LASIX    Labwork:  TODAY--CMET, CBC W DIFF, TSH, BNP, HEMOGLOBIN A1C, AND LIPIDS    Follow-Up:  3 MONTHS WITH DR Meda Coffee       If you need a refill on your cardiac medications before your next appointment, please call your pharmacy.

## 2016-01-12 NOTE — Progress Notes (Signed)
Patient ID: [name] [name], female   DOB: 1965-03-28, 50 y.o.   MRN: [id number]    [name] [name] Date of Birth: 03/27/1965 Medical Record [id number]   Chief complain: 6 months follow up  History of Present Illness: [name] is seen back today for a work in visit. Seen for Dr. Meda Coffee. 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 She did not require PCI. EF was normal. Other issues as noted. Has been in cardiac rehab and did well.  Comes back today. Here alone. She states that her shortness of breath and lower extremity edema has resolved. She has been taking Lasix 20 mg daily as needed. She denies any arm chest pain, orthopnea, PND. No syncope. She states that occasionally she will have a few second lasting palpitations they're not associated with any other symptoms such as dizziness or shortness of breath.  Sister did accompany her to the office but not to the room due to last OV note mentioning that she did not wish to have substance abuse issues discussed with anyone else present.   11/03/2015 - the patient is coming after 6 months, she continues to deal with poor social situation where she [place] in very dangerous neighborhood and is trying to be transferred, the previous sleeve rolled her supportive paperwork for the transfer. She's been experiencing upper respiratory infection and some chest pain while coughing or on deep inspiration also inserted in position in bed. She ran out of colchicine in the past as she couldn't afford it. She otherwise denies exertional chest pain or shortness of breath. She also states that she has been experiencing palpitations every day up to few minutes without associated chest, they occur every day and sometimes several times a day. No associated dizziness or syncope.  01/10/2016, patient is coming after 2 months, she states that her cultures definitely helps, but she, take it twice a day she developed significant diarrhea. She wants to continue taking  colchicine. She denies any exertional chest pain shortness of breath palpitations or syncope. She has no lower extremity edema or claudications. She has been compliant to her meds. She takes furosemide rarely. She has developed cough in the last couple weeks that is nonproductive now improving on Mucinex.    Current Outpatient Prescriptions  Medication Sig Dispense Refill  . acetaminophen (TYLENOL) 500 MG tablet Take 1,000 mg by mouth every 6 (six) hours as needed for mild pain.    Marland Kitchen albuterol (PROAIR HFA) 108 (90 Base) MCG/ACT inhaler Inhale 2 puffs into the lungs every 4 (four) hours as needed for wheezing or shortness of breath. 1 Inhaler 0  . aspirin EC 81 MG EC tablet Take 1 tablet (81 mg total) by mouth daily.    . budesonide-formoterol (SYMBICORT) 160-4.5 MCG/ACT inhaler Inhale 2 puffs into the lungs 2 (two) times daily. 1 Inhaler 1  . carvedilol (COREG) 3.125 MG tablet Take 1 tablet (3.125 mg total) by mouth 2 (two) times daily with a meal. 180 tablet 8  . clopidogrel (PLAVIX) 75 MG tablet Take 1 tablet (75 mg total) by mouth daily with breakfast. 90 tablet 8  . colchicine 0.6 MG tablet Take 1 tablet (0.6 mg total) by mouth 2 (two) times daily. 180 tablet 0  . furosemide (LASIX) 20 MG tablet Take 1 tablet (20 mg total) by mouth daily as needed for fluid or edema. 90 tablet 3  . loratadine (CLARITIN) 10 MG tablet Take 10 mg by mouth daily.    . nitroGLYCERIN (  NITROSTAT) 0.4 MG SL tablet DISSOLVE ONE TABLET UNDER THE TONGUE EVERY 5 MINUTES AS NEEDED FOR CHEST PAIN.  DO NOT EXCEED A TOTAL OF 3 DOSES IN 15 MINUTES 25 tablet 6  . pravastatin (PRAVACHOL) 40 MG tablet Take 1 tablet (40 mg total) by mouth daily. 90 tablet 6   No current facility-administered medications for this visit.     Allergies  Allergen Reactions  . Demerol Itching  . Latex Itching and Rash    gloves   . Meperidine Itching  . Novocain [Procaine Hcl] Swelling  . Procaine Swelling    Past Medical History:    Diagnosis Date  . Abnormal Pap smear    Colpo (twice)  . Anemia   . Anxiety and depression 01/01/2014  . Breast disorder    soreness in left breast   . CAD (coronary artery disease)    a. 01/2013 NSTEMI/Cath: LM nl, LAD 74m, LCX nl, RI nl, RCA nl, PDA ostial nonocclusive intraluminal thrombus, EF 65%, Treated with heparin x 48 hrs->Med Rx (occurred in setting of + cocaine);  b. 01/2013 Echo: EF 60-65%, nl wall motion.  . Chlamydia    years ago  . Depression   . Heart disease    MI Jan 2015  . Myocardial infarction   . Polysubstance abuse    tobacco/cocaine/marijuana (+ UDS 01/2013).  . Trichomonas    years ago    Past Surgical History:  Procedure Laterality Date  . BREAST SURGERY     lump removed from right breast  . CERVICAL CONE BIOPSY    . CESAREAN SECTION     2 c-sections  . ECTOPIC PREGNANCY SURGERY    . ENDOMETRIAL ABLATION W/ NOVASURE    . GROIN MASS OPEN BIOPSY    . LEFT HEART CATHETERIZATION WITH CORONARY ANGIOGRAM N/A 02/08/2013   Procedure: LEFT HEART CATHETERIZATION WITH CORONARY ANGIOGRAM;  Surgeon: Sinclair Grooms, MD;  Location[facility] CATH LAB;  Service: Cardiovascular;  Laterality: N/A;  . TUBAL LIGATION      History  Smoking Status  . Former Smoker  . Packs/day: 0.50  . Years: 31.00  . Types: Cigarettes  . Quit date: 02/05/2013  Smokeless Tobacco  . Never Used    History  Alcohol Use  . 0.0 oz/week    Comment: rarely    Family History  Problem Relation Age of Onset  . Breast cancer Mother   . Hypertension Brother   . Kidney failure Brother   . Hypertension Sister     Review of Systems: The review of systems is per the HPI.  All other systems were reviewed and are negative.  Physical Exam: BP 100/80   Pulse 80   Ht 5\' 3"  (1.6 m)   Wt 124 lb (56.2 kg)   SpO2 (!) 9%   BMI 21.97 kg/m  Patient is pleasant and in no acute distress. Skin is warm and dry. Color is normal.  HEENT is unremarkable. Normocephalic/atraumatic. PERRL. Sclera are  nonicteric. Neck is supple. No masses. No JVD. Lungs are clear. Cardiac exam shows a regular rate and rhythm. Abdomen is soft. Extremities are with trace lower extremity edema. Gait and ROM are intact. No gross neurologic deficits noted.  Wt Readings from Last 3 Encounters:  01/12/16 124 lb (56.2 kg)  12/01/15 122 lb (55.3 kg)  11/03/15 124 lb (56.2 kg)    LABORATORY DATA: PENDING  Lab Results  Component Value Date   WBC 6.4 02/19/2015   HGB 13.0 02/19/2015  HCT 39.4 02/19/2015   PLT 279 02/19/2015   GLUCOSE 107 (H) 04/03/2015   CHOL 189 04/03/2015   TRIG 98 04/03/2015   HDL 48 04/03/2015   LDLCALC 121 04/03/2015   ALT 9 04/03/2015   AST 14 04/03/2015   NA 141 04/03/2015   K 3.4 (L) 04/03/2015   CL 100 04/03/2015   CREATININE 1.01 04/03/2015   BUN 5 (L) 04/03/2015   CO2 27 04/03/2015   TSH 0.232 (L) 03/01/2015   INR 1.13 02/06/2013   HGBA1C 5.8 (H) 03/16/2015    Exercise nuclear stress test: 11/10/2013 Impression Exercise Capacity: Good exercise capacity. BP Response: Hypotensive blood pressure response. Clinical Symptoms: There is dyspnea. ECG Impression: No significant ST segment change suggestive of ischemia. Comparison with Prior Nuclear Study: No images to compare  Overall Impression: Low risk stress nuclear study with a small sized, mild fixed mid and basal inferior defect that is most consistent with diaphragmatic attenuation and increased gut uptake. No evidence of ischemia. Of note the patient's BP was 116/31mmHg in stage 3 and decreased to 140/3mmHg in Stage 2. The next BP was in recovery at 173/65mmHg.  LV Ejection Fraction: 63%. LV Wall Motion: NL LV Function; NL Wall Motion  EGD performed today 11/03/2015 shows normal sinus rhythm and normal EKG.  Assessment / Plan:  1. Recurrent acute pericarditis - Improving on colchicine 0.6 g by mouth twice a day, however only able to take it once a day as side effect is significant diarrhea. We will  refill and continue for another 3 months.  2. Respiratory infection with a cough, most probably viral bronchitis, continue Mucinex. Neurologic evaluation indicated right now she has no fever or chills.  3. CAD, h/o NSTEMI in January 2015- with nonocclusive intraluminal thrombus - managed with heparin, no stent was placed. She currently has no angina, no ischemic workup needed at this time. We will continue dual antiplatelet therapy. Repeat echocardiogram on 04/03/2015 showed normal LVEF and normal diastolic function and only trivial for tricuspid regurgitation otherwise normal echocardiogram.  4. LE Swelling - resolved, continue Lasix 20 mg daily when necessary.  5. HLD - continue pravastatin 40 mg daily that she is tolerating well.  Follow up in 3 months. We will check CMP, CBC, TSH, hemoglobin A1c, lipids.   Ena Dawley 01/12/2016

## 2016-01-16 ENCOUNTER — Telehealth: Payer: Self-pay | Admitting: *Deleted

## 2016-01-16 DIAGNOSIS — R7989 Other specified abnormal findings of blood chemistry: Secondary | ICD-10-CM

## 2016-01-16 NOTE — Telephone Encounter (Signed)
Notified the pt that per Dr Meda Coffee, her labs showed normal HbA1c, normal cbc, normal cmet and lipids.  Did inform the pt that per Dr Meda Coffee, her TSH remains borderline, she should follow with an endocrinology. Informed the pt that I will place the referral in the system, and have our [facility] schedulers correlate this appt with Endocrinology office, to schedule her a new pt consult.  Pt verbalized understanding and agrees with this plan.

## 2016-01-16 NOTE — Telephone Encounter (Signed)
-----   Message from Dorothy Spark, MD sent at 01/16/2016  9:50 AM EST ----- Normal HbA1c, TSH remains borderline, she should follow with an endocrinology  Normal CBC, CMP, lipids.

## 2016-01-26 ENCOUNTER — Other Ambulatory Visit: Payer: Self-pay | Admitting: Internal Medicine

## 2016-01-26 DIAGNOSIS — R7989 Other specified abnormal findings of blood chemistry: Secondary | ICD-10-CM

## 2016-02-06 ENCOUNTER — Encounter (HOSPITAL_COMMUNITY)
Admission: RE | Admit: 2016-02-06 | Discharge: 2016-02-06 | Disposition: A | Discharge status: Home / Self Care | Payer: BLUE CROSS/BLUE SHIELD | Source: Ambulatory Visit | Attending: Internal Medicine | Admitting: Internal Medicine

## 2016-02-06 DIAGNOSIS — R946 Abnormal results of thyroid function studies: Secondary | ICD-10-CM | POA: Insufficient documentation

## 2016-02-06 DIAGNOSIS — R7989 Other specified abnormal findings of blood chemistry: Secondary | ICD-10-CM

## 2016-02-07 ENCOUNTER — Encounter (HOSPITAL_COMMUNITY)
Admission: RE | Admit: 2016-02-07 | Discharge: 2016-02-07 | Disposition: A | Discharge status: Home / Self Care | Payer: BLUE CROSS/BLUE SHIELD | Source: Ambulatory Visit | Attending: Internal Medicine | Admitting: Internal Medicine

## 2016-02-07 DIAGNOSIS — R946 Abnormal results of thyroid function studies: Secondary | ICD-10-CM | POA: Diagnosis not present

## 2016-02-07 MED ORDER — SODIUM PERTECHNETATE TC 99M INJECTION
10.0000 | Freq: Once | INTRAVENOUS | Status: AC | PRN
  Administered 2016-02-07: 10 via INTRAVENOUS

## 2016-02-07 MED ORDER — SODIUM IODIDE I 131 CAPSULE
10.0000 | Freq: Once | INTRAVENOUS | Status: AC | PRN
  Administered 2016-02-07: 10 via ORAL

## 2016-03-06 ENCOUNTER — Telehealth: Payer: Self-pay | Admitting: *Deleted

## 2016-03-06 ENCOUNTER — Ambulatory Visit (INDEPENDENT_AMBULATORY_CARE_PROVIDER_SITE_OTHER): Payer: BLUE CROSS/BLUE SHIELD | Admitting: Family Medicine

## 2016-03-06 ENCOUNTER — Ambulatory Visit (HOSPITAL_COMMUNITY)
Admission: RE | Admit: 2016-03-06 | Discharge: 2016-03-06 | Disposition: A | Discharge status: Home / Self Care | Payer: BLUE CROSS/BLUE SHIELD | Source: Ambulatory Visit | Attending: Family Medicine | Admitting: Family Medicine

## 2016-03-06 ENCOUNTER — Ambulatory Visit: Payer: BLUE CROSS/BLUE SHIELD | Admitting: Internal Medicine

## 2016-03-06 VITALS — BP 80/65 | HR 99 | Temp 98.2°F | Ht 63.0 in | Wt 122.6 lb

## 2016-03-06 DIAGNOSIS — R5381 Other malaise: Secondary | ICD-10-CM | POA: Diagnosis not present

## 2016-03-06 DIAGNOSIS — R55 Syncope and collapse: Secondary | ICD-10-CM | POA: Insufficient documentation

## 2016-03-06 DIAGNOSIS — R6889 Other general symptoms and signs: Secondary | ICD-10-CM | POA: Diagnosis not present

## 2016-03-06 DIAGNOSIS — R9431 Abnormal electrocardiogram [ECG] [EKG]: Secondary | ICD-10-CM | POA: Insufficient documentation

## 2016-03-06 LAB — CBC
HEMATOCRIT: 44.3 % (ref 35.0–45.0)
HEMOGLOBIN: 14.5 g/dL (ref 11.7–15.5)
MCH: 27.7 pg (ref 27.0–33.0)
MCHC: 32.7 g/dL (ref 32.0–36.0)
MCV: 84.5 fL (ref 80.0–100.0)
MPV: 11.9 fL (ref 7.5–12.5)
Platelets: 245 10*3/uL (ref 140–400)
RBC: 5.24 MIL/uL — ABNORMAL HIGH (ref 3.80–5.10)
RDW: 14 % (ref 11.0–15.0)
WBC: 5.8 10*3/uL (ref 3.8–10.8)

## 2016-03-06 LAB — COMPLETE METABOLIC PANEL WITH GFR
ALT: 14 U/L (ref 6–29)
AST: 16 U/L (ref 10–35)
Albumin: 4.4 g/dL (ref 3.6–5.1)
Alkaline Phosphatase: 83 U/L (ref 33–115)
BUN: 7 mg/dL (ref 7–25)
CALCIUM: 9.7 mg/dL (ref 8.6–10.2)
CHLORIDE: 105 mmol/L (ref 98–110)
CO2: 27 mmol/L (ref 20–31)
Creat: 1.04 mg/dL (ref 0.50–1.10)
GFR, Est African American: 73 mL/min (ref >=60)
GFR, Est Non African American: 63 mL/min (ref >=60)
GLUCOSE: 98 mg/dL (ref 65–99)
POTASSIUM: 3.9 mmol/L (ref 3.5–5.3)
SODIUM: 143 mmol/L (ref 135–146)
Total Bilirubin: 0.5 mg/dL (ref 0.2–1.2)
Total Protein: 7.9 g/dL (ref 6.1–8.1)

## 2016-03-06 LAB — TSH: TSH: 0.28 mIU/L — ABNORMAL LOW

## 2016-03-06 LAB — INFLUENZA PANEL BY PCR (TYPE A & B)
INFLAPCR: NEGATIVE
Influenza B By PCR: NEGATIVE

## 2016-03-06 MED ORDER — OSELTAMIVIR PHOSPHATE 75 MG PO CAPS: 75.0000 mg | ORAL_CAPSULE | Freq: Two times a day (BID) | ORAL | 0 refills | Status: DC

## 2016-03-06 NOTE — Telephone Encounter (Signed)
Dr Rayann Heman received DOD call in regards to patient

## 2016-03-06 NOTE — Patient Instructions (Addendum)
Thank you so much for coming to visit today! I have spoken with another Cardiologist at Dr. Francesca Oman office. They recommend plenty of fluids for suspected dehydration. We will check several labs today and contact you with the results. We will start treatment for flu with Tamiflu--even if the test for flu returns as negative I recommend completing the course. Please hold her Lasix and Coreg at this time. Follow up with your Cardiologist over the next several days. If you develop shortness of breath, dizziness, palpitations, chest pain, or experience other episodes of syncope, please call 911.   Dr. Gerlean Ren

## 2016-03-07 NOTE — Progress Notes (Signed)
Subjective:     Patient ID: [name] [name], female   DOB: 1965-11-02, 51 y.o.   MRN: [id number]  HPI [name] [name] is a 51yo female presenting today for syncope and flu-like symptoms. Reports episode of syncope yesterday while at work. Denies any symptoms prior to syncope. Woke up suddenly on the ground with people standing over her. Denies shortness of breath, chest pain, or palpitations. Denies shaking, urinary/fecal incontinence, confusion following episode. Denies any further episodes of dizziness or syncope. Also of note, yesterday following episode she developed congestion, headache, body aches, nausea, and diarrhea. Denies fever. Symptoms seemed gradual in onset. Denies sick contacts. Has not received flu shot. Has been taking OTC cold medicine with some relief. Also of note, started new thyroid medication (Methimazole) last week. History of MI in 2015, Pericarditis for which she is still being treated in November 2017. Follows with [name] [name] for Cardiology. Former Smoker  Review of Systems Per HPI    Objective:   Physical Exam  Constitutional: She appears well-developed and well-nourished. No distress.  HENT:  Head: Normocephalic and atraumatic.  Moist mucus membranes  Neck: No thyromegaly present.  Cardiovascular: Normal rate and regular rhythm.   No murmur heard. Pulmonary/Chest: Effort normal. No respiratory distress. She has no wheezes.  Abdominal: Soft. She exhibits no distension. There is no tenderness.  Musculoskeletal: She exhibits no edema.  Lymphadenopathy:    She has no cervical adenopathy.  Skin: No rash noted.  Psychiatric: She has a normal mood and affect. Her behavior is normal.      Assessment and Plan:     1. Syncope, unspecified syncope type Concerning for cardiac etiology given lack of pre-syncopal symptoms and history of MI and pericarditis. Given option to go to ED or check labs/EKG and follow up closely with Cardiology. Asks that I contact Cardiology to ask  their opinion, but that she would prefer to follow up with them as outpatient. [name] [name] contacted at [facility] and suspects related to flu like symptoms and dehydration described below. Encourage hydration and obtain EKG at office. EKG normal and unchanged (Note leads were reversed in initial EKG obtained, please refer to second EKG). Will hold lasix and Coreg due to low blood pressure today. Orthostatic vitals signs: 100/60 lying, 80/60 sitting, 110/60 standing. Will obtain CMP, CBC, and TSH today. Follow up with Cardiology over next several days. Discussed risk of not going to ED, including further episodes of syncope and possible poor outcomes including death--to call 911 or go to ED if chest pain, shortness of breath, palpitations, further episodes of syncope occur. Patient agrees and understands plan.  2. Flu-like symptoms Concerning for flu. Will swab for flu today. Initiate Tamiflu at treatment dose and continue even if swab negative. Dehydrated today--encouraged fluids.   Precepted with Dr. Ree Kida.

## 2016-03-12 ENCOUNTER — Telehealth: Payer: Self-pay | Admitting: Internal Medicine

## 2016-03-12 NOTE — Telephone Encounter (Signed)
Abnormal labs, will forward to ordering MD.

## 2016-03-12 NOTE — Telephone Encounter (Signed)
Pt would like someone to call her regarding lab results. ep °

## 2016-03-13 ENCOUNTER — Encounter: Payer: Self-pay | Admitting: Family Medicine

## 2016-03-13 NOTE — Telephone Encounter (Signed)
Contacted to discuss lab results. Had already initiated treatment for thyroid abnormality in 1-2 weeks prior to visit--TSH checked to evaluate for acute change that may explain symptoms but this does not seem to have happened. States she is feeling better. Has appointment schedule with Cardiology.

## 2016-03-28 ENCOUNTER — Encounter: Payer: Self-pay | Admitting: Internal Medicine

## 2016-04-05 ENCOUNTER — Telehealth: Payer: Self-pay | Admitting: Cardiology

## 2016-04-05 NOTE — Telephone Encounter (Signed)
New Message     Pt is calling back regarding her lab results

## 2016-04-05 NOTE — Telephone Encounter (Signed)
Notes Recorded by Nuala Alpha, LPN on 7/0/1779 at 3:90 AM EST Notified the pt of normal TSH and T3 per Dr Meda Coffee, that was scanned in the system for her to review by Dr. Jeanann Lewandowsky.  Pt verbalized understanding. ------  Notes Recorded by Nuala Alpha, LPN on 3/0/0923 at 3:00 PM EST Attempted to call the pt to endorse normal scanned labs per Dr Meda Coffee. Pts VM is not set-up. ------  Notes Recorded by Dorothy Spark, MD on 04/04/2016 at 5:03 PM EST Normal TSH and T3

## 2016-04-15 ENCOUNTER — Ambulatory Visit (INDEPENDENT_AMBULATORY_CARE_PROVIDER_SITE_OTHER): Payer: BLUE CROSS/BLUE SHIELD | Admitting: Cardiology

## 2016-04-15 ENCOUNTER — Encounter: Payer: Self-pay | Admitting: Cardiology

## 2016-04-15 ENCOUNTER — Encounter (INDEPENDENT_AMBULATORY_CARE_PROVIDER_SITE_OTHER): Payer: Self-pay

## 2016-04-15 VITALS — BP 110/60 | HR 82 | Ht 63.0 in | Wt 123.0 lb

## 2016-04-15 DIAGNOSIS — I309 Acute pericarditis, unspecified: Secondary | ICD-10-CM | POA: Diagnosis not present

## 2016-04-15 DIAGNOSIS — E785 Hyperlipidemia, unspecified: Secondary | ICD-10-CM

## 2016-04-15 DIAGNOSIS — I2510 Atherosclerotic heart disease of native coronary artery without angina pectoris: Secondary | ICD-10-CM | POA: Diagnosis not present

## 2016-04-15 DIAGNOSIS — I252 Old myocardial infarction: Secondary | ICD-10-CM

## 2016-04-15 DIAGNOSIS — I2583 Coronary atherosclerosis due to lipid rich plaque: Secondary | ICD-10-CM

## 2016-04-15 DIAGNOSIS — I214 Non-ST elevation (NSTEMI) myocardial infarction: Secondary | ICD-10-CM

## 2016-04-15 DIAGNOSIS — I10 Essential (primary) hypertension: Secondary | ICD-10-CM | POA: Diagnosis not present

## 2016-04-15 NOTE — Progress Notes (Signed)
Patient ID: [name] [name], female   DOB: [birth year], 51 y.o.   MRN: [id number]    [name] [name] Date of Birth: [birth year] Medical Record #[id number]   Chief complain: 6 months follow up  History of Present Illness: [name] [name] is seen back today for a work in visit. Seen for Dr. Meda Coffee. 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 She did not require PCI. EF was normal. Other issues as noted. Has been in cardiac rehab and did well.  Comes back today. Here alone. She states that her shortness of breath and lower extremity edema has resolved. She has been taking Lasix 20 mg daily as needed. She denies any arm chest pain, orthopnea, PND. No syncope. She states that occasionally she will have a few second lasting palpitations they're not associated with any other symptoms such as dizziness or shortness of breath.  Sister did accompany her to the office but not to the room due to last OV note mentioning that she did not wish to have substance abuse issues discussed with anyone else present.   11/03/2015 - the patient is coming after 6 months, she continues to deal with poor social situation where she [place] very dangerous neighborhood and is trying to be transferred, the previous sleeve rolled her supportive paperwork for the transfer. She's been experiencing upper respiratory infection and some chest pain while coughing or on deep inspiration also inserted in position in bed. She ran out of colchicine in the past as she couldn't afford it. She otherwise denies exertional chest pain or shortness of breath. She also states that she has been experiencing palpitations every day up to few minutes without associated chest, they occur every day and sometimes several times a day. No associated dizziness or syncope.  01/10/2016, patient is coming after 2 months, she states that her cultures definitely helps, but she, take it twice a day she developed significant diarrhea. She wants to continue taking  colchicine. She denies any exertional chest pain shortness of breath palpitations or syncope. She has no lower extremity edema or claudications. She has been compliant to her meds. She takes furosemide rarely. She has developed cough in the last couple weeks that is nonproductive now improving on Mucinex.  04/15/2016, the patient is coming after 3 months, she stitches she feels great, colchicine completely resolved her chest pain and she is asymptomatic. 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 She also denies any palpitations or syncope. She has been compliant with her medications and denies any side effects.    Current Outpatient Prescriptions  Medication Sig Dispense Refill  . acetaminophen (TYLENOL) 500 MG tablet Take 1,000 mg by mouth every 6 (six) hours as needed for mild pain.    Marland Kitchen albuterol (PROAIR HFA) 108 (90 Base) MCG/ACT inhaler Inhale 2 puffs into the lungs every 4 (four) hours as needed for wheezing or shortness of breath. 1 Inhaler 0  . aspirin EC 81 MG EC tablet Take 1 tablet (81 mg total) by mouth daily.    . budesonide-formoterol (SYMBICORT) 160-4.5 MCG/ACT inhaler Inhale 2 puffs into the lungs 2 (two) times daily. 1 Inhaler 1  . carvedilol (COREG) 3.125 MG tablet Take 1 tablet (3.125 mg total) by mouth 2 (two) times daily with a meal. 180 tablet 8  . clopidogrel (PLAVIX) 75 MG tablet Take 1 tablet (75 mg total) by mouth daily with breakfast. 90 tablet 8  .  furosemide (LASIX) 20 MG tablet Take 1 tablet (20 mg total) by mouth daily as needed for fluid or edema. 90 tablet 3  . loratadine (CLARITIN) 10 MG tablet Take 10 mg by mouth daily.    . nitroGLYCERIN (NITROSTAT) 0.4 MG SL tablet DISSOLVE ONE TABLET UNDER THE TONGUE EVERY 5 MINUTES AS NEEDED FOR CHEST PAIN.  DO NOT EXCEED A TOTAL OF 3 DOSES IN 15 MINUTES 25 tablet 6  . potassium chloride (K-DUR) 10 MEQ tablet Take 1 tablet (10 mEq total) by mouth daily as needed.  Take only when you take your PRN lasix 30 tablet 3  . pravastatin (PRAVACHOL) 40 MG tablet Take 1 tablet (40 mg total) by mouth daily. 90 tablet 6   No current facility-administered medications for this visit.     Allergies  Allergen Reactions  . Demerol [Meperidine] Itching  . Latex Itching and Rash    gloves   . Novocain [Procaine Hcl] Swelling    Past Medical History:  Diagnosis Date  . Abnormal Pap smear    Colpo (twice)  . Anemia   . Anxiety and depression 01/01/2014  . Breast disorder    soreness in left breast   . CAD (coronary artery disease)    a. 01/2013 NSTEMI/Cath: LM nl, LAD 57m, LCX nl, RI nl, RCA nl, PDA ostial nonocclusive intraluminal thrombus, EF 65%, Treated with heparin x 48 hrs->Med Rx (occurred in setting of + cocaine);  b. 01/2013 Echo: EF 60-65%, nl wall motion.  . Chlamydia    years ago  . Depression   . Heart disease    MI Jan 2015  . Myocardial infarction   . Polysubstance abuse    tobacco/cocaine/marijuana (+ UDS 01/2013).  . Trichomonas    years ago    Past Surgical History:  Procedure Laterality Date  . BREAST SURGERY     lump removed from right breast  . CERVICAL CONE BIOPSY    . CESAREAN SECTION     2 c-sections  . ECTOPIC PREGNANCY SURGERY    . ENDOMETRIAL ABLATION W/ NOVASURE    . GROIN MASS OPEN BIOPSY    . LEFT HEART CATHETERIZATION WITH CORONARY ANGIOGRAM N/A 02/08/2013   Procedure: LEFT HEART CATHETERIZATION WITH CORONARY ANGIOGRAM;  Surgeon: Sinclair Grooms, MD;  Location: [facility] C[facility] LAB;  Service: Cardiovascular;  Laterality: N/A;  . TUBAL LIGATION      History  Smoking Status  . Former Smoker  . Packs/day: 0.50  . Years: 31.00  . Types: Cigarettes  . Quit date: 02/05/2013  Smokeless Tobacco  . Never Used    History  Alcohol Use  . 0.0 oz/week    Comment: rarely    Family History  Problem Relation Age of Onset  . Breast cancer Mother   . Hypertension Brother   . Kidney failure Brother   . Hypertension Sister      Review of Systems: The review of systems is per the HPI.  All other systems were reviewed and are negative.  Physical Exam: BP 110/60   Pulse 82   Ht 5\' 3"  (1.6 m)   Wt 123 lb (55.8 kg)   SpO2 97%   BMI 21.79 kg/m  Patient is pleasant and in no acute distress. Skin is warm and dry. Color is normal.  HEENT is unremarkable. Normocephalic/atraumatic. PERRL. Sclera are nonicteric. Neck is supple. No masses. No JVD. Lungs are clear. Cardiac exam shows a regular rate and rhythm. Abdomen is soft. Extremities Have no lower extremity  edema. Gait and ROM are intact. No gross neurologic deficits noted.  Wt Readings from Last 3 Encounters:  04/15/16 123 lb (55.8 kg)  03/06/16 122 lb 9.6 oz (55.6 kg)  01/12/16 124 lb (56.2 kg)    LABORATORY DATA: PENDING  Lab Results  Component Value Date   WBC 5.8 03/06/2016   HGB 14.5 03/06/2016   HCT 44.3 03/06/2016   PLT 245 03/06/2016   GLUCOSE 98 03/06/2016   CHOL 174 01/12/2016   TRIG 81 01/12/2016   HDL 45 (L) 01/12/2016   LDLCALC 113 (H) 01/12/2016   ALT 14 03/06/2016   AST 16 03/06/2016   NA 143 03/06/2016   K 3.9 03/06/2016   CL 105 03/06/2016   CREATININE 1.04 03/06/2016   BUN 7 03/06/2016   CO2 27 03/06/2016   TSH 0.28 (L) 03/06/2016   INR 1.13 02/06/2013   HGBA1C 5.8 (H) 01/12/2016    Exercise nuclear stress test: 11/10/2013 Impression Exercise Capacity: Good exercise capacity. BP Response: Hypotensive blood pressure response. Clinical Symptoms: There is dyspnea. ECG Impression: No significant ST segment change suggestive of ischemia. Comparison with Prior Nuclear Study: No images to compare  Overall Impression: Low risk stress nuclear study with a small sized, mild fixed mid and basal inferior defect that is most consistent with diaphragmatic attenuation and increased gut uptake. No evidence of ischemia. Of note the patient's BP was 116/62mmHg in stage 3 and decreased to 140/28mmHg in Stage 2. The next BP was in  recovery at 173/55mmHg.  LV Ejection Fraction: 63%. LV Wall Motion: NL LV Function; NL Wall Motion  EGD performed today 11/03/2015 shows normal sinus rhythm and normal EKG.    Assessment / Plan:  1. Recurrent acute pericarditis - Improved with colchicine 0.6 g for 3 months. We will discontinue.there is no rub on physical exam.  2. Respiratory infection with a cough, most probably viral bronchitis - resolved.  3. CAD, h/o NSTEMI in January 2015- with nonocclusive intraluminal thrombus - managed with heparin, no stent was placed. She currently has no angina, no ischemic workup needed at this time. We will continue dual antiplatelet therapy. Repeat echocardiogram on 04/03/2015 showed normal LVEF and normal diastolic function and only trivial for tricuspid regurgitation otherwise normal echocardiogram.  4. LE Swelling - resolved, continue Lasix 20 mg daily when necessary. Creatinine normal in February 2018.  5. HLD - continue pravastatin 40 mg daily that she is tolerating well. In December 2017 HDL 45, triglycerides 81, LDL 113. She is advised to improve her diet and increase exercise level.  Follow up in 6 months.    Ena Dawley 04/15/2016

## 2016-04-15 NOTE — Patient Instructions (Signed)
Medication Instructions:  Stop colchicine  Labwork: None   Testing/Procedures: None   Follow-Up: Your physician wants you to follow-up in: 6 months with Dr Meda Coffee. (September 2018).You will receive a reminder letter in the mail two months in advance. If you don't receive a letter, please call our office to schedule the follow-up appointment.      If you need a refill on your cardiac medications before your next appointment, please call your pharmacy.

## 2016-05-28 ENCOUNTER — Telehealth: Payer: Self-pay | Admitting: Cardiology

## 2016-05-28 NOTE — Telephone Encounter (Signed)
New message       Patient c/o Palpitations:  High priority if patient c/o lightheadedness and shortness of breath.  1. How long have you been having palpitations?  1-2 weeks ago 2. Are you currently experiencing lightheadedness and shortness of breath? dizziness 3. Have you checked your BP and heart rate? (document readings)  no  4. Are you experiencing any other symptoms?  No Pt states that she want to go back on colchicine.  Dr Meda Coffee stopped medication in march.  Please call

## 2016-05-28 NOTE — Telephone Encounter (Signed)
Pt calling to report to Dr Meda Coffee that over the past couple of days, she has been experiencing some palpitations.  Pt states that she feels a "fluttering sensation" in her chest that is intermittent.  Pt states its not chest pain, just "fluttering." Pt reports that on one occasion, it woke her out of her sleep, and took her breath away for a short period of time.  Pt reports one episode of dizziness when she experienced palpitations, but she hydrated herself, went indoors, and felt much better.  Pt states she feels like this could probably be related to her allergies and pollen.  Pt reports that she feels colchicine would help with this, despite not complaining of any chest pain.  Pt states she took colchicine at one time for acute pericarditis.  Informed the pt that Dr Meda Coffee is out of the office today, but I will forward her concerns for further review and recommendation and I will follow-up with the pt shortly thereafter.  Advised the pt that in the meantime, increase her fluid intake, limit her caffeine, decaff, chocolate intake, and avoid nicotine.  Pt verbalized understanding and agrees with this plan.

## 2016-05-30 NOTE — Telephone Encounter (Signed)
Patient returning your call, thanks. °

## 2016-05-30 NOTE — Telephone Encounter (Signed)
Tried calling the pt to endorse recommendations per Dr Meda Coffee and no answer, with no VM set-up.

## 2016-05-30 NOTE — Telephone Encounter (Signed)
Made pt aware that per Dr Meda Coffee, she recommends that she stay hydrated, limit caffeine, and no colchicine needed at this time.  Informed the pt that per Dr Meda Coffee, if she starts having symptomatic palpitations with dizziness, sob, and chest pain, then she should notify our office.  Pt verbalized understanding and agrees with this plan.

## 2016-05-30 NOTE — Telephone Encounter (Signed)
Please advise to stay hydrated, limit caffeinated drinks, she can d/c colchicine. Tell her to call us if her palpitations symptomatic with dizziness, SOB or chest pain.

## 2016-05-31 ENCOUNTER — Other Ambulatory Visit: Payer: Self-pay | Admitting: Obstetrics and Gynecology

## 2016-05-31 DIAGNOSIS — Z1231 Encounter for screening mammogram for malignant neoplasm of breast: Secondary | ICD-10-CM

## 2016-06-17 ENCOUNTER — Telehealth: Payer: Self-pay | Admitting: Cardiology

## 2016-06-17 NOTE — Telephone Encounter (Signed)
Spoke back with the pt and informed her that no further instructions were given by Dr Meda Coffee, just to follow instructions her and I discussed earlier. Pt verbalized understanding and agrees with this plan.

## 2016-06-17 NOTE — Telephone Encounter (Signed)
Pt calling to inform Dr Meda Coffee that she will be flying out on a trip tomorrow, and the duration of her flight will be 4 hours, non-stop.  Pt wanted to know if this is contraindicated from a cardiac standpoint.  Educated the pt that she should stay plenty hydrated, take all her meds with her, and get up and walk in the cabin, once the seat belt sign permits so. Informed the pt that getting up and walking around every hour promotes good circulation.  Informed the pt that I will still route this message to Dr Meda Coffee to review and advise on if needed, and follow-up with the pt accordingly thereafter.  Pt verbalized understanding and agrees with this plan.

## 2016-06-17 NOTE — Telephone Encounter (Signed)
No other special instruction other than what you said

## 2016-06-17 NOTE — Telephone Encounter (Signed)
New Message  Pt call requesting to speak with RN about a flight she will be taking tomorrow. Pt would like to know would it be okay for her to flight for 4 hours. Please call back to discuss

## 2016-06-28 ENCOUNTER — Telehealth: Payer: Self-pay | Admitting: Cardiology

## 2016-06-28 NOTE — Telephone Encounter (Signed)
Pt is calling to report to Dr Meda Coffee that she went in to see her PCP Dr. Carlis Abbott, for follow-up with her thyroid.  Pt has abnormal thyroid function, and is unfortunately not responding to several different meds her PCP has put her own for this.  Pt states that she didn't respond to synthroid, so she was recently switched to Thiamazole and is now not responding to this med.  Pt states that Dr Carlis Abbott wants her to hold Thiamazole x 2 weeks, and then have Iodine treatment thereafter. Pt states that Dr Carlis Abbott will soon fax his progress notes to Dr Meda Coffee for her review.  Pt states that she is extremely worried about iodine treatment.   Pt is worried it will affect her heart and heart medications.  Pt states she would feel more relieved if Dr Meda Coffee would advise if this treatment option is safe with her heart history and meds taking. Informed the pt that Dr Meda Coffee is out of the office today, but I will route this message to her for further review, recommendation, and follow-up with the pt accordingly thereafter.  Pt verbalized understanding and agrees with this plan.

## 2016-06-28 NOTE — Telephone Encounter (Signed)
Yes it is safe and necessary

## 2016-06-28 NOTE — Telephone Encounter (Signed)
New Message  Pt voiced returning nurses call and would like for nurse to give her a call back.

## 2016-06-28 NOTE — Telephone Encounter (Signed)
Dr. Meda Coffee, I have records from Dr Carlis Abbott on this pt.  She needs Iodine treatment for recent findings on her NM Thyroid Uptake w Imaging scan.  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  Would you recommend that she proceed with his recommendation to have iodine treatment or get a second opinion? Is this treatment regimen safe with her cardiac hx and cardiac meds taking?

## 2016-06-28 NOTE — Telephone Encounter (Signed)
She can go for a second opinion to Dr Dorris Fetch in [place].

## 2016-06-28 NOTE — Telephone Encounter (Signed)
Left a message for the pt to call back to endorse recommendations per Dr Nelson. 

## 2016-06-28 NOTE — Telephone Encounter (Signed)
Notified the pt of Dr Francesca Oman recommendations.  Per the pt, she states that Dr Carlis Abbott called her today and wants to stop her methimazole 5 mg po daily x 1 week, then decrease her methimazole to 2.5 mg po daily thereafter.  Pt will get repeat labs, to check TSH in 6 weeks.  Regimen has been changed for known low thyroid levels.  Pt states she will update [place] accordingly.  Pt gracious for all the assistance provided.

## 2016-06-28 NOTE — Telephone Encounter (Signed)
She needs to be referred to an endocrinologist ASAP

## 2016-06-28 NOTE — Telephone Encounter (Signed)
Correction, Dr. Carlis Abbott is Endocrinology not PCP.  I'm sorry for the miscommunication, for the pt stated this was her PCP.

## 2016-07-11 ENCOUNTER — Other Ambulatory Visit: Payer: Self-pay | Admitting: Internal Medicine

## 2016-07-11 ENCOUNTER — Ambulatory Visit
Admission: RE | Admit: 2016-07-11 | Discharge: 2016-07-11 | Disposition: A | Discharge status: Home / Self Care | Payer: BLUE CROSS/BLUE SHIELD | Source: Ambulatory Visit | Attending: Obstetrics and Gynecology | Admitting: Obstetrics and Gynecology

## 2016-07-11 DIAGNOSIS — N63 Unspecified lump in unspecified breast: Secondary | ICD-10-CM

## 2016-07-11 DIAGNOSIS — Z1231 Encounter for screening mammogram for malignant neoplasm of breast: Secondary | ICD-10-CM

## 2016-07-15 ENCOUNTER — Encounter (HOSPITAL_BASED_OUTPATIENT_CLINIC_OR_DEPARTMENT_OTHER): Payer: Self-pay | Admitting: Emergency Medicine

## 2016-07-15 ENCOUNTER — Emergency Department (HOSPITAL_BASED_OUTPATIENT_CLINIC_OR_DEPARTMENT_OTHER)
Admission: EM | Admit: 2016-07-15 | Discharge: 2016-07-15 | Disposition: A | Discharge status: Home / Self Care | Payer: BLUE CROSS/BLUE SHIELD | Attending: Emergency Medicine | Admitting: Emergency Medicine

## 2016-07-15 ENCOUNTER — Emergency Department (HOSPITAL_BASED_OUTPATIENT_CLINIC_OR_DEPARTMENT_OTHER): Payer: BLUE CROSS/BLUE SHIELD

## 2016-07-15 DIAGNOSIS — Z91040 Latex allergy status: Secondary | ICD-10-CM | POA: Diagnosis not present

## 2016-07-15 DIAGNOSIS — J449 Chronic obstructive pulmonary disease, unspecified: Secondary | ICD-10-CM | POA: Insufficient documentation

## 2016-07-15 DIAGNOSIS — I119 Hypertensive heart disease without heart failure: Secondary | ICD-10-CM | POA: Diagnosis not present

## 2016-07-15 DIAGNOSIS — Z7982 Long term (current) use of aspirin: Secondary | ICD-10-CM | POA: Insufficient documentation

## 2016-07-15 DIAGNOSIS — Z7902 Long term (current) use of antithrombotics/antiplatelets: Secondary | ICD-10-CM | POA: Diagnosis not present

## 2016-07-15 DIAGNOSIS — Z79899 Other long term (current) drug therapy: Secondary | ICD-10-CM | POA: Insufficient documentation

## 2016-07-15 DIAGNOSIS — M5442 Lumbago with sciatica, left side: Secondary | ICD-10-CM | POA: Diagnosis not present

## 2016-07-15 DIAGNOSIS — M545 Low back pain: Secondary | ICD-10-CM | POA: Diagnosis present

## 2016-07-15 DIAGNOSIS — I10 Essential (primary) hypertension: Secondary | ICD-10-CM | POA: Diagnosis not present

## 2016-07-15 DIAGNOSIS — Z87891 Personal history of nicotine dependence: Secondary | ICD-10-CM | POA: Diagnosis not present

## 2016-07-15 DIAGNOSIS — I2510 Atherosclerotic heart disease of native coronary artery without angina pectoris: Secondary | ICD-10-CM | POA: Insufficient documentation

## 2016-07-15 HISTORY — DX: Pure hypercholesterolemia, unspecified: E78.00

## 2016-07-15 HISTORY — DX: Disorder of thyroid, unspecified: E07.9

## 2016-07-15 HISTORY — DX: Essential (primary) hypertension: I10

## 2016-07-15 MED ORDER — OXYCODONE-ACETAMINOPHEN 5-325 MG PO TABS
1.0000 | ORAL_TABLET | Freq: Once | ORAL | Status: AC
  Administered 2016-07-15: 1 via ORAL
  Filled 2016-07-15: qty 1

## 2016-07-15 MED ORDER — LIDOCAINE 5 % EX PTCH: 1.0000 | MEDICATED_PATCH | CUTANEOUS | 0 refills | Status: DC

## 2016-07-15 MED ORDER — HYDROCODONE-ACETAMINOPHEN 5-325 MG PO TABS: 1.0000 | ORAL_TABLET | Freq: Four times a day (QID) | ORAL | 0 refills | Status: DC | PRN

## 2016-07-15 MED ORDER — METHOCARBAMOL 500 MG PO TABS: 500.0000 mg | ORAL_TABLET | Freq: Two times a day (BID) | ORAL | 0 refills | Status: DC

## 2016-07-15 MED FILL — METHOCARBAMOL 500 MG TABLET: 500 | 10 days supply | Qty: 20 | Fill #0

## 2016-07-15 MED FILL — HYDROCODON-APAP 5-325: 5-325 | 3 days supply | Qty: 10 | Fill #0

## 2016-07-15 NOTE — ED Notes (Signed)
ED Provider at bedside. 

## 2016-07-15 NOTE — ED Provider Notes (Signed)
[facility] Provider Note   CSN: [id number] Arrival date & time: 07/15/16  5784     History   Chief Complaint Chief Complaint  Patient presents with  . Back Pain    HPI [name] [name] is a 51 y.o. female.  HPI  [name] [name] is a 51 y.o. female, with a history of MI, polysubstance abuse, and anemia, presenting to the ED with Lower back pain for the past 4 days. Pain is described as a tightness and aching, 10 out of 10, radiating into the left buttock. She has tried Tylenol and ibuprofen without relief. States pain is worsening since onset. Patient endorses a fall that occurred the day prior to back pain onset. States she got overheated outside, lost her balance, and fell to the ground.   Patient denies weakness, numbness, neck pain, LOC, headache, nausea/vomiting, subsequent falls or trauma, or any other complaints.   Patient states she is in menopause.   Past Medical History:  Diagnosis Date  . Abnormal Pap smear    Colpo (twice)  . Anemia   . Anxiety and depression 01/01/2014  . Breast disorder    soreness in left breast   . CAD (coronary artery disease)    a. 01/2013 NSTEMI/Cath: LM nl, LAD 23m, LCX nl, RI nl, RCA nl, PDA ostial nonocclusive intraluminal thrombus, EF 65%, Treated with heparin x 48 hrs->Med Rx (occurred in setting of + cocaine);  b. 01/2013 Echo: EF 60-65%, nl wall motion.  . Chlamydia    years ago  . Depression   . Heart disease    MI Jan 2015  . High cholesterol   . Hypertension   . Myocardial infarction ([place])   . Polysubstance abuse    tobacco/cocaine/marijuana (+ UDS 01/2013).  . Thyroid disease   . Trichomonas    years ago    Patient Active Problem List   Diagnosis Date Noted  . Decreased thyroid stimulating hormone (TSH) level 01/16/2016  . History of ear pain 12/11/2015  . Impacted cerumen of right ear 12/11/2015  . Ear pain, right 12/01/2015  . Encounter for screening examination for sexually transmitted disease 09/26/2015    . Cocaine abuse 04/26/2015  . Hemoptysis 03/03/2015  . Elevated hemoglobin A1c 03/01/2015  . Concern about STD in female without diagnosis 03/01/2015  . Acute purulent otitis media 03/01/2015  . Atypical chest pain 03/01/2015  . TIA (transient ischemic attack) 03/01/2015  . Stable angina ([facility])   . Essential hypertension   . Asthma, chronic   . Anxiety and depression 01/01/2014  . COPD (chronic obstructive pulmonary disease) ([place]) 06/03/2013  . Hyperlipidemia 02/11/2013  . CAD (coronary artery disease) 02/11/2013  . Tobacco abuse 02/10/2013  . Polysubstance abuse:  Cocaine, marijuana 02/10/2013  . Subclinical hyperthyroidism 02/10/2013  . Breast lump on right side at 9 o'clock position 10/13/2012  . Breast lump on left side at 3 o'clock position 06/09/2012    Past Surgical History:  Procedure Laterality Date  . BREAST SURGERY     lump removed from right breast  . CERVICAL CONE BIOPSY    . CESAREAN SECTION     2 c-sections  . ECTOPIC PREGNANCY SURGERY    . ENDOMETRIAL ABLATION W/ NOVASURE    . GROIN MASS OPEN BIOPSY    . LEFT HEART CATHETERIZATION WITH CORONARY ANGIOGRAM N/A 02/08/2013   Procedure: LEFT HEART CATHETERIZATION WITH CORONARY ANGIOGRAM;  Surgeon: Sinclair Grooms, MD;  Location[facility] CATH LAB;  Service: Cardiovascular;  Laterality: N/A;  .  TUBAL LIGATION      OB History    Gravida Para Term Preterm AB Living   4 2 2   2 2    SAB TAB Ectopic Multiple Live Births   1   1           Home Medications    Prior to Admission medications   Medication Sig Start Date End Date Taking? Authorizing Provider  albuterol (PROAIR HFA) 108 (90 Base) MCG/ACT inhaler Inhale 2 puffs into the lungs every 4 (four) hours as needed for wheezing or shortness of breath. 12/06/15  Yes Collene Gobble, MD  aspirin EC 81 MG EC tablet Take 1 tablet (81 mg total) by mouth daily. 02/12/13  Yes Rogelia Mire, NP  carvedilol (COREG) 3.125 MG tablet Take 1 tablet (3.125 mg total) by  mouth 2 (two) times daily with a meal. 11/03/15  Yes Dorothy Spark, MD  clopidogrel (PLAVIX) 75 MG tablet Take 1 tablet (75 mg total) by mouth daily with breakfast. 11/03/15  Yes Dorothy Spark, MD  furosemide (LASIX) 20 MG tablet Take 1 tablet (20 mg total) by mouth daily as needed for fluid or edema. 11/03/15  Yes Dorothy Spark, MD  methimazole (TAPAZOLE) 5 MG tablet Take 5 mg by mouth 3 (three) times daily.   Yes [provider]  nitroGLYCERIN (NITROSTAT) 0.4 MG SL tablet DISSOLVE ONE TABLET UNDER THE TONGUE EVERY 5 MINUTES AS NEEDED FOR CHEST PAIN.  DO NOT EXCEED A TOTAL OF 3 DOSES IN 15 MINUTES 11/03/15  Yes Dorothy Spark, MD  potassium chloride (K-DUR) 10 MEQ tablet Take 1 tablet (10 mEq total) by mouth daily as needed. Take only when you take your PRN lasix 01/12/16  Yes Dorothy Spark, MD  pravastatin (PRAVACHOL) 40 MG tablet Take 1 tablet (40 mg total) by mouth daily. 11/03/15  Yes Dorothy Spark, MD  acetaminophen (TYLENOL) 500 MG tablet Take 1,000 mg by mouth every 6 (six) hours as needed for mild pain.    [provider]  budesonide-formoterol (SYMBICORT) 160-4.5 MCG/ACT inhaler Inhale 2 puffs into the lungs 2 (two) times daily. 12/06/15   Collene Gobble, MD  HYDROcodone-acetaminophen (NORCO/VICODIN) 5-325 MG tablet Take 1 tablet by mouth every 6 (six) hours as needed for severe pain. 07/15/16   Joy, Shawn C, PA-C  lidocaine (LIDODERM) 5 % Place 1 patch onto the skin daily. Remove & Discard patch within 12 hours or as directed by MD 07/15/16   Joy, Shawn C, PA-C  loratadine (CLARITIN) 10 MG tablet Take 10 mg by mouth daily.    [provider]  methocarbamol (ROBAXIN) 500 MG tablet Take 1 tablet (500 mg total) by mouth 2 (two) times daily. 07/15/16   Joy, Helane Gunther, PA-C    Family History Family History  Problem Relation Age of Onset  . Breast cancer Mother   . Hypertension Brother   . Kidney failure Brother   . Hypertension Sister      Social History Social History  Substance Use Topics  . Smoking status: Former Smoker    Packs/day: 0.50    Years: 31.00    Types: Cigarettes    Quit date: 02/05/2013  . Smokeless tobacco: Never Used  . Alcohol use 0.0 oz/week     Comment: rarely     Allergies   Demerol [meperidine]; Latex; and Novocain [procaine hcl]   Review of Systems Review of Systems  Constitutional: Negative for chills and fever.  Gastrointestinal: Negative for  nausea and vomiting.  Musculoskeletal: Positive for back pain. Negative for neck pain.  Neurological: Negative for dizziness, weakness, light-headedness, numbness and headaches.  All other systems reviewed and are negative.    Physical Exam Updated Vital Signs BP 118/78 (BP Location: Right Arm)   Pulse 80   Temp 98.5 F (36.9 C) (Oral)   Resp 18   Ht 5\' 3"  (1.6 m)   Wt 54.4 kg (120 lb)   SpO2 100%   BMI 21.26 kg/m   Physical Exam  Constitutional: She appears well-developed and well-nourished. No distress.  HENT:  Head: Normocephalic and atraumatic.  Eyes: Conjunctivae and EOM are normal. Pupils are equal, round, and reactive to light.  Neck: Normal range of motion. Neck supple.  Cardiovascular: Normal rate, regular rhythm, normal heart sounds and intact distal pulses.   Pulmonary/Chest: Effort normal and breath sounds normal. No respiratory distress.  Abdominal: Soft. There is no tenderness. There is no guarding.  Musculoskeletal: She exhibits tenderness. She exhibits no edema.  Tenderness to left lumbar musculature and into midline lumbar spine. No deformity, step-off, crepitus, or other abnormality noted. Normal motor function intact in all extremities and spine. No other midline spinal tenderness.   Neurological: She is alert.  No sensory deficits. Strength 5/5 in all extremities. No foot drop. Antalgic gait. Coordination intact including heel to shin and finger to nose. Cranial nerves III-XII grossly intact. No facial droop.    Skin: Skin is warm and dry. Capillary refill takes less than 2 seconds. She is not diaphoretic.  Psychiatric: She has a normal mood and affect. Her behavior is normal.  Nursing note and vitals reviewed.    ED Treatments / Results  Labs (all labs ordered are listed, but only abnormal results are displayed) Labs Reviewed - No data to display  EKG  EKG Interpretation None       Radiology Dg Thoracic Spine 2 View  Result Date: 07/15/2016 CLINICAL DATA:  Fall. EXAM: THORACIC SPINE 2 VIEWS COMPARISON:  02/19/2015. FINDINGS: Thoracolumbar spine scoliosis. No acute bony abnormality identified. No evidence of fracture. IMPRESSION: Thoracolumbar spine scoliosis.  No acute abnormality. Electronically Signed   By: [place]   On: 07/15/2016 10:12   Dg Lumbar Spine Complete  Result Date: 07/15/2016 CLINICAL DATA:  Fall.  Pain. EXAM: LUMBAR SPINE - COMPLETE 4+ VIEW COMPARISON:  No recent prior . FINDINGS: Mild scoliosis concave left. No acute bony abnormality identified. No evidence of fracture. IMPRESSION: Mild scoliosis concave left.  No acute bony abnormality. Electronically Signed   By: Marcello Moores  Register   On: 07/15/2016 10:11    Procedures Procedures (including critical care time)  Medications Ordered in ED Medications  oxyCODONE-acetaminophen (PERCOCET/ROXICET) 5-325 MG per tablet 1 tablet (1 tablet Oral Given 07/15/16 0944)     Initial Impression / Assessment and Plan / ED Course  I have reviewed the triage vital signs and the nursing notes.  Pertinent labs & imaging results that were available during my care of the patient were reviewed by me and considered in my medical decision making (see chart for details).     Patient presents with back pain following a fall last week. No neuro or functional deficits noted. Vital signs stable here in the ED. Denies complaints related to head/cervical spine injury. PCP follow up. The patient was given instructions for home care as  well as strict return precautions. Patient voices understanding of these instructions, accepts the plan, and is comfortable with discharge.  Vitals:   07/15/16 2542  07/15/16 1048  BP: 118/78 118/86  Pulse: 80 86  Resp: 18 18  Temp: 98.5 F (36.9 C)   TempSrc: Oral   SpO2: 100% 100%  Weight: 54.4 kg (120 lb)   Height: 5\' 3"  (1.6 m)     Final Clinical Impressions(s) / ED Diagnoses   Final diagnoses:  Acute left-sided low back pain with left-sided sciatica    New Prescriptions New Prescriptions   HYDROCODONE-ACETAMINOPHEN (NORCO/VICODIN) 5-325 MG TABLET    Take 1 tablet by mouth every 6 (six) hours as needed for severe pain.   LIDOCAINE (LIDODERM) 5 %    Place 1 patch onto the skin daily. Remove & Discard patch within 12 hours or as directed by MD   METHOCARBAMOL (ROBAXIN) 500 MG TABLET    Take 1 tablet (500 mg total) by mouth 2 (two) times daily.     Lorayne Bender, PA-C 07/15/16 1052    Tanna Furry, MD 07/17/16 [id number]

## 2016-07-15 NOTE — ED Triage Notes (Signed)
L lower back pain since Thursday. Pt states she fell Thursday after getting dizzy while trying to get in her car. Pt fell forward and hit her head on the car. Denies LOC

## 2016-07-15 NOTE — Discharge Instructions (Addendum)
Expect your soreness to increase over the next 2-3 days. Take it easy, but do not lay around too much as this may make any stiffness worse.  Pain: May take tylenol as needed for pain. Vicodin for severe pain. Do not drive or perform other dangerous activities while taking the Vicodin. It is important to note that Vicodin contains 325 mg of tylenol per pill. The maximum total daily tylenol intake should be limited to 4000 mg. Take this into account when taking over the counter tylenol with medications like Vicodin.  Muscle relaxer: Robaxin is a muscle relaxer and may help loosen stiff muscles. Do not take the Robaxin while driving or performing other dangerous activities.   Lidocaine patches: These are available via either prescription or over-the-counter. The over-the-counter option may be more economical one and are likely just as effective. There are multiple over-the-counter brands, such as Salonpas. Of note, you have a documented allergy to Novocain. The delivery method of the lidocaine patch is different. You may test a small area of the skin. If there is a reaction, remove the patch and wash the area thoroughly with soap and water. May take a Benadryl as well.  Exercises: Be sure to perform the attached exercises starting with three times a week and working up to performing them daily. This is an essential part of preventing long term problems.   Follow up with a primary care provider for any future management of these complaints.

## 2016-07-23 ENCOUNTER — Other Ambulatory Visit: Payer: BLUE CROSS/BLUE SHIELD

## 2016-07-29 ENCOUNTER — Ambulatory Visit
Admission: RE | Admit: 2016-07-29 | Discharge: 2016-07-29 | Disposition: A | Discharge status: Home / Self Care | Payer: BLUE CROSS/BLUE SHIELD | Source: Ambulatory Visit | Attending: Family Medicine | Admitting: Family Medicine

## 2016-07-29 DIAGNOSIS — N63 Unspecified lump in unspecified breast: Secondary | ICD-10-CM

## 2016-08-06 ENCOUNTER — Other Ambulatory Visit: Payer: Self-pay | Admitting: Emergency Medicine

## 2016-08-08 ENCOUNTER — Other Ambulatory Visit: Payer: Self-pay | Admitting: Nurse Practitioner

## 2016-08-08 ENCOUNTER — Other Ambulatory Visit (HOSPITAL_COMMUNITY)
Admission: RE | Admit: 2016-08-08 | Discharge: 2016-08-08 | Disposition: A | Discharge status: Home / Self Care | Payer: BLUE CROSS/BLUE SHIELD | Source: Ambulatory Visit | Attending: Nurse Practitioner | Admitting: Nurse Practitioner

## 2016-08-08 DIAGNOSIS — Z124 Encounter for screening for malignant neoplasm of cervix: Secondary | ICD-10-CM | POA: Insufficient documentation

## 2016-08-08 DIAGNOSIS — Z1151 Encounter for screening for human papillomavirus (HPV): Secondary | ICD-10-CM | POA: Diagnosis not present

## 2016-08-12 LAB — CYTOLOGY - PAP
Adequacy: ABSENT
CHLAMYDIA, DNA PROBE: NEGATIVE
Diagnosis: NEGATIVE
HPV: NOT DETECTED
NEISSERIA GONORRHEA: NEGATIVE
TRICH (WINDOWPATH): NEGATIVE

## 2016-08-27 ENCOUNTER — Telehealth: Payer: Self-pay | Admitting: Cardiology

## 2016-08-27 NOTE — Telephone Encounter (Signed)
Pt states that her Dentist will be faxing over a clearance form for free consultation on this pt to have multiple teeth extraction and implants done.  Dentist will need Dr Meda Coffee to advise on this before they will even provide a free consultation for the pt.  Provided the pt our fax information to have her Dentist fax what he will require Dr Meda Coffee to advise on.  Gave her our fax number at [phone] and attention that too Dr Meda Coffee.  Pt verbalized understanding and agrees with this plan.  Pt gracious for all the assistance provided.

## 2016-08-27 NOTE — Telephone Encounter (Signed)
New message    Pt is calling asking for a call back. She said it's about a dentist appt she has. She said she needs a letter that it's ok to go to the dentist.

## 2016-10-17 IMAGING — MG MM DIAG BREAST TOMO BILATERAL
6 of 10 series · 6 of 30 positions shown · non-contrast
Comparison: Prior exams

CLINICAL DATA: Patient with painful right nipple with yellowish
discharge. Symptoms for 1-2 weeks.

EXAM:
DIGITAL DIAGNOSTIC BILATERAL MAMMOGRAM WITH 3D TOMOSYNTHESIS WITH
CAD
ULTRASOUND RIGHT BREAST

[R CC]
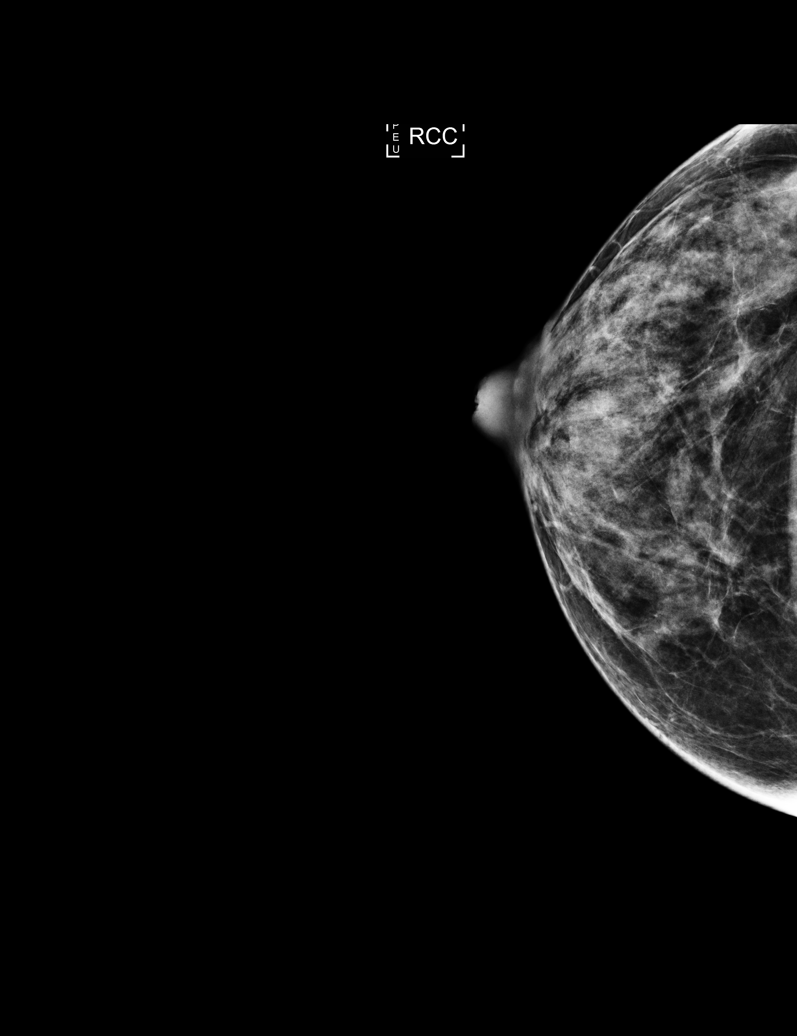

[R TAN]
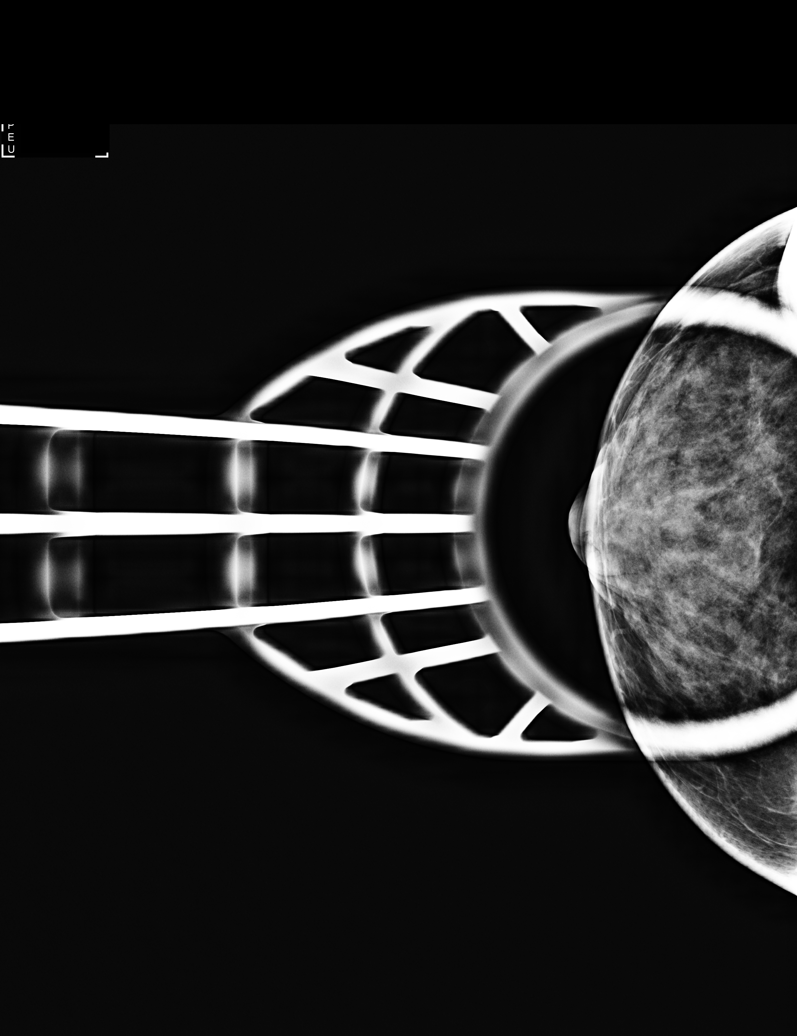

[R MLO]
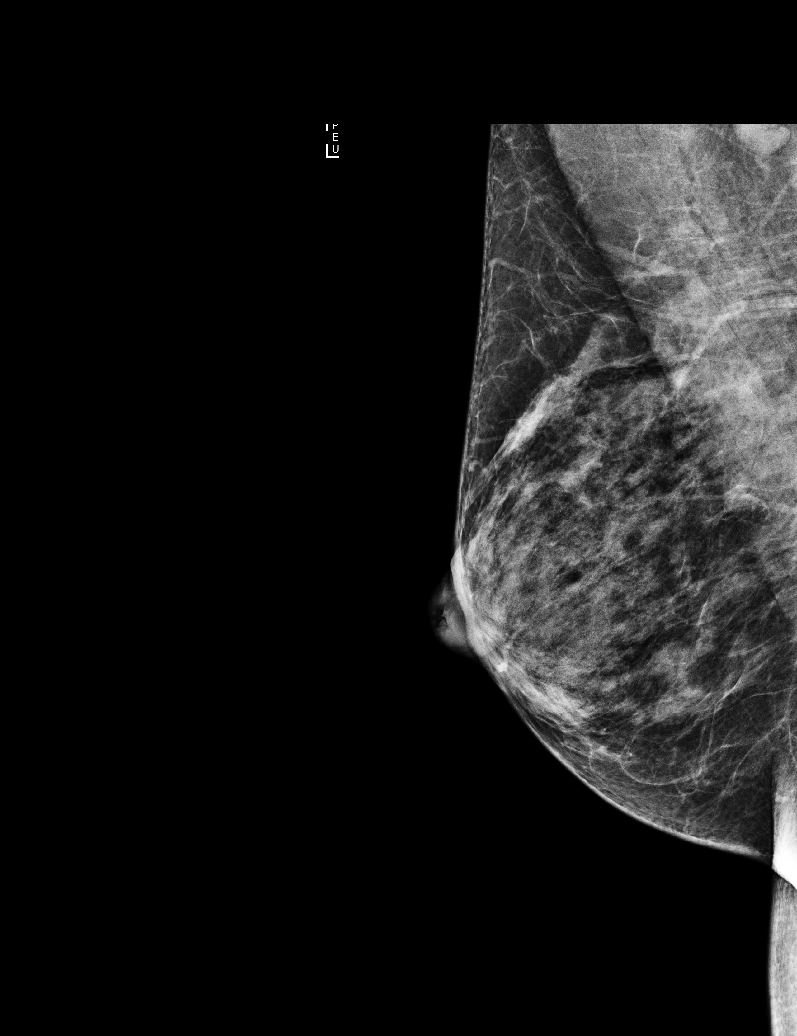

[L CC]
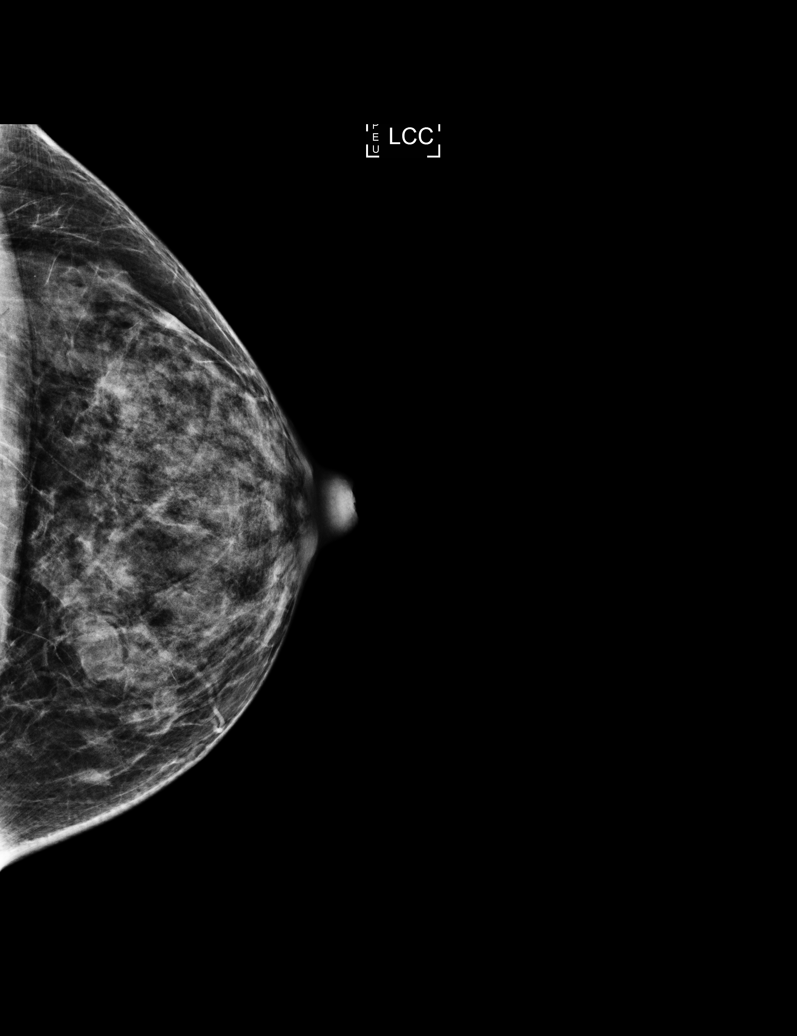

[L MLO]
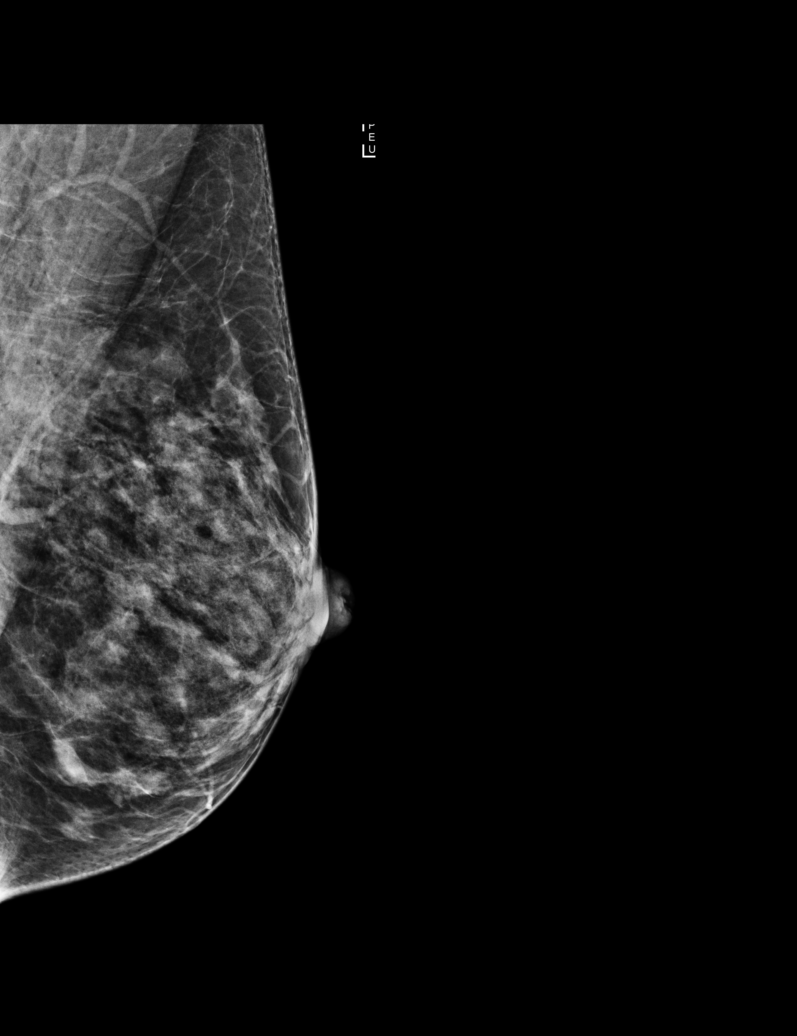

[L CC tomo · tomo slice 25/49.0]
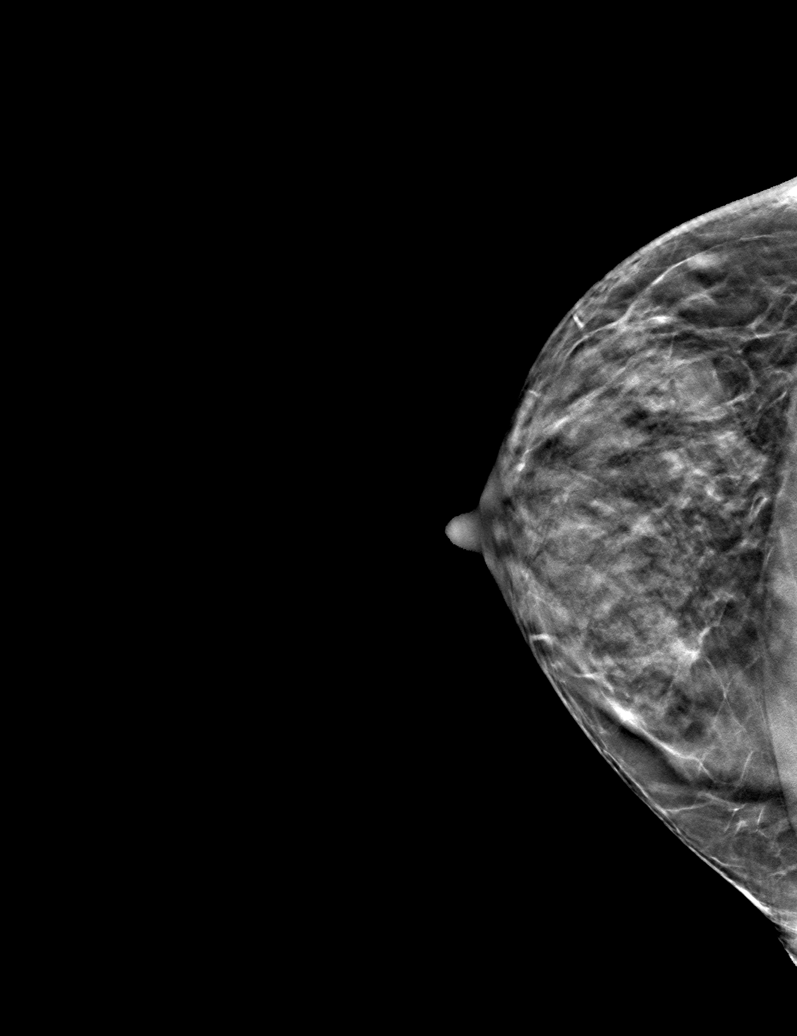

[6 of 30 positions shown; findings below may reference images not displayed]

ACR Breast Density Category c: The breast tissue is heterogeneously
dense, which may obscure small masses.
FINDINGS: There are no discrete masses or areas of architectural distortion.
There are no suspicious calcifications. There has been no
mammographic chains on the prior studies.

Mammographic images were processed with CAD.

On physical exam, patient is tender to palpation areola and
periareolar region. There is no discrete mass. No nipple discharge
could be expressed.

Targeted ultrasound is performed, showing mildly ectatic
retroareolar ducts, with no filling defect or duct debris. No nipple
or retroareolar masses are evident.
IMPRESSION: No evidence of malignancy. Cause of the patient's nipple discomfort
and discharge is not evident.

RECOMMENDATION:
Screening mammogram in one year.(Code:[id number]).

Clinical re-evaluation if the patient breast pain and nipple
discharge persists or worsens. If symptoms worsen, particularly if
discharge continues and is spontaneous, attempted ductography may be
indicated. Since the patient's symptoms may be from nipple on
infection/inflammation, a trial of antibiotic therapy should be
considered clinically.

I have discussed the findings and recommendations with the patient.
Results were also provided in writing at the conclusion of the
visit. If applicable, a reminder letter will be sent to the patient
regarding the next appointment.

BI-RADS CATEGORY  1: Negative.

## 2016-10-31 ENCOUNTER — Telehealth: Payer: Self-pay | Admitting: Cardiology

## 2016-10-31 MED ORDER — COLCHICINE 0.6 MG PO TABS: 0.6000 mg | ORAL_TABLET | Freq: Two times a day (BID) | ORAL | 0 refills | Status: DC

## 2016-10-31 NOTE — Telephone Encounter (Signed)
Patient informed and verbalized understanding of plan. Patient is aware that she needs to keep the appointment that is scheduled for 11/04/16 @1 :15 pm with [place].

## 2016-10-31 NOTE — Telephone Encounter (Signed)
Pain in area of heart with movement of lifting left arm and taking deep breaths rated 7-8/10. Patient noticed the pain initially last Wednesday and says that today its worse. Patient said she took tylenol and nitroglycerin and did not get any relief. No c/o fever, chills n/v, sob, dizziness. Patient has h/o pericarditis and is requesting a prescription that she had previously to treat pericarditis.  Spoke with Dr. Harrington Challenger and she agrees to refill colchicine 0.6 mg by mouth BID (2 week supply) and take prn OTC advil. If pain worsens or changes, she need to go to the ED for an evaluation.

## 2016-10-31 NOTE — Telephone Encounter (Signed)
New Message  Pt call requesting to speak with RN. Pt states she feels that her infection "Pericarditis" is coming back and will need a prescription for the medication she was prescribed. Pt does not know the name of the medication. Please call back to discuss

## 2016-11-04 ENCOUNTER — Encounter: Payer: Self-pay | Admitting: Physician Assistant

## 2016-11-04 ENCOUNTER — Encounter (INDEPENDENT_AMBULATORY_CARE_PROVIDER_SITE_OTHER): Payer: Self-pay

## 2016-11-04 ENCOUNTER — Ambulatory Visit (INDEPENDENT_AMBULATORY_CARE_PROVIDER_SITE_OTHER): Payer: BLUE CROSS/BLUE SHIELD | Admitting: Physician Assistant

## 2016-11-04 VITALS — BP 110/74 | HR 83 | Wt 117.0 lb

## 2016-11-04 DIAGNOSIS — I10 Essential (primary) hypertension: Secondary | ICD-10-CM | POA: Diagnosis not present

## 2016-11-04 DIAGNOSIS — I319 Disease of pericardium, unspecified: Secondary | ICD-10-CM | POA: Insufficient documentation

## 2016-11-04 DIAGNOSIS — E785 Hyperlipidemia, unspecified: Secondary | ICD-10-CM | POA: Diagnosis not present

## 2016-11-04 DIAGNOSIS — I2583 Coronary atherosclerosis due to lipid rich plaque: Secondary | ICD-10-CM

## 2016-11-04 DIAGNOSIS — I309 Acute pericarditis, unspecified: Secondary | ICD-10-CM | POA: Diagnosis not present

## 2016-11-04 DIAGNOSIS — I2510 Atherosclerotic heart disease of native coronary artery without angina pectoris: Secondary | ICD-10-CM | POA: Diagnosis not present

## 2016-11-04 MED ORDER — COLCHICINE 0.6 MG PO TABS: 0.6000 mg | ORAL_TABLET | Freq: Two times a day (BID) | ORAL | 2 refills | Status: DC

## 2016-11-04 NOTE — Progress Notes (Signed)
Cardiology Office Note    Date:  11/04/2016   ID:  Ortencia Kick, DOB 1965/07/07, MRN [id number]  PCP:  Tonette Bihari, MD  Cardiologist: Dr.Nelson  Chief Complaint  Patient presents with  . Chest Pain    History of Present Illness:  [name] [name] is a 51 y.o. female with history of NSTEMI with VF nonocclusive intraluminal thrombus managed with heparin in the setting of cocaine use in 2015, normal LVEF 65%. History of recurrent pericarditis in 2017 and last seen by Dr. Meda Coffee 04/15/16 and was doing well. Patient also has HLD.  Patient called in last week complaining of chest pain similar to her pericarditis and colchicine was called in for her.  Patient complained of chest pain with deep inspiration and movement with her left arm. She took a nitroglycerin without relief. Since she's been on the colchicine and Advil her pain has subsided. She is feeling much better. No recent viral illness.    Past Medical History:  Diagnosis Date  . Abnormal Pap smear    Colpo (twice)  . Anemia   . Anxiety and depression 01/01/2014  . Breast disorder    soreness in left breast   . CAD (coronary artery disease)    a. 01/2013 NSTEMI/Cath: LM nl, LAD 76m, LCX nl, RI nl, RCA nl, PDA ostial nonocclusive intraluminal thrombus, EF 65%, Treated with heparin x 48 hrs->Med Rx (occurred in setting of + cocaine);  b. 01/2013 Echo: EF 60-65%, nl wall motion.  . Chlamydia    years ago  . Depression   . Heart disease    MI Jan 2015  . High cholesterol   . Hypertension   . Myocardial infarction ([place])   . Polysubstance abuse ([place])    tobacco/cocaine/marijuana (+ UDS 01/2013).  . Thyroid disease   . Trichomonas    years ago    Past Surgical History:  Procedure Laterality Date  . BREAST EXCISIONAL BIOPSY Right   . BREAST SURGERY     lump removed from right breast  . CERVICAL CONE BIOPSY    . CESAREAN SECTION     2 c-sections  . ECTOPIC PREGNANCY SURGERY    . ENDOMETRIAL ABLATION W/ NOVASURE    .  GROIN MASS OPEN BIOPSY    . LEFT HEART CATHETERIZATION WITH CORONARY ANGIOGRAM N/A 02/08/2013   Procedure: LEFT HEART CATHETERIZATION WITH CORONARY ANGIOGRAM;  Surgeon: Sinclair Grooms, MD;  Location[facility] C[facility] LAB;  Service: Cardiovascular;  Laterality: N/A;  . TUBAL LIGATION      Current Medications: Current Meds  Medication Sig  . acetaminophen (TYLENOL) 500 MG tablet Take 1,000 mg by mouth every 6 (six) hours as needed for mild pain.  Marland Kitchen aspirin EC 81 MG EC tablet Take 1 tablet (81 mg total) by mouth daily.  . budesonide-formoterol (SYMBICORT) 160-4.5 MCG/ACT inhaler Inhale 2 puffs into the lungs 2 (two) times daily.  . carvedilol (COREG) 3.125 MG tablet Take 1 tablet (3.125 mg total) by mouth 2 (two) times daily with a meal.  . clopidogrel (PLAVIX) 75 MG tablet Take 1 tablet (75 mg total) by mouth daily with breakfast.  . colchicine 0.6 MG tablet Take 1 tablet (0.6 mg total) by mouth 2 (two) times daily.  . furosemide (LASIX) 20 MG tablet Take 1 tablet (20 mg total) by mouth daily as needed for fluid or edema.  Marland Kitchen loratadine (CLARITIN) 10 MG tablet Take 10 mg by mouth daily.  . methimazole (TAPAZOLE) 5 MG tablet Take 2.5 mg by  mouth daily.   . nitroGLYCERIN (NITROSTAT) 0.4 MG SL tablet DISSOLVE ONE TABLET UNDER THE TONGUE EVERY 5 MINUTES AS NEEDED FOR CHEST PAIN.  DO NOT EXCEED A TOTAL OF 3 DOSES IN 15 MINUTES  . potassium chloride (K-DUR) 10 MEQ tablet Take 1 tablet (10 mEq total) by mouth daily as needed. Take only when you take your PRN lasix  . pravastatin (PRAVACHOL) 40 MG tablet Take 1 tablet (40 mg total) by mouth daily.  Marland Kitchen PROAIR HFA 108 (90 Base) MCG/ACT inhaler INHALE TWO PUFFS INTO LUNGS EVERY 4 HOURS AS NEEDED FOR WHEEZING FOR SHORTNESS OF BREATH  . [DISCONTINUED] colchicine 0.6 MG tablet Take 1 tablet (0.6 mg total) by mouth 2 (two) times daily.     Allergies:   Demerol [meperidine]; Latex; and Novocain [procaine hcl]   Social History   Social History  . Marital status:  Single    Spouse name: N/A  . Number of children: N/A  . Years of education: N/A   Social History Main Topics  . Smoking status: Former Smoker    Packs/day: 0.50    Years: 31.00    Types: Cigarettes    Quit date: 02/05/2013  . Smokeless tobacco: Never Used  . Alcohol use 0.0 oz/week     Comment: rarely  . Drug use: Yes    Frequency: 2.0 times per week    Types: Marijuana     Comment: Daily Use  . Sexual activity: Yes    Birth control/ protection: None, Surgical   Other Topics Concern  . None   Social History Narrative   Lives in [place]    Works in child care.      Family History:  The patient's family history includes Breast cancer in her mother; Hypertension in her brother and sister; Kidney failure in her brother.   ROS:   Please see the history of present illness.    Review of Systems  Constitution: Negative.  HENT: Negative.   Eyes: Negative.   Cardiovascular: Positive for chest pain.  Respiratory: Negative.   Hematologic/Lymphatic: Negative.   Musculoskeletal: Negative.  Negative for joint pain.  Gastrointestinal: Negative.   Genitourinary: Negative.   Neurological: Negative.    All other systems reviewed and are negative.   PHYSICAL EXAM:   VS:  BP 110/74   Pulse 83   Wt 117 lb (53.1 kg)   LMP 01/31/2014   SpO2 99%   BMI 20.73 kg/m   Physical Exam  GEN: Well nourished, well developed, in no acute distress  Neck: no JVD, carotid bruits, or masses Cardiac:RRR; no murmurs, rubs, or gallops  Respiratory:  clear to auscultation bilaterally, normal work of breathing GI: soft, nontender, nondistended, + BS Ext: without cyanosis, clubbing, or edema, Good distal pulses bilaterally Neuro:  Alert and Oriented x 3 Psych: euthymic mood, full affect  Wt Readings from Last 3 Encounters:  11/04/16 117 lb (53.1 kg)  07/15/16 120 lb (54.4 kg)  04/15/16 123 lb (55.8 kg)      Studies/Labs Reviewed:   EKG:  EKG is  ordered today.  The ekg ordered today  demonstrates Normal sinus rhythm, normal EKG  01/12/2016: Brain Natriuretic Peptide 5.6 03/06/2016: ALT 14; BUN 7; Creat 1.04; Hemoglobin 14.5; Platelets 245; Potassium 3.9; Sodium 143; TSH 0.28   Lipid Panel    Component Value Date/Time   CHOL 174 01/12/2016 1018   CHOL 173 03/16/2015 1139   TRIG 81 01/12/2016 1018   TRIG 99 03/16/2015 1139   HDL 45 (  L) 01/12/2016 1018   HDL 57 03/16/2015 1139   CHOLHDL 3.9 01/12/2016 1018   VLDL 16 01/12/2016 1018   LDLCALC 113 (H) 01/12/2016 1018   LDLCALC 96 03/16/2015 1139    Additional studies/ records that were reviewed today include:  2-D echo 04/03/15 Study Conclusions   - Left ventricle: The cavity size was normal. Systolic function was   normal. The estimated ejection fraction was in the range of 60%   to 65%. Wall motion was normal; there were no regional wall   motion abnormalities. Left ventricular diastolic function   parameters were normal. - Aortic valve: Transvalvular velocity was within the normal range.   There was no stenosis. There was no regurgitation. - Mitral valve: Transvalvular velocity was within the normal range.   There was no evidence for stenosis. There was no regurgitation. - Right ventricle: The cavity size was normal. Wall thickness was   normal. Systolic function was normal. - Tricuspid valve: There was trivial regurgitation. - Inferior vena cava: The vessel was normal in size. The   respirophasic diameter changes were in the normal range (>= 50%),   consistent with normal central venous pressure.   Nuclear stress test 2015Impression Exercise Capacity:  Good exercise capacity. BP Response:  Hypotensive blood pressure response. Clinical Symptoms:  There is dyspnea. ECG Impression:  No significant ST segment change suggestive of ischemia. Comparison with Prior Nuclear Study: No images to compare   Overall Impression:  Low risk stress nuclear study with a small sized, mild fixed mid and basal inferior defect  that is most consistent with diaphragmatic attenuation and increased gut uptake.  No evidence of ischemia.  Of note the patient's BP was 116/39mmHg in stage 3 and decreased to 140/3mmHg in Stage 2.  The next BP was in recovery at 173/76mmHg.   LV Ejection Fraction: 63%.  LV Wall Motion:  NL LV Function; NL Wall Motion   Signed: Fransico Him, MD [facility] 11/10/2013        ASSESSMENT:    1. Acute pericarditis, unspecified type   2. Coronary artery disease due to lipid rich plaque   3. Essential hypertension   4. Hyperlipidemia, unspecified hyperlipidemia type      PLAN:  In order of problems listed above:  Acute pericarditis improving with colchicine. Will continue for 3 months. This is her third episode of pericarditis. She's had no viral illness or precipitating cause. EKG normal. Follow-up with Dr. Meda Coffee in 3-4 months.  CAD with MI secondary to thrombus and cocaine use in 2015 on Plavix aspirin and Coreg  Essential hypertension controlled with low-dose Coreg  Hyperlipidemia on Pravachol    Medication Adjustments/Labs and Tests Ordered: Current medicines are reviewed at length with the patient today.  Concerns regarding medicines are outlined above.  Medication changes, Labs and Tests ordered today are listed in the Patient Instructions below. Patient Instructions  Your physician recommends that you continue on your current medications as directed. Please refer to the Current Medication list given to you today. Your physician wants you to follow-up in: 3-4 months with Dr. Meda Coffee.  You will receive a reminder letter in the mail two months in advance. If you don't receive a letter, please call our office to schedule the follow-up appointment.     Signed, Ermalinda Barrios, PA-C  11/04/2016 1:47 PM    [facility] [address], [place], [place]  [address] Phone: [phone]; Fax: [phone]

## 2016-11-04 NOTE — Patient Instructions (Signed)
Your physician recommends that you continue on your current medications as directed. Please refer to the Current Medication list given to you today. Your physician wants you to follow-up in: 3-4 months with Dr. Meda Coffee.  You will receive a reminder letter in the mail two months in advance. If you don't receive a letter, please call our office to schedule the follow-up appointment.

## 2016-11-04 NOTE — Addendum Note (Signed)
Addended by: Patterson Hammersmith A on: 11/04/2016 02:41 PM   Modules accepted: Orders

## 2016-11-12 ENCOUNTER — Ambulatory Visit: Payer: BLUE CROSS/BLUE SHIELD | Admitting: Cardiology

## 2016-12-04 ENCOUNTER — Other Ambulatory Visit: Payer: Self-pay | Admitting: Cardiology

## 2016-12-04 DIAGNOSIS — I309 Acute pericarditis, unspecified: Secondary | ICD-10-CM

## 2016-12-04 DIAGNOSIS — R002 Palpitations: Secondary | ICD-10-CM

## 2016-12-04 DIAGNOSIS — I10 Essential (primary) hypertension: Secondary | ICD-10-CM

## 2016-12-25 ENCOUNTER — Encounter (HOSPITAL_COMMUNITY): Payer: Self-pay

## 2017-01-15 ENCOUNTER — Other Ambulatory Visit: Payer: Self-pay | Admitting: Emergency Medicine

## 2017-01-16 ENCOUNTER — Other Ambulatory Visit: Payer: Self-pay | Admitting: Emergency Medicine

## 2017-01-23 ENCOUNTER — Other Ambulatory Visit: Payer: Self-pay | Admitting: *Deleted

## 2017-01-24 MED ORDER — ALBUTEROL SULFATE HFA 108 (90 BASE) MCG/ACT IN AERS: INHALATION_SPRAY | RESPIRATORY_TRACT | 0 refills | Status: DC

## 2017-01-24 NOTE — Addendum Note (Signed)
Addended by: Christen Bame D on: 01/24/2017 12:21 PM   Modules accepted: Orders

## 2017-01-24 NOTE — Telephone Encounter (Signed)
Pt called back to inquire, she needed the med sent to [facility] on [address].  Will resend now. [name] [name], Salome Spotted, CMA

## 2017-02-11 ENCOUNTER — Encounter (HOSPITAL_COMMUNITY): Payer: Self-pay

## 2017-02-25 ENCOUNTER — Other Ambulatory Visit: Payer: Self-pay | Admitting: Cardiology

## 2017-02-25 DIAGNOSIS — E782 Mixed hyperlipidemia: Secondary | ICD-10-CM

## 2017-02-25 DIAGNOSIS — I10 Essential (primary) hypertension: Secondary | ICD-10-CM

## 2017-02-25 DIAGNOSIS — E785 Hyperlipidemia, unspecified: Secondary | ICD-10-CM

## 2017-02-25 DIAGNOSIS — I2583 Coronary atherosclerosis due to lipid rich plaque: Secondary | ICD-10-CM

## 2017-02-25 DIAGNOSIS — R002 Palpitations: Secondary | ICD-10-CM

## 2017-02-25 DIAGNOSIS — I309 Acute pericarditis, unspecified: Secondary | ICD-10-CM

## 2017-02-25 DIAGNOSIS — I2510 Atherosclerotic heart disease of native coronary artery without angina pectoris: Secondary | ICD-10-CM

## 2017-03-26 ENCOUNTER — Other Ambulatory Visit: Payer: Self-pay | Admitting: Otolaryngology

## 2017-03-26 DIAGNOSIS — R220 Localized swelling, mass and lump, head: Secondary | ICD-10-CM

## 2017-03-26 DIAGNOSIS — K119 Disease of salivary gland, unspecified: Secondary | ICD-10-CM

## 2017-03-28 ENCOUNTER — Ambulatory Visit
Admission: RE | Admit: 2017-03-28 | Discharge: 2017-03-28 | Disposition: A | Discharge status: Home / Self Care | Payer: BLUE CROSS/BLUE SHIELD | Source: Ambulatory Visit | Attending: Otolaryngology | Admitting: Otolaryngology

## 2017-03-28 DIAGNOSIS — K119 Disease of salivary gland, unspecified: Secondary | ICD-10-CM

## 2017-04-01 ENCOUNTER — Ambulatory Visit
Admission: RE | Admit: 2017-04-01 | Discharge: 2017-04-01 | Disposition: A | Discharge status: Home / Self Care | Payer: BLUE CROSS/BLUE SHIELD | Source: Ambulatory Visit | Attending: Otolaryngology | Admitting: Otolaryngology

## 2017-04-01 DIAGNOSIS — K119 Disease of salivary gland, unspecified: Secondary | ICD-10-CM

## 2017-04-01 DIAGNOSIS — R220 Localized swelling, mass and lump, head: Secondary | ICD-10-CM

## 2017-04-01 MED ORDER — GADOBENATE DIMEGLUMINE 529 MG/ML IV SOLN
10.0000 mL | Freq: Once | INTRAVENOUS | Status: AC | PRN
  Administered 2017-04-01: 10 mL via INTRAVENOUS

## 2017-04-08 ENCOUNTER — Other Ambulatory Visit: Payer: Self-pay | Admitting: Surgery

## 2017-04-08 DIAGNOSIS — E041 Nontoxic single thyroid nodule: Secondary | ICD-10-CM

## 2017-04-15 ENCOUNTER — Ambulatory Visit
Admission: RE | Admit: 2017-04-15 | Discharge: 2017-04-15 | Disposition: A | Discharge status: Home / Self Care | Payer: BLUE CROSS/BLUE SHIELD | Source: Ambulatory Visit | Attending: Surgery | Admitting: Surgery

## 2017-04-15 DIAGNOSIS — E041 Nontoxic single thyroid nodule: Secondary | ICD-10-CM

## 2017-04-21 ENCOUNTER — Other Ambulatory Visit: Payer: Self-pay | Admitting: Surgery

## 2017-04-21 DIAGNOSIS — E041 Nontoxic single thyroid nodule: Secondary | ICD-10-CM

## 2017-05-13 ENCOUNTER — Telehealth: Payer: Self-pay | Admitting: Physician Assistant

## 2017-05-13 ENCOUNTER — Ambulatory Visit
Admission: RE | Admit: 2017-05-13 | Discharge: 2017-05-13 | Disposition: A | Discharge status: Home / Self Care | Payer: BLUE CROSS/BLUE SHIELD | Source: Ambulatory Visit | Attending: Surgery | Admitting: Surgery

## 2017-05-13 DIAGNOSIS — E041 Nontoxic single thyroid nodule: Secondary | ICD-10-CM

## 2017-05-13 NOTE — Telephone Encounter (Signed)
New message  Pt verbalzied that she is calling for RN  Pt c/o medication issue:  1. Name of Medication: methimazole (TAPAZOLE) 5 MG tablet  2. How are you currently taking this medication (dosage and times per day)? Take 2.5 mg by mouth daily.  3. Are you having a reaction (difficulty breathing--STAT)? no  4. What is your medication issue? Pt endocrinologist named Dr.Prestion Carlis Abbott has retired and pt want to know if we can refill her medications

## 2017-05-13 NOTE — Progress Notes (Signed)
  Ms Kofman was seen in the outpatient IR clinic today for an FNA thyroid biopsy.  She is being postponed for 2 reasons: 1. She did not stop her Plavix. She takes ASA and Plavix for CAD. She would like to call her cardiologist and discuss whether or not it is safe for her to hold the Plavix x 5 days per our protocol.  2. She is TERRIFIED of needles. She walking into the room and saw all the needles on the table and actually turned around to leave. I talked to her for a few minutes and she was very tearful and anxious. She said "I can't do this with that many needles involved".  She needs to be scheduled at either [facility] or [facility] so she can recieve IV sedation.   She understands she will need to be NPO for 6 hours prior to this. She can takes medications with a sip of water (Except Plavix).  Kavin Weckwerth S Coty Larsh PA-C 05/13/2017 4:19 PM

## 2017-05-13 NOTE — Telephone Encounter (Signed)
Instructed patient to contact PCP to fill. She was grateful for call and agrees with treatment plan.

## 2017-05-14 ENCOUNTER — Other Ambulatory Visit (HOSPITAL_COMMUNITY): Payer: Self-pay | Admitting: Surgery

## 2017-05-14 ENCOUNTER — Other Ambulatory Visit: Payer: Self-pay

## 2017-05-14 DIAGNOSIS — E041 Nontoxic single thyroid nodule: Secondary | ICD-10-CM

## 2017-05-14 NOTE — Telephone Encounter (Signed)
Pt requesting refill of methimazole. MD who was prescribing has retired. She is almost out of meds.  Pt call back [phone] Wallace Cullens, RN

## 2017-05-15 ENCOUNTER — Telehealth: Payer: Self-pay | Admitting: Cardiology

## 2017-05-15 NOTE — Telephone Encounter (Signed)
[facility] Pre-operative Risk Assessment    Request for surgical clearance:  What type of surgery is being performed? Ultrasound Biopsy  1. When is this surgery scheduled? 04/30/2017  2. What type of clearance is required (medical clearance vs. Pharmacy clearance to hold med vs. Both)? Pharmacy  3. Are there any medications that need to be held prior to surgery and how long? Plavix 7 days  4. Practice name and name of physician performing surgery? [facility], Radiology   5. What is your office phone number [phone]   7.   What is your office fax number [phone]  8.   Anesthesia type (None, local, MAC, general) ? None, light sedation    [name] [name] 05/15/2017, 12:53 PM  _________________________________________________________________   (provider comments below)

## 2017-05-15 NOTE — Telephone Encounter (Signed)
.   Phone call placed. Pt reports she has done well since last OV. She denies any CP or dyspnea. No exertional symptoms. Pt is needing thyroid biopsy, to be done under light sedation, planned 05/27/17. Will route to Dr. Meda Coffee to get approval to hold Plavix x 7 days.

## 2017-05-15 NOTE — Telephone Encounter (Signed)
Dr. Meda Coffee, please route your recommendations on Plavix to the CV DIV PREOP pool.

## 2017-05-22 NOTE — Telephone Encounter (Signed)
Spoke with pt re: Plavix and her not getting clearance before 7 days prior to her procedure. Pt informed me that someone from our office had already called her and gave her the ok, but pt couldn't remember who she spoke with . Pt advised to call over to [facility] to let them know that she has been holding it, that way her Biopsy want get cancelled.  Pt thanked me for the call.

## 2017-05-22 NOTE — Telephone Encounter (Signed)
   Primary Cardiologist: Ena Dawley, MD  Chart reviewed as part of pre-operative protocol coverage. My colleague Lyda Jester PA-C initially reviewed this chart on 05/15/17. She spoke with the patient and the patient was doing well without complaint. She routed message to Dr. Meda Coffee to get input on holding Plavix. Dr. Meda Coffee is out of the office. Patient's last cardiac event was in 2015. I discussed with Dr. Acie Fredrickson (doctor of the day) in clinic who feels patient is OK to come off Plavix for 7 days if needed for the procedure, and we recommend to resume when felt safe by the provider performing the procedure. Patient is also overdue for follow-up with our office so will route to pre-op nurse to help arrange.  As it is now <7 days until procedure, the procedure may need to be rescheduled but this will be up to the discretion of the procedure team. I attempted to call patient with phone numbers listed to make her aware but only got her voicemail box which has not been set up yet. I will route this recommendation to the requesting party via Epic fax function and remove from pre-op pool.  Please call with questions.  Charlie Pitter, PA-C 05/22/2017, 2:22 PM

## 2017-05-26 ENCOUNTER — Other Ambulatory Visit: Payer: Self-pay | Admitting: Student

## 2017-05-27 ENCOUNTER — Encounter (HOSPITAL_COMMUNITY): Payer: Self-pay

## 2017-05-27 ENCOUNTER — Ambulatory Visit (HOSPITAL_COMMUNITY)
Admission: RE | Admit: 2017-05-27 | Discharge: 2017-05-27 | Disposition: A | Discharge status: Home / Self Care | Payer: BLUE CROSS/BLUE SHIELD | Source: Ambulatory Visit | Attending: Surgery | Admitting: Surgery

## 2017-05-27 DIAGNOSIS — E041 Nontoxic single thyroid nodule: Secondary | ICD-10-CM | POA: Diagnosis present

## 2017-05-27 LAB — CBC
HEMATOCRIT: 39.7 % (ref 36.0–46.0)
HEMOGLOBIN: 13 g/dL (ref 12.0–15.0)
MCH: 27.8 pg (ref 26.0–34.0)
MCHC: 32.7 g/dL (ref 30.0–36.0)
MCV: 84.8 fL (ref 78.0–100.0)
Platelets: 253 10*3/uL (ref 150–400)
RBC: 4.68 MIL/uL (ref 3.87–5.11)
RDW: 13.8 % (ref 11.5–15.5)
WBC: 7.5 10*3/uL (ref 4.0–10.5)

## 2017-05-27 LAB — PROTIME-INR
INR: 1.04
Prothrombin Time: 13.6 seconds (ref 11.4–15.2)

## 2017-05-27 LAB — APTT: aPTT: 25 seconds (ref 24–36)

## 2017-05-27 MED ORDER — MIDAZOLAM HCL 2 MG/2ML IJ SOLN
INTRAMUSCULAR | Status: AC | PRN
  Administered 2017-05-27 (×2): 1 mg via INTRAVENOUS

## 2017-05-27 MED ORDER — FENTANYL CITRATE (PF) 100 MCG/2ML IJ SOLN
INTRAMUSCULAR | Status: AC | PRN
  Administered 2017-05-27: 50 ug via INTRAVENOUS
  Administered 2017-05-27: 25 ug via INTRAVENOUS

## 2017-05-27 MED ORDER — MIDAZOLAM HCL 2 MG/2ML IJ SOLN
INTRAMUSCULAR | Status: AC
  Filled 2017-05-27: qty 4

## 2017-05-27 MED ORDER — SODIUM CHLORIDE 0.9 % IV SOLN
INTRAVENOUS | Status: DC
  Administered 2017-05-27: 14:00:00 via INTRAVENOUS

## 2017-05-27 MED ORDER — LIDOCAINE HCL (PF) 1 % IJ SOLN
INTRAMUSCULAR | Status: AC
  Filled 2017-05-27: qty 30

## 2017-05-27 MED ORDER — FENTANYL CITRATE (PF) 100 MCG/2ML IJ SOLN
INTRAMUSCULAR | Status: AC
  Filled 2017-05-27: qty 2

## 2017-05-27 NOTE — H&P (Signed)
Chief Complaint: Patient was seen in consultation today for Left mid lobe thyroid nodule biopsy at the request of Gerkin,Todd  Referring Physician(s): Gerkin,Todd  Supervising Physician: Arne Cleveland  Patient Status: [facility] - Out-pt  History of Present Illness: [name] [name] is a 52 y.o. female   Left mid lobe thyroid nodule Scheduled for biopsy with sedation  IMPRESSION: 1. Solitary 3.4 cm mildly suspicious thyroid nodule. Recommend FNA biopsy  Past Medical History:  Diagnosis Date  . Abnormal Pap smear    Colpo (twice)  . Anemia   . Anxiety and depression 01/01/2014  . Breast disorder    soreness in left breast   . CAD (coronary artery disease)    a. 01/2013 NSTEMI/Cath: LM nl, LAD 25m, LCX nl, RI nl, RCA nl, PDA ostial nonocclusive intraluminal thrombus, EF 65%, Treated with heparin x 48 hrs->Med Rx (occurred in setting of + cocaine);  b. 01/2013 Echo: EF 60-65%, nl wall motion.  . Chlamydia    years ago  . Depression   . Heart disease    MI Jan 2015  . High cholesterol   . Hypertension   . Myocardial infarction ([place])   . Polysubstance abuse ([place])    tobacco/cocaine/marijuana (+ UDS 01/2013).  . Thyroid disease   . Trichomonas    years ago    Past Surgical History:  Procedure Laterality Date  . BREAST EXCISIONAL BIOPSY Right   . BREAST SURGERY     lump removed from right breast  . CERVICAL CONE BIOPSY    . CESAREAN SECTION     2 c-sections  . ECTOPIC PREGNANCY SURGERY    . ENDOMETRIAL ABLATION W/ NOVASURE    . GROIN MASS OPEN BIOPSY    . LEFT HEART CATHETERIZATION WITH CORONARY ANGIOGRAM N/A 02/08/2013   Procedure: LEFT HEART CATHETERIZATION WITH CORONARY ANGIOGRAM;  Surgeon: Sinclair Grooms, MD;  Location: [facility] CATH LAB;  Service: Cardiovascular;  Laterality: N/A;  . TUBAL LIGATION      Allergies: Demerol [meperidine]; Latex; and Novocain [procaine hcl]  Medications: Prior to Admission medications   Medication Sig Start Date End Date Taking?  Authorizing Provider  acetaminophen (TYLENOL) 500 MG tablet Take 1,000 mg by mouth every 6 (six) hours as needed for mild pain.    [provider]  albuterol (PROAIR HFA) 108 (90 Base) MCG/ACT inhaler INHALE TWO PUFFS INTO LUNGS EVERY 4 HOURS AS NEEDED FOR WHEEZING FOR SHORTNESS OF BREATH 01/24/17   Mikell, Jeani Sow, MD  aspirin EC 81 MG EC tablet Take 1 tablet (81 mg total) by mouth daily. 02/12/13   Theora Gianotti, NP  budesonide-formoterol [facility]) 160-4.5 MCG/ACT inhaler Inhale 2 puffs into the lungs 2 (two) times daily. 12/06/15   Collene Gobble, MD  carvedilol (COREG) 3.125 MG tablet TAKE ONE TABLET BY MOUTH TWICE DAILY WITH MEALS 02/25/17   Dorothy Spark, MD  clopidogrel (PLAVIX) 75 MG tablet TAKE ONE TABLET BY MOUTH ONCE DAILY WITH  BREAKFAST 12/04/16   Dorothy Spark, MD  colchicine 0.6 MG tablet Take 1 tablet (0.6 mg total) by mouth 2 (two) times daily. 11/04/16   Imogene Burn, PA-C  furosemide (LASIX) 20 MG tablet TAKE ONE TABLET BY MOUTH ONCE DAILY AS NEEDED FOR  FLUID  OR  EDEMA 02/25/17   Dorothy Spark, MD  loratadine (CLARITIN) 10 MG tablet Take 10 mg by mouth daily.    [provider]  methimazole (TAPAZOLE) 5 MG tablet Take 2.5 mg by mouth daily.  [provider]  nitroGLYCERIN (NITROSTAT) 0.4 MG SL tablet DISSOLVE ONE TABLET UNDER THE TONGUE EVERY 5 MINUTES AS NEEDED FOR CHEST PAIN.  DO NOT EXCEED A TOTAL OF 3 DOSES IN 15 MINUTES 11/03/15   Dorothy Spark, MD  potassium chloride (K-DUR) 10 MEQ tablet TAKE ONE TABLET BY MOUTH ONCE DAILY AS NEEDED. ONLY TAKE WHEN TAKING AS NEEDED FUROSEMIDE. 02/25/17   Dorothy Spark, MD  pravastatin (PRAVACHOL) 40 MG tablet TAKE ONE TABLET BY MOUTH ONCE DAILY 02/25/17   Dorothy Spark, MD     Family History  Problem Relation Age of Onset  . Breast cancer Mother   . Hypertension Brother   . Kidney failure Brother   . Hypertension Sister     Social History   Socioeconomic  History  . Marital status: Single    Spouse name: Not on file  . Number of children: Not on file  . Years of education: Not on file  . Highest education level: Not on file  Occupational History  . Not on file  Social Needs  . Financial resource strain: Not on file  . Food insecurity:    Worry: Not on file    Inability: Not on file  . Transportation needs:    Medical: Not on file    Non-medical: Not on file  Tobacco Use  . Smoking status: Former Smoker    Packs/day: 0.50    Years: 31.00    Pack years: 15.50    Types: Cigarettes    Last attempt to quit: 02/05/2013    Years since quitting: 4.3  . Smokeless tobacco: Never Used  Substance and Sexual Activity  . Alcohol use: Yes    Alcohol/week: 0.0 oz    Comment: rarely  . Drug use: Yes    Frequency: 2.0 times per week    Types: Marijuana    Comment: Daily Use  . Sexual activity: Yes    Birth control/protection: None, Surgical  Lifestyle  . Physical activity:    Days per week: Not on file    Minutes per session: Not on file  . Stress: Not on file  Relationships  . Social connections:    Talks on phone: Not on file    Gets together: Not on file    Attends religious service: Not on file    Active member of club or organization: Not on file    Attends meetings of clubs or organizations: Not on file    Relationship status: Not on file  Other Topics Concern  . Not on file  Social History Narrative   Lives in [place]    Works in child care.     Review of Systems: A 12 point ROS discussed and pertinent positives are indicated in the HPI above.  All other systems are negative.  Review of Systems  Constitutional: Negative.   HENT: Negative for sore throat, trouble swallowing and voice change.   Respiratory: Negative for choking.   Cardiovascular: Negative for chest pain.  Gastrointestinal: Negative for abdominal pain.  Psychiatric/Behavioral: Negative for behavioral problems and confusion.    Vital Signs: BP  115/75   Pulse 85   Temp 98.5 F (36.9 C) (Oral)   Ht 5\' 3"  (1.6 m)   Wt 120 lb (54.4 kg)   LMP 01/31/2014   SpO2 100%   BMI 21.26 kg/m   Physical Exam  Constitutional: She is oriented to person, place, and time.  Cardiovascular: Normal rate, regular rhythm and normal heart sounds.  Pulmonary/Chest: Effort normal and breath sounds normal.  Abdominal: Soft. Bowel sounds are normal.  Musculoskeletal: Normal range of motion.  Neurological: She is alert and oriented to person, place, and time.  Skin: Skin is warm and dry.  Psychiatric: She has a normal mood and affect. Her behavior is normal. Judgment and thought content normal.  Nursing note and vitals reviewed.   Imaging: No results found.  Labs:  CBC: No results for input(s): WBC, HGB, HCT, PLT in the last 8760 hours.  COAGS: No results for input(s): INR, APTT in the last 8760 hours.  BMP: No results for input(s): NA, K, CL, CO2, GLUCOSE, BUN, CALCIUM, CREATININE, GFRNONAA, GFRAA in the last 8760 hours.  Invalid input(s): CMP  LIVER FUNCTION TESTS: No results for input(s): BILITOT, AST, ALT, ALKPHOS, PROT, ALBUMIN in the last 8760 hours.  TUMOR MARKERS: No results for input(s): AFPTM, CEA, CA199, CHROMGRNA in the last 8760 hours.  Assessment and Plan:  Left thyroid nodule Meeting Criteria for FNA Procedure to be done WITH sedation-- per pt request  Risks and benefits discussed with the patient including, but not limited to bleeding, infection, damage to adjacent structures or low yield requiring additional tests.  All of the patient's questions were answered, patient is agreeable to proceed. Consent signed and in chart.   Thank you for this interesting consult.  I greatly enjoyed meeting [name] [name] and look forward to participating in their care.  A copy of this report was sent to the requesting provider on this date.  Electronically Signed: Lavonia Drafts, PA-C 05/27/2017, 12:10 PM   I spent a total  of  30 Minutes   in face to face in clinical consultation, greater than 50% of which was counseling/coordinating care for left thyroid nodule bx

## 2017-05-27 NOTE — Procedures (Signed)
  Procedure: [place] FNA L thyroid nodule 25g x5   EBL:   minimal Complications:  none immediate  See full dictation in [facility].  Dillard Cannon MD Main # [phone] Pager  [phone]

## 2017-05-27 NOTE — Discharge Instructions (Signed)
Thyroid Biopsy °The thyroid gland is a butterfly-shaped gland located in the front of the neck. It produces hormones that affect metabolism, growth and development, and body temperature. Thyroid biopsy is a procedure in which small samples of tissue or fluid are removed from the thyroid gland. The samples are then looked at under a microscope to check for abnormalities. This procedure is done to determine the cause of thyroid problems. It may be done to check for infection, cancer, or other thyroid problems. °Two methods may be used for a thyroid biopsy. In one method, a thin needle is inserted through the skin and into the thyroid gland. In the other method, an open incision is made through the skin. °Tell a health care provider about: °· Any allergies you have. °· All medicines you are taking, including vitamins, herbs, eye drops, creams, and over-the-counter medicines. °· Any problems you or family members have had with anesthetic medicines. °· Any blood disorders you have. °· Any surgeries you have had. °· Any medical conditions you have. °What are the risks? °Generally, this is a safe procedure. However, problems can occur and include: °· Bleeding from the procedure site. °· Infection. °· Injury to structures near the thyroid gland. ° °What happens before the procedure? °· Ask your health care provider about: °? Changing or stopping your regular medicines. This is especially important if you are taking diabetes medicines or blood thinners. °? Taking medicines such as aspirin and ibuprofen. These medicines can thin your blood. Do not take these medicines before your procedure if your health care provider asks you not to. °· Do not eat or drink anything after midnight on the night before the procedure or as directed by your health care provider. °· You may have a blood sample taken. °What happens during the procedure? °Either of these methods may be used to perform a thyroid biopsy: °· Fine needle biopsy. You may  be given medicine to help you relax (sedative). You will be asked to lie on your back with your head tipped backward to extend your neck. An area on your neck will be cleaned. A needle will then be inserted through the skin of your neck. You may be asked to avoid coughing, talking, swallowing, or making sounds during some portions of the procedure. The needle will be withdrawn once the tissue or fluid samples have been removed. Pressure may be applied to your neck to reduce swelling and ensure that bleeding has stopped. The samples will be sent to a lab for examination. °· Open biopsy. You will be given medicine to make you sleep (general anesthetic). An incision will be made in your neck. A sample of thyroid tissue will be removed using surgical tools. The tissue sample will be sent for examination. In some cases, the sample may be examined during the biopsy. If that is done and cancer cells are found, some or all of the thyroid gland may be removed. The incision will be closed with stitches. ° °What happens after the procedure? °· Your recovery will be assessed and monitored. °· You may have soreness and tenderness at the site of the biopsy. This should go away after a few days. °· If you had an open biopsy, you may have a hoarse voice or sore throat for a couple days. °· It is your responsibility to get your test results. °This information is not intended to replace advice given to you by your health care provider. Make sure you discuss any questions you have with   your health care provider. °Document Released: 11/11/2006 Document Revised: 09/17/2015 Document Reviewed: 04/08/2013 °Elsevier Interactive Patient Education © 2018 [employer] Inc[employer] ° °

## 2017-05-29 ENCOUNTER — Ambulatory Visit: Payer: Self-pay | Admitting: Surgery

## 2017-05-30 ENCOUNTER — Telehealth: Payer: Self-pay

## 2017-05-30 NOTE — Telephone Encounter (Signed)
   [facility] Pre-operative Risk Assessment    Request for surgical clearance:  1. What type of surgery is being performed? Left Thyroid Lobectomy    2. When is this surgery scheduled? TBD   3. What type of clearance is required (medical clearance vs. Pharmacy clearance to hold med vs. Both)? Pharmacy clearance to hold  4. Are there any medications that need to be held prior to surgery and how long? Plavix    5. Practice name and name of physician performing surgery? [facility], Dr. Armandina Gemma   6. What is your office phone number [phone]    7.   What is your office fax number [phone] attn Claiborne Billings Dockery,LPN  8.   Anesthesia type (None, local, MAC, general) ? general   Jacinta Shoe 05/30/2017, 4:01 PM  _________________________________________________________________   (provider comments below)

## 2017-05-30 NOTE — Telephone Encounter (Signed)
   Primary Cardiologist: Ena Dawley, MD  Chart reviewed as part of pre-operative protocol coverage. Given past medical history and time since last visit, based on ACC/AHA guidelines, [name] [name] would be at acceptable risk for the planned procedure without further cardiovascular testing.   She may hold plavix 7 days prior to procedure and resume soon afterward.  We would like to wait 30 days on plavix before holding again, and for pt to heal.     I will route this recommendation to the requesting party via Epic fax function and remove from pre-op pool.  Please call with questions.  Cecilie Kicks, NP 05/30/2017, 4:34 PM

## 2017-06-03 ENCOUNTER — Encounter: Payer: Self-pay | Admitting: Physician Assistant

## 2017-06-18 ENCOUNTER — Ambulatory Visit: Payer: Self-pay | Admitting: Surgery

## 2017-06-19 NOTE — Patient Instructions (Addendum)
[name] [name]  06/19/2017   Your procedure is scheduled on: 06-27-17     Report to [facility] Main  Entrance     Report to Admitting at 5:30 AM    Call this number if you have problems the morning of surgery [phone]     Remember: Do not eat food or drink liquids :After Midnight.     Take these medicines the morning of surgery with A SIP OF WATER: Carvedilol (Coreg), Colchicine, Loratadine (Claritin), and Methimazole (Tapazole)                                You may not have any metal on your body including hair pins and              piercings  Do not wear jewelry, make-up, lotions, powders or perfumes, deodorant             Do not wear nail polish.  Do not shave  48 hours prior to surgery.     Do not bring valuables to the hospital. [place].  Contacts, dentures or bridgework may not be worn into surgery.  Leave suitcase in the car. After surgery it may be brought to your room.       Special Instructions: N/A              Please read over the following fact sheets you were given: _____________________________________________________________________         [facility] - Preparing for Surgery Before surgery, you can play an important role.  Because skin is not sterile, your skin needs to be as free of germs as possible.  You can reduce the number of germs on your skin by washing with CHG (chlorahexidine gluconate) soap before surgery.  CHG is an antiseptic cleaner which kills germs and bonds with the skin to continue killing germs even after washing. Please DO NOT use if you have an allergy to CHG or antibacterial soaps.  If your skin becomes reddened/irritated stop using the CHG and inform your nurse when you arrive at Short Stay. Do not shave (including legs and underarms) for at least 48 hours prior to the first CHG shower.  You may shave your face/neck. Please follow these instructions  carefully:  1.  Shower with CHG Soap the night before surgery and the  morning of Surgery.  2.  If you choose to wash your hair, wash your hair first as usual with your  normal  shampoo.  3.  After you shampoo, rinse your hair and body thoroughly to remove the  shampoo.                           4.  Use CHG as you would any other liquid soap.  You can apply chg directly  to the skin and wash                       Gently with a scrungie or clean washcloth.  5.  Apply the CHG Soap to your body ONLY FROM THE NECK DOWN.   Do not use on face/ open  Wound or open sores. Avoid contact with eyes, ears mouth and genitals (private parts).                       Wash face,  Genitals (private parts) with your normal soap.             6.  Wash thoroughly, paying special attention to the area where your surgery  will be performed.  7.  Thoroughly rinse your body with warm water from the neck down.  8.  DO NOT shower/wash with your normal soap after using and rinsing off  the CHG Soap.                9.  Pat yourself dry with a clean towel.            10.  Wear clean pajamas.            11.  Place clean sheets on your bed the night of your first shower and do not  sleep with pets. Day of Surgery : Do not apply any lotions/deodorants the morning of surgery.  Please wear clean clothes to the hospital/surgery center.  FAILURE TO FOLLOW THESE INSTRUCTIONS MAY RESULT IN THE CANCELLATION OF YOUR SURGERY PATIENT SIGNATURE_________________________________  NURSE SIGNATURE__________________________________  ________________________________________________________________________

## 2017-06-19 NOTE — Progress Notes (Signed)
05-30-17 (Epic) Cardiac clearance from Dr. Meda Coffee in Telephone Encounter  11-04-16 (Epic)

## 2017-06-20 ENCOUNTER — Encounter (HOSPITAL_COMMUNITY): Payer: Self-pay

## 2017-06-20 ENCOUNTER — Other Ambulatory Visit: Payer: Self-pay

## 2017-06-20 ENCOUNTER — Encounter (HOSPITAL_COMMUNITY)
Admission: RE | Admit: 2017-06-20 | Discharge: 2017-06-20 | Disposition: A | Discharge status: Home / Self Care | Payer: BLUE CROSS/BLUE SHIELD | Source: Ambulatory Visit | Attending: Surgery | Admitting: Surgery

## 2017-06-20 ENCOUNTER — Ambulatory Visit (HOSPITAL_COMMUNITY)
Admission: RE | Admit: 2017-06-20 | Discharge: 2017-06-20 | Disposition: A | Discharge status: Home / Self Care | Payer: BLUE CROSS/BLUE SHIELD | Source: Ambulatory Visit | Attending: Anesthesiology | Admitting: Anesthesiology

## 2017-06-20 DIAGNOSIS — Z01818 Encounter for other preprocedural examination: Secondary | ICD-10-CM | POA: Diagnosis not present

## 2017-06-20 DIAGNOSIS — E0590 Thyrotoxicosis, unspecified without thyrotoxic crisis or storm: Secondary | ICD-10-CM | POA: Insufficient documentation

## 2017-06-20 DIAGNOSIS — E041 Nontoxic single thyroid nodule: Secondary | ICD-10-CM | POA: Diagnosis not present

## 2017-06-20 DIAGNOSIS — Z01812 Encounter for preprocedural laboratory examination: Secondary | ICD-10-CM | POA: Insufficient documentation

## 2017-06-20 DIAGNOSIS — Z01811 Encounter for preprocedural respiratory examination: Secondary | ICD-10-CM

## 2017-06-20 HISTORY — DX: Migraine, unspecified, not intractable, without status migrainosus: G43.909

## 2017-06-20 HISTORY — DX: Thyrotoxicosis, unspecified without thyrotoxic crisis or storm: E05.90

## 2017-06-20 HISTORY — DX: Prediabetes: R73.03

## 2017-06-20 LAB — BASIC METABOLIC PANEL
Anion gap: 8 (ref 5–15)
BUN: 15 mg/dL (ref 6–20)
CALCIUM: 9.4 mg/dL (ref 8.9–10.3)
CHLORIDE: 105 mmol/L (ref 101–111)
CO2: 26 mmol/L (ref 22–32)
CREATININE: 1.05 mg/dL — AB (ref 0.44–1.00)
GFR calc Af Amer: >60 mL/min (ref >60)
GFR calc non Af Amer: >60 mL/min (ref >60)
Glucose, Bld: 102 mg/dL — ABNORMAL HIGH (ref 65–99)
Potassium: 3.9 mmol/L (ref 3.5–5.1)
SODIUM: 139 mmol/L (ref 135–145)

## 2017-06-20 LAB — CBC
HCT: 40.9 % (ref 36.0–46.0)
HEMOGLOBIN: 13.4 g/dL (ref 12.0–15.0)
MCH: 28 pg (ref 26.0–34.0)
MCHC: 32.8 g/dL (ref 30.0–36.0)
MCV: 85.4 fL (ref 78.0–100.0)
PLATELETS: 200 10*3/uL (ref 150–400)
RBC: 4.79 MIL/uL (ref 3.87–5.11)
RDW: 13.2 % (ref 11.5–15.5)
WBC: 5.9 10*3/uL (ref 4.0–10.5)

## 2017-06-24 ENCOUNTER — Ambulatory Visit (INDEPENDENT_AMBULATORY_CARE_PROVIDER_SITE_OTHER): Payer: BLUE CROSS/BLUE SHIELD | Admitting: Physician Assistant

## 2017-06-24 ENCOUNTER — Encounter: Payer: Self-pay | Admitting: Physician Assistant

## 2017-06-24 VITALS — BP 120/70 | HR 85 | Ht 63.0 in | Wt 116.0 lb

## 2017-06-24 DIAGNOSIS — I2510 Atherosclerotic heart disease of native coronary artery without angina pectoris: Secondary | ICD-10-CM

## 2017-06-24 DIAGNOSIS — Z01818 Encounter for other preprocedural examination: Secondary | ICD-10-CM | POA: Diagnosis not present

## 2017-06-24 DIAGNOSIS — I10 Essential (primary) hypertension: Secondary | ICD-10-CM

## 2017-06-24 DIAGNOSIS — E782 Mixed hyperlipidemia: Secondary | ICD-10-CM

## 2017-06-24 DIAGNOSIS — I300 Acute nonspecific idiopathic pericarditis: Secondary | ICD-10-CM | POA: Diagnosis not present

## 2017-06-24 NOTE — Patient Instructions (Signed)
Medication Instructions:  Your physician has recommended you make the following change in your medication:  1. Discontinue cholchine    Labwork: -None  Testing/Procedures: -None  Follow-Up: Your physician wants you to follow-up in: 5 months with Dr. Meda Coffee.  You will receive a reminder letter in the mail two months in advance. If you don't receive a letter, please call our office to schedule the follow-up appointment.   Any Other Special Instructions Will Be Listed Below (If Applicable).     If you need a refill on your cardiac medications before your next appointment, please call your pharmacy.

## 2017-06-24 NOTE — Progress Notes (Signed)
Cardiology Office Note    Date:  06/24/2017   ID:  BRADEE COMMON, DOB 08/02/1965, MRN [id number]  PCP:  Tonette Bihari, MD  Cardiologist: Ena Dawley, MD  No chief complaint on file.   History of Present Illness:  CLEOTILDE SPADACCINI is a 52 y.o. female  with history of NSTEMI with VF nonocclusive intraluminal thrombus managed with heparin in the setting of cocaine use in 2015, normal LVEF 65%. History of recurrent pericarditis in 2017 and last seen by Dr. Meda Coffee 04/15/16 and was doing well. Patient also has HLD.  I saw the patient 10/2016 at which time she had been complaining of chest pain similar to her pericarditis and colchicine had been called in for her.  She was feeling better.  Plan was to continue colchicine for 3 months.  EKG was normal.   She is scheduled for left thyroid lobectomy 06/27/2017 and has already been cleared from cardiology.  She was told she could hold her Plavix 7 days prior to procedure and resume soon afterward.  Patient was initially scheduled in April but the surgery was canceled and Plavix resumed.   Patient comes in today for routine follow-up.  She still taking the colchicine and does not want to stop it until after her surgery.  She denies any chest pain, dyspnea, dyspnea on exertion, dizziness or presyncope.  She says when she misses a dose of her medications she notices palpitations but is fine if she takes her medication.  She thought the colchicine was helping her palpitations and not the Coreg.    Past Medical History:  Diagnosis Date  . Abnormal Pap smear    Colpo (twice)  . Anemia   . Anxiety and depression 01/01/2014  . Breast disorder    soreness in left breast   . CAD (coronary artery disease)    a. 01/2013 NSTEMI/Cath: LM nl, LAD 7m, LCX nl, RI nl, RCA nl, PDA ostial nonocclusive intraluminal thrombus, EF 65%, Treated with heparin x 48 hrs->Med Rx (occurred in setting of + cocaine);  b. 01/2013 Echo: EF 60-65%, nl wall motion.  .  Chlamydia    years ago  . Depression   . Heart disease    MI Jan 2015  . High cholesterol   . Hypertension    denies   . Hyperthyroidism   . Migraines    occ  . Myocardial infarction ([place])   . Polysubstance abuse ([place])    tobacco/cocaine/marijuana (+ UDS 01/2013).  . Pre-diabetes   . Thyroid disease   . Trichomonas    years ago    Past Surgical History:  Procedure Laterality Date  . BREAST EXCISIONAL BIOPSY Right   . BREAST SURGERY     lump removed from right breast  . CERVICAL CONE BIOPSY    . CESAREAN SECTION     2 c-sections  . ECTOPIC PREGNANCY SURGERY    . ENDOMETRIAL ABLATION W/ NOVASURE    . GROIN MASS OPEN BIOPSY    . LEFT HEART CATHETERIZATION WITH CORONARY ANGIOGRAM N/A 02/08/2013   Procedure: LEFT HEART CATHETERIZATION WITH CORONARY ANGIOGRAM;  Surgeon: Sinclair Grooms, MD;  Location: [facility] CATH LAB;  Service: Cardiovascular;  Laterality: N/A;  . TUBAL LIGATION      Current Medications: Current Meds  Medication Sig  . acetaminophen (TYLENOL) 500 MG tablet Take 1,000 mg by mouth 2 (two) times daily as needed for mild pain.   Marland Kitchen albuterol (PROAIR HFA) 108 (90 Base) MCG/ACT inhaler INHALE TWO  PUFFS INTO LUNGS EVERY 4 HOURS AS NEEDED FOR WHEEZING FOR SHORTNESS OF BREATH  . aspirin EC 81 MG EC tablet Take 1 tablet (81 mg total) by mouth daily.  . budesonide-formoterol (SYMBICORT) 160-4.5 MCG/ACT inhaler Inhale 2 puffs into the lungs 2 (two) times daily. (Patient taking differently: Inhale 2 puffs into the lungs daily as needed (shortness of breath). )  . carvedilol (COREG) 3.125 MG tablet TAKE ONE TABLET BY MOUTH TWICE DAILY WITH MEALS  . cholecalciferol (VITAMIN D) 1000 units tablet Take 1,000 Units by mouth daily.  . clopidogrel (PLAVIX) 75 MG tablet TAKE ONE TABLET BY MOUTH ONCE DAILY WITH  BREAKFAST  . furosemide (LASIX) 20 MG tablet TAKE ONE TABLET BY MOUTH ONCE DAILY AS NEEDED FOR  FLUID  OR  EDEMA  . loratadine (CLARITIN) 10 MG tablet Take 10 mg by mouth daily.   . methimazole (TAPAZOLE) 5 MG tablet Take 2.5 mg by mouth daily.   . nitroGLYCERIN (NITROSTAT) 0.4 MG SL tablet DISSOLVE ONE TABLET UNDER THE TONGUE EVERY 5 MINUTES AS NEEDED FOR CHEST PAIN.  DO NOT EXCEED A TOTAL OF 3 DOSES IN 15 MINUTES  . potassium chloride (K-DUR) 10 MEQ tablet TAKE ONE TABLET BY MOUTH ONCE DAILY AS NEEDED. ONLY TAKE WHEN TAKING AS NEEDED FUROSEMIDE.  . pravastatin (PRAVACHOL) 40 MG tablet TAKE ONE TABLET BY MOUTH ONCE DAILY  . [DISCONTINUED] colchicine 0.6 MG tablet Take 1 tablet (0.6 mg total) by mouth 2 (two) times daily.     Allergies:   Demerol [meperidine]; Latex; and Novocain [procaine hcl]   Social History   Socioeconomic History  . Marital status: Single    Spouse name: Not on file  . Number of children: Not on file  . Years of education: Not on file  . Highest education level: Not on file  Occupational History  . Not on file  Social Needs  . Financial resource strain: Not on file  . Food insecurity:    Worry: Not on file    Inability: Not on file  . Transportation needs:    Medical: Not on file    Non-medical: Not on file  Tobacco Use  . Smoking status: Former Smoker    Packs/day: 0.50    Years: 31.00    Pack years: 15.50    Types: Cigarettes    Last attempt to quit: 02/05/2013    Years since quitting: 4.3  . Smokeless tobacco: Never Used  Substance and Sexual Activity  . Alcohol use: Yes    Alcohol/week: 0.0 oz    Comment: rarely  . Drug use: Yes    Frequency: 2.0 times per week    Types: Marijuana    Comment: Daily Use  . Sexual activity: Yes    Birth control/protection: None, Surgical  Lifestyle  . Physical activity:    Days per week: Not on file    Minutes per session: Not on file  . Stress: Not on file  Relationships  . Social connections:    Talks on phone: Not on file    Gets together: Not on file    Attends religious service: Not on file    Active member of club or organization: Not on file    Attends meetings of clubs or  organizations: Not on file    Relationship status: Not on file  Other Topics Concern  . Not on file  Social History Narrative   Lives in [place]    Works in child care.  Family History:  The patient's family history includes Breast cancer in her mother; Hypertension in her brother and sister; Kidney failure in her brother.   ROS:   Please see the history of present illness.    Review of Systems  Constitution: Positive for decreased appetite and weight loss.  HENT: Negative.   Eyes: Negative.   Cardiovascular: Positive for palpitations.  Respiratory: Negative.   Hematologic/Lymphatic: Bruises/bleeds easily.  Musculoskeletal: Negative.  Negative for joint pain.  Gastrointestinal: Negative.   Genitourinary: Negative.   Neurological: Negative.    All other systems reviewed and are negative.   PHYSICAL EXAM:   VS:  BP 120/70   Pulse 85   Ht 5\' 3"  (1.6 m)   Wt 116 lb (52.6 kg)   LMP 01/31/2014   SpO2 99%   BMI 20.55 kg/m   Physical Exam  GEN: Thin, in no acute distress  Neck: no JVD, carotid bruits, or masses Cardiac:RRR; no murmurs, rubs, or gallops  Respiratory:  clear to auscultation bilaterally, normal work of breathing GI: soft, nontender, nondistended, + BS Ext: without cyanosis, clubbing, or edema, Good distal pulses bilaterally Neuro:  Alert and Oriented x 3 Psych: euthymic mood, full affect  Wt Readings from Last 3 Encounters:  06/24/17 116 lb (52.6 kg)  06/20/17 118 lb (53.5 kg)  05/27/17 120 lb (54.4 kg)      Studies/Labs Reviewed:   EKG:  EKG is not ordered today.  EKG from 10/2016 normal sinus rhythm normal EKG Recent Labs: 06/20/2017: BUN 15; Creatinine, Ser 1.05; Hemoglobin 13.4; Platelets 200; Potassium 3.9; Sodium 139   Lipid Panel    Component Value Date/Time   CHOL 174 01/12/2016 1018   CHOL 173 03/16/2015 1139   TRIG 81 01/12/2016 1018   TRIG 99 03/16/2015 1139   HDL 45 (L) 01/12/2016 1018   HDL 57 03/16/2015 1139   CHOLHDL 3.9  01/12/2016 1018   VLDL 16 01/12/2016 1018   LDLCALC 113 (H) 01/12/2016 1018   LDLCALC 96 03/16/2015 1139    Additional studies/ records that were reviewed today include:   2-D echo 04/03/15 Study Conclusions   - Left ventricle: The cavity size was normal. Systolic function was   normal. The estimated ejection fraction was in the range of 60%   to 65%. Wall motion was normal; there were no regional wall   motion abnormalities. Left ventricular diastolic function   parameters were normal. - Aortic valve: Transvalvular velocity was within the normal range.   There was no stenosis. There was no regurgitation. - Mitral valve: Transvalvular velocity was within the normal range.   There was no evidence for stenosis. There was no regurgitation. - Right ventricle: The cavity size was normal. Wall thickness was   normal. Systolic function was normal. - Tricuspid valve: There was trivial regurgitation. - Inferior vena cava: The vessel was normal in size. The   respirophasic diameter changes were in the normal range (>= 50%),   consistent with normal central venous pressure.   Nuclear stress test 2015Impression Exercise Capacity:  Good exercise capacity. BP Response:  Hypotensive blood pressure response. Clinical Symptoms:  There is dyspnea. ECG Impression:  No significant ST segment change suggestive of ischemia. Comparison with Prior Nuclear Study: No images to compare   Overall Impression:  Low risk stress nuclear study with a small sized, mild fixed mid and basal inferior defect that is most consistent with diaphragmatic attenuation and increased gut uptake.  No evidence of ischemia.  Of note the patient's  BP was 116/72mmHg in stage 3 and decreased to 140/63mmHg in Stage 2.  The next BP was in recovery at 173/36mmHg.   LV Ejection Fraction: 63%.  LV Wall Motion:  NL LV Function; NL Wall Motion   Signed: Fransico Him, MD [facility] 11/10/2013      ASSESSMENT:    1. Idiopathic  pericarditis, unspecified chronicity   2. Coronary artery disease involving native coronary artery of native heart without angina pectoris   3. Essential hypertension   4. Mixed hyperlipidemia   5. Preoperative clearance      PLAN:  In order of problems listed above:  History of pericarditis 3 times resolved with colchicine.  Still taking colchicine I told her she could stop it.  She is reluctant to do this until after her surgery which I said is reasonable.  She is only taking it once a day because she gets diarrhea if she takes it twice a day.  Follow-up with Dr. Meda Coffee in 4 to 5 months.  CAD with MI secondary to thrombus and cocaine use in 2015 has been treated with Plavix aspirin and Coreg.  Plavix on hold for surgery 06/27/2017 for thyroidectomy.  To resume after surgery  Essential hypertension controlled with low-dose Coreg  Hyperlipidemia on Pravachol.  Last lipid panel 12/2015.  Will need lipid panel and LFTs.  Preoperative clearance has already been cleared for surgery.  Holding Plavix for 7 days and will resume after.  Surgical risk is low according to the revised cardiac index and no need for further cardiac testing.  METS is 6.45 According to the Revised Cardiac Risk Index (RCRI), her Perioperative Risk of Major Cardiac Event is (%): 0.4  Her Functional Capacity in METs is: 6.45 according to the [facility] Activity Status Index (DASI).    Medication Adjustments/Labs and Tests Ordered: Current medicines are reviewed at length with the patient today.  Concerns regarding medicines are outlined above.  Medication changes, Labs and Tests ordered today are listed in the Patient Instructions below. Patient Instructions  Medication Instructions:  Your physician has recommended you make the following change in your medication:  1. Discontinue cholchine    Labwork: -None  Testing/Procedures: -None  Follow-Up: Your physician wants you to follow-up in: 5 months with Dr. Meda Coffee.  You  will receive a reminder letter in the mail two months in advance. If you don't receive a letter, please call our office to schedule the follow-up appointment.   Any Other Special Instructions Will Be Listed Below (If Applicable).     If you need a refill on your cardiac medications before your next appointment, please call your pharmacy.      Sumner Boast, PA-C  06/24/2017 10:39 AM    [facility] [address], [place], [place]  [address] Phone: [phone]; Fax: [phone]

## 2017-06-26 ENCOUNTER — Encounter (HOSPITAL_COMMUNITY): Payer: Self-pay | Admitting: Certified Registered Nurse Anesthetist

## 2017-06-26 DIAGNOSIS — E0590 Thyrotoxicosis, unspecified without thyrotoxic crisis or storm: Secondary | ICD-10-CM | POA: Diagnosis present

## 2017-06-26 DIAGNOSIS — E041 Nontoxic single thyroid nodule: Secondary | ICD-10-CM | POA: Diagnosis present

## 2017-06-26 NOTE — Anesthesia Preprocedure Evaluation (Addendum)
Anesthesia Evaluation  Patient identified by MRN, date of birth, ID band Patient awake    Reviewed: Allergy & Precautions, H&P , NPO status , Patient's Chart, lab work & pertinent test results, reviewed documented beta blocker date and time   Airway Mallampati: II  TM Distance: >3 FB Neck ROM: full    Dental no notable dental hx. (+) Poor Dentition, Loose, Missing, Chipped,    Pulmonary asthma , COPD,  COPD inhaler, former smoker,    Pulmonary exam normal breath sounds clear to auscultation       Cardiovascular Exercise Tolerance: Good hypertension, Pt. on medications + angina + CAD and + Past MI   Rhythm:regular Rate:Normal  01/2013 NSTEMI/Cath: LM nl, LAD 37m, LCX nl, RI nl, RCA nl, PDA ostial nonocclusive intraluminal thrombus, EF 65%, Treated with heparin x 48 hrs->Med Rx (occurred in setting of + cocaine);  b. 01/2013 Echo: EF 60-65%, nl wall motion.   Neuro/Psych  Headaches, Anxiety Depression    GI/Hepatic negative GI ROS, (+)     substance abuse  cocaine use,   Endo/Other  Hyperthyroidism   Renal/GU negative Renal ROS  negative genitourinary   Musculoskeletal   Abdominal   Peds  Hematology negative hematology ROS (+) anemia ,   Anesthesia Other Findings   Reproductive/Obstetrics negative OB ROS                            Anesthesia Physical Anesthesia Plan  ASA: III  Anesthesia Plan: General   Post-op Pain Management:    Induction: Intravenous  PONV Risk Score and Plan: 3 and Ondansetron, Treatment may vary due to age or medical condition and Dexamethasone  Airway Management Planned: Oral ETT  Additional Equipment:   Intra-op Plan:   Post-operative Plan: Extubation in OR  Informed Consent: I have reviewed the patients History and Physical, chart, labs and discussed the procedure including the risks, benefits and alternatives for the proposed anesthesia with the patient  or authorized representative who has indicated his/her understanding and acceptance.   Dental Advisory Given  Plan Discussed with: CRNA, Anesthesiologist and Surgeon  Anesthesia Plan Comments: (  )        Anesthesia Quick Evaluation

## 2017-06-26 NOTE — H&P (Signed)
General Surgery [name]  [name] [name] DOB: Oct 09, 1965 Single / Language: Cleophus Molt / Race: Black or African American Female   History of Present Illness  The patient is a 52 year old female who presents with a thyroid nodule.  CC: left thyroid nodule, hx of hyperthyroidism  Patient is referred by Dr. Vicie Mutters for surgical evaluation and management of newly diagnosed left thyroid nodule and history of hyperthyroidism. Patient was diagnosed with hyperthyroidism and a little over one year ago. Patient was seen by Dr. Jeanann Lewandowsky and started on Tapazole. She had a nuclear medicine scan on February 07, 2016. This showed increased tracer localization within the left thyroid lobe consistent with a hyperfunctioning adenoma. Unfortunately, Dr. Carlis Abbott has retired and the patient has not found subsequent endocrinologic follow-up. She remains on Tapazole 2.5 mg daily. She has not had a recent TSH level. Patient was seen by Dr. Thornell Mule for a suspected parotid mass. She underwent ultrasound and MRI scan again identifying the left thyroid nodule. Thyroid ultrasound from April 15, 2017 shows a solitary nodule in the mid left thyroid lobe measuring 3.4 x 1.8 x 2.0 cm. This was felt to be mildly suspicious and fine-needle aspiration biopsy was recommended. That procedure is pending. Patient denies any compressive symptoms. She denies tremors. She does note palpitations. She has had no prior head or neck surgery. She presents today accompanied by her brother. Patient does have a history of coronary artery disease and had a myocardial infarction in 2015. She takes Plavix.   Past Surgical History Breast Biopsy  Right. Cesarean Section - Multiple  Tonsillectomy   Diagnostic Studies History Colonoscopy  never Mammogram  1-3 years ago Pap Smear  1-5 years ago  Allergies Novocain *LOCAL ANESTHETICS-Parenteral*  Hives. Latex  Hives.  Medication  History Carvedilol (3.125MG  Tablet, 1 Oral daily) Active. Pravastatin Sodium (10MG  Tablet, 1 Oral daily) Active. Albuterol Sulfate (0.63MG /3ML Nebulized Soln, Inhalation) Active. Plavix (75MG  Tablet, 1 Oral daily) Active. Vitamin D (Ergocalciferol) (50000UNIT Capsule, Oral) Active. MethIMAzole (5MG  Tablet, Oral) Active. Colchicine (0.6MG  Capsule, Oral) Active. Allergy (Oral) Specific strength unknown - Active. Aspirin (81MG  Tablet DR, Oral) Active. Medications Reconciled  Social History Alcohol use  Occasional alcohol use. Caffeine use  Coffee. Illicit drug use  Uses socially only. Tobacco use  Former smoker.  Family History  Breast Cancer  Mother. Diabetes Mellitus  Father. Heart Disease  Father. Hypertension  Brother, Father, Sister. Kidney Disease  Brother.  Pregnancy / Birth History Age at menarche  68 years. Age of menopause  73-50 Gravida  4 Irregular periods  Maternal age  59-20 Para  2  Other Problems Anxiety Disorder  Asthma  Chronic Obstructive Lung Disease  Depression  Hypercholesterolemia  Myocardial infarction  Thyroid Disease   Review of Systems General Present- Appetite Loss, Fatigue, Night Sweats and Weight Loss. Not Present- Chills, Fever and Weight Gain. Skin Present- Dryness. Not Present- Change in Wart/Mole, Hives, Jaundice, New Lesions, Non-Healing Wounds, Rash and Ulcer. HEENT Present- Seasonal Allergies and Wears glasses/contact lenses. Not Present- Earache, Hearing Loss, Hoarseness, Nose Bleed, Oral Ulcers, Ringing in the Ears, Sinus Pain, Sore Throat, Visual Disturbances and Yellow Eyes. Cardiovascular Present- Palpitations. Not Present- Chest Pain, Difficulty Breathing Lying Down, Leg Cramps, Rapid Heart Rate, Shortness of Breath and Swelling of Extremities. Gastrointestinal Present- Gets full quickly at meals. Not Present- Abdominal Pain, Bloating, Bloody Stool, Change in Bowel Habits, Chronic diarrhea,  Constipation, Difficulty Swallowing, Excessive gas, Hemorrhoids, Indigestion, Nausea, Rectal Pain and Vomiting. Female  Genitourinary Not Present- Frequency, Nocturia, Painful Urination, Pelvic Pain and Urgency. Musculoskeletal Present- Back Pain. Not Present- Joint Pain, Joint Stiffness, Muscle Pain, Muscle Weakness and Swelling of Extremities. Neurological Not Present- Decreased Memory, Fainting, Headaches, Numbness, Seizures, Tingling, Tremor, Trouble walking and Weakness. Psychiatric Present- Anxiety, Change in Sleep Pattern and Depression. Not Present- Bipolar, Fearful and Frequent crying. Endocrine Present- Hot flashes. Not Present- Cold Intolerance, Excessive Hunger, Hair Changes, Heat Intolerance and New Diabetes.  Vitals Weight: 120.25 lb Height: 63in Body Surface Area: 1.56 m Body Mass Index: 21.3 kg/m  Temp.: 98.70F  Pulse: 94 (Regular)  BP: 110/70 (Sitting, Left Arm, Standard)  Physical Exam  See vital signs recorded above  GENERAL APPEARANCE Development: normal Nutritional status: normal Gross deformities: none  SKIN Rash, lesions, ulcers: none Induration, erythema: none Nodules: none palpable  EYES Conjunctiva and lids: normal Pupils: equal and reactive Iris: normal bilaterally  EARS, NOSE, MOUTH, THROAT External ears: no lesion or deformity External nose: no lesion or deformity Hearing: grossly normal Lips: no lesion or deformity Dentition: normal for age Oral mucosa: moist  NECK Symmetric: no Trachea: midline Thyroid: Right thyroid lobe is without palpable abnormality. There is a soft nodule in the mid left thyroid lobe measuring at least 2 cm in greatest dimension, smooth, nontender, mobile with swallowing.  CHEST Respiratory effort: normal Retraction or accessory muscle use: no Breath sounds: normal bilaterally Rales, rhonchi, wheeze: none  CARDIOVASCULAR Auscultation: regular rhythm, normal rate Murmurs: none Pulses: carotid and  radial pulse 2+ palpable Lower extremity edema: none Lower extremity varicosities: none  MUSCULOSKELETAL Station and gait: normal Digits and nails: no clubbing or cyanosis Muscle strength: grossly normal all extremities Range of motion: grossly normal all extremities Deformity: none  LYMPHATIC Cervical: none palpable Supraclavicular: none palpable  PSYCHIATRIC Oriented to person, place, and time: yes Mood and affect: normal for situation Judgment and insight: appropriate for situation    Assessment & Plan  THYROID NODULE (E04.1)  Pt Education - Pamphlet Given - The Thyroid Book: discussed with patient and provided information. Follow Up - Call [facility] office after tests / studies doneto discuss further plans  Patient presents today on referral from her ENT specialist for evaluation of left-sided thyroid nodule and hyperthyroidism. She is accompanied by her brother. She is provided with written literature on thyroid surgery to review at home.  Patient has a dominant solitary nodule in the left thyroid lobe which appears to be hyperfunctioning based on previous nuclear medicine scanning. She is scheduled to undergo fine-needle aspiration biopsy in the near future.  We will check a TSH level today. We will make sure her biopsy is performed in the near future. I will contact her with these results. Patient will likely require left thyroid lobectomy if not total thyroidectomy depending on the results of these studies.  We will also assist the patient in establishing a relationship with a new endocrinologist. We discussed seeing Dr. Carrolyn Meiers in the near future. We will assist with those arrangements when needed.  Patient will undergo biopsy and obtain laboratory studies. We will contact her with her results. She will return to the office in 2-3 weeks to discuss the findings and to make a decision regarding surgical intervention.  HYPERTHYROIDISM (E05.90)  Telephone call  to patient with results of fine-needle aspiration biopsy performed on May 27, 2017. This is a benign thyroid nodule.  Will renew prescription for Tapazole 2.5 mg daily until the time of surgery.  Recommend proceeding with left thyroid lobectomy for definitive  management of thyroid nodule and control of hyperthyroidism.  The risks and benefits of the procedure have been discussed at length with the patient. The patient understands the proposed procedure, potential alternative treatments, and the course of recovery to be expected. All of the patient's questions have been answered at this time. The patient wishes to proceed with surgery.  Sent to scheduling for posting.  Armandina Gemma, [facility] Office: [phone]

## 2017-06-27 ENCOUNTER — Encounter (HOSPITAL_COMMUNITY): Payer: Self-pay

## 2017-06-27 ENCOUNTER — Encounter (HOSPITAL_COMMUNITY)
Admission: RE | Disposition: A | Discharge status: Home / Self Care | Payer: Self-pay | Source: Ambulatory Visit | Attending: Surgery

## 2017-06-27 ENCOUNTER — Observation Stay (HOSPITAL_COMMUNITY)
Admission: RE | Admit: 2017-06-27 | Discharge: 2017-06-28 | Disposition: A | Discharge status: Home / Self Care | Payer: BLUE CROSS/BLUE SHIELD | Source: Ambulatory Visit | Attending: Surgery | Admitting: Surgery

## 2017-06-27 ENCOUNTER — Ambulatory Visit (HOSPITAL_COMMUNITY): Payer: BLUE CROSS/BLUE SHIELD | Admitting: Certified Registered Nurse Anesthetist

## 2017-06-27 ENCOUNTER — Other Ambulatory Visit: Payer: Self-pay

## 2017-06-27 DIAGNOSIS — E0520 Thyrotoxicosis with toxic multinodular goiter without thyrotoxic crisis or storm: Secondary | ICD-10-CM | POA: Diagnosis not present

## 2017-06-27 DIAGNOSIS — Z7902 Long term (current) use of antithrombotics/antiplatelets: Secondary | ICD-10-CM | POA: Insufficient documentation

## 2017-06-27 DIAGNOSIS — J449 Chronic obstructive pulmonary disease, unspecified: Secondary | ICD-10-CM | POA: Insufficient documentation

## 2017-06-27 DIAGNOSIS — Z8249 Family history of ischemic heart disease and other diseases of the circulatory system: Secondary | ICD-10-CM | POA: Diagnosis not present

## 2017-06-27 DIAGNOSIS — Z841 Family history of disorders of kidney and ureter: Secondary | ICD-10-CM | POA: Diagnosis not present

## 2017-06-27 DIAGNOSIS — Z803 Family history of malignant neoplasm of breast: Secondary | ICD-10-CM | POA: Insufficient documentation

## 2017-06-27 DIAGNOSIS — Z79899 Other long term (current) drug therapy: Secondary | ICD-10-CM | POA: Diagnosis not present

## 2017-06-27 DIAGNOSIS — E0590 Thyrotoxicosis, unspecified without thyrotoxic crisis or storm: Secondary | ICD-10-CM | POA: Diagnosis present

## 2017-06-27 DIAGNOSIS — Z885 Allergy status to narcotic agent status: Secondary | ICD-10-CM | POA: Diagnosis not present

## 2017-06-27 DIAGNOSIS — Z7951 Long term (current) use of inhaled steroids: Secondary | ICD-10-CM | POA: Diagnosis not present

## 2017-06-27 DIAGNOSIS — Z7982 Long term (current) use of aspirin: Secondary | ICD-10-CM | POA: Diagnosis not present

## 2017-06-27 DIAGNOSIS — E7800 Pure hypercholesterolemia, unspecified: Secondary | ICD-10-CM | POA: Diagnosis not present

## 2017-06-27 DIAGNOSIS — Z87891 Personal history of nicotine dependence: Secondary | ICD-10-CM | POA: Insufficient documentation

## 2017-06-27 DIAGNOSIS — I252 Old myocardial infarction: Secondary | ICD-10-CM | POA: Insufficient documentation

## 2017-06-27 DIAGNOSIS — E041 Nontoxic single thyroid nodule: Secondary | ICD-10-CM | POA: Diagnosis present

## 2017-06-27 HISTORY — PX: THYROID LOBECTOMY: SHX420

## 2017-06-27 SURGERY — LOBECTOMY, THYROID
Anesthesia: General | Site: Neck | Laterality: Left

## 2017-06-27 MED ORDER — TRAMADOL HCL 50 MG PO TABS: 50.0000 mg | ORAL_TABLET | Freq: Four times a day (QID) | ORAL | 0 refills | Status: DC | PRN

## 2017-06-27 MED ORDER — CHLORHEXIDINE GLUCONATE CLOTH 2 % EX PADS: 6.0000 | MEDICATED_PAD | Freq: Once | CUTANEOUS | Status: DC

## 2017-06-27 MED ORDER — MIDAZOLAM HCL 2 MG/2ML IJ SOLN
INTRAMUSCULAR | Status: AC
  Filled 2017-06-27: qty 2

## 2017-06-27 MED ORDER — ALBUTEROL SULFATE (2.5 MG/3ML) 0.083% IN NEBU: 2.5000 mg | INHALATION_SOLUTION | RESPIRATORY_TRACT | Status: DC | PRN

## 2017-06-27 MED ORDER — ONDANSETRON HCL 4 MG/2ML IJ SOLN: 4.0000 mg | Freq: Once | INTRAMUSCULAR | Status: DC | PRN

## 2017-06-27 MED ORDER — ALBUTEROL SULFATE HFA 108 (90 BASE) MCG/ACT IN AERS: 2.0000 | INHALATION_SPRAY | RESPIRATORY_TRACT | Status: DC | PRN

## 2017-06-27 MED ORDER — ONDANSETRON 4 MG PO TBDP: 4.0000 mg | ORAL_TABLET | Freq: Four times a day (QID) | ORAL | Status: DC | PRN

## 2017-06-27 MED ORDER — SUGAMMADEX SODIUM 200 MG/2ML IV SOLN: INTRAVENOUS | Status: DC | PRN

## 2017-06-27 MED ORDER — ACETAMINOPHEN 325 MG PO TABS: 325.0000 mg | ORAL_TABLET | ORAL | Status: DC | PRN

## 2017-06-27 MED ORDER — OXYCODONE HCL 5 MG PO TABS: 5.0000 mg | ORAL_TABLET | Freq: Once | ORAL | Status: DC | PRN

## 2017-06-27 MED ORDER — DEXAMETHASONE SODIUM PHOSPHATE 10 MG/ML IJ SOLN
INTRAMUSCULAR | Status: DC | PRN
  Administered 2017-06-27: 10 mg via INTRAVENOUS

## 2017-06-27 MED ORDER — ROCURONIUM BROMIDE 10 MG/ML (PF) SYRINGE
PREFILLED_SYRINGE | INTRAVENOUS | Status: AC
  Filled 2017-06-27: qty 5

## 2017-06-27 MED ORDER — LIDOCAINE 2% (20 MG/ML) 5 ML SYRINGE
INTRAMUSCULAR | Status: AC
  Filled 2017-06-27: qty 5

## 2017-06-27 MED ORDER — LACTATED RINGERS IV SOLN
INTRAVENOUS | Status: DC | PRN
  Administered 2017-06-27 (×2): via INTRAVENOUS

## 2017-06-27 MED ORDER — ROCURONIUM BROMIDE 100 MG/10ML IV SOLN
INTRAVENOUS | Status: DC | PRN
  Administered 2017-06-27: 60 mg via INTRAVENOUS

## 2017-06-27 MED ORDER — ACETAMINOPHEN 10 MG/ML IV SOLN
1000.0000 mg | Freq: Four times a day (QID) | INTRAVENOUS | Status: DC
  Administered 2017-06-27: 1000 mg via INTRAVENOUS

## 2017-06-27 MED ORDER — PROMETHAZINE HCL 25 MG/ML IJ SOLN
12.5000 mg | INTRAMUSCULAR | Status: DC | PRN
  Administered 2017-06-27: 12.5 mg via INTRAVENOUS
  Filled 2017-06-27: qty 1

## 2017-06-27 MED ORDER — CEFAZOLIN SODIUM-DEXTROSE 2-4 GM/100ML-% IV SOLN
2.0000 g | INTRAVENOUS | Status: AC
  Administered 2017-06-27: 2 g via INTRAVENOUS
  Filled 2017-06-27: qty 100

## 2017-06-27 MED ORDER — TRAMADOL HCL 50 MG PO TABS: 50.0000 mg | ORAL_TABLET | Freq: Four times a day (QID) | ORAL | Status: DC | PRN

## 2017-06-27 MED ORDER — PROPOFOL 10 MG/ML IV BOLUS
INTRAVENOUS | Status: DC | PRN
  Administered 2017-06-27: 120 mg via INTRAVENOUS

## 2017-06-27 MED ORDER — CARVEDILOL 3.125 MG PO TABS
3.1250 mg | ORAL_TABLET | Freq: Two times a day (BID) | ORAL | Status: DC
  Administered 2017-06-27 – 2017-06-28 (×2): 3.125 mg via ORAL
  Filled 2017-06-27 (×2): qty 1

## 2017-06-27 MED ORDER — HYDROCODONE-ACETAMINOPHEN 5-325 MG PO TABS
1.0000 | ORAL_TABLET | ORAL | Status: DC | PRN
  Administered 2017-06-27: 2 via ORAL
  Administered 2017-06-27: 1 via ORAL
  Administered 2017-06-28: 2 via ORAL
  Filled 2017-06-27: qty 2
  Filled 2017-06-27: qty 1
  Filled 2017-06-27: qty 2

## 2017-06-27 MED ORDER — HYDROMORPHONE HCL 1 MG/ML IJ SOLN
1.0000 mg | INTRAMUSCULAR | Status: DC | PRN
  Administered 2017-06-27: 1 mg via INTRAVENOUS
  Filled 2017-06-27: qty 1

## 2017-06-27 MED ORDER — ONDANSETRON HCL 4 MG/2ML IJ SOLN
INTRAMUSCULAR | Status: DC | PRN
  Administered 2017-06-27: 4 mg via INTRAVENOUS

## 2017-06-27 MED ORDER — FENTANYL CITRATE (PF) 250 MCG/5ML IJ SOLN
INTRAMUSCULAR | Status: DC | PRN
  Administered 2017-06-27 (×4): 50 ug via INTRAVENOUS
  Administered 2017-06-27: 100 ug via INTRAVENOUS

## 2017-06-27 MED ORDER — MIDAZOLAM HCL 5 MG/5ML IJ SOLN
INTRAMUSCULAR | Status: DC | PRN
  Administered 2017-06-27: 2 mg via INTRAVENOUS

## 2017-06-27 MED ORDER — SUGAMMADEX SODIUM 200 MG/2ML IV SOLN
INTRAVENOUS | Status: DC | PRN
  Administered 2017-06-27: 200 mg via INTRAVENOUS

## 2017-06-27 MED ORDER — ACETAMINOPHEN 160 MG/5ML PO SOLN: 325.0000 mg | ORAL | Status: DC | PRN

## 2017-06-27 MED ORDER — KCL IN DEXTROSE-NACL 20-5-0.45 MEQ/L-%-% IV SOLN
INTRAVENOUS | Status: DC
  Administered 2017-06-27: 12:00:00 via INTRAVENOUS
  Filled 2017-06-27: qty 1000

## 2017-06-27 MED ORDER — FENTANYL CITRATE (PF) 100 MCG/2ML IJ SOLN
INTRAMUSCULAR | Status: AC
  Filled 2017-06-27: qty 2

## 2017-06-27 MED ORDER — ONDANSETRON HCL 4 MG/2ML IJ SOLN
4.0000 mg | Freq: Four times a day (QID) | INTRAMUSCULAR | Status: DC | PRN
  Administered 2017-06-27: 4 mg via INTRAVENOUS
  Filled 2017-06-27 (×2): qty 2

## 2017-06-27 MED ORDER — ACETAMINOPHEN 325 MG PO TABS: 650.0000 mg | ORAL_TABLET | Freq: Four times a day (QID) | ORAL | Status: DC | PRN

## 2017-06-27 MED ORDER — DEXAMETHASONE SODIUM PHOSPHATE 10 MG/ML IJ SOLN
INTRAMUSCULAR | Status: AC
  Filled 2017-06-27: qty 1

## 2017-06-27 MED ORDER — LIDOCAINE HCL (CARDIAC) PF 100 MG/5ML IV SOSY
PREFILLED_SYRINGE | INTRAVENOUS | Status: DC | PRN
  Administered 2017-06-27: 80 mg via INTRATRACHEAL

## 2017-06-27 MED ORDER — PROPOFOL 10 MG/ML IV BOLUS
INTRAVENOUS | Status: AC
  Filled 2017-06-27: qty 20

## 2017-06-27 MED ORDER — ACETAMINOPHEN 10 MG/ML IV SOLN
INTRAVENOUS | Status: AC
  Filled 2017-06-27: qty 100

## 2017-06-27 MED ORDER — 0.9 % SODIUM CHLORIDE (POUR BTL) OPTIME
TOPICAL | Status: DC | PRN
  Administered 2017-06-27: 1000 mL

## 2017-06-27 MED ORDER — CEFAZOLIN SODIUM-DEXTROSE 2-4 GM/100ML-% IV SOLN: 2.0000 g | INTRAVENOUS | Status: DC

## 2017-06-27 MED ORDER — ONDANSETRON HCL 4 MG/2ML IJ SOLN
INTRAMUSCULAR | Status: AC
  Filled 2017-06-27: qty 2

## 2017-06-27 MED ORDER — ACETAMINOPHEN 650 MG RE SUPP: 650.0000 mg | Freq: Four times a day (QID) | RECTAL | Status: DC | PRN

## 2017-06-27 MED ORDER — FENTANYL CITRATE (PF) 250 MCG/5ML IJ SOLN
INTRAMUSCULAR | Status: AC
  Filled 2017-06-27: qty 5

## 2017-06-27 MED ORDER — OXYCODONE HCL 5 MG/5ML PO SOLN
5.0000 mg | Freq: Once | ORAL | Status: DC | PRN
  Filled 2017-06-27: qty 5

## 2017-06-27 MED ORDER — CARVEDILOL 3.125 MG PO TABS: 3.1250 mg | ORAL_TABLET | ORAL | Status: DC

## 2017-06-27 MED ORDER — FENTANYL CITRATE (PF) 100 MCG/2ML IJ SOLN
25.0000 ug | INTRAMUSCULAR | Status: DC | PRN
  Administered 2017-06-27: 50 ug via INTRAVENOUS
  Administered 2017-06-27 (×2): 25 ug via INTRAVENOUS

## 2017-06-27 MED ORDER — MEPERIDINE HCL 50 MG/ML IJ SOLN: 6.2500 mg | INTRAMUSCULAR | Status: DC | PRN

## 2017-06-27 SURGICAL SUPPLY — 39 items
ATTRACTOMAT 16X20 MAGNETIC DRP (DRAPES) ×2 IMPLANT
BLADE SURG 15 STRL LF DISP TIS (BLADE) ×1 IMPLANT
BLADE SURG 15 STRL SS (BLADE) ×2
CHLORAPREP W/TINT 26ML (MISCELLANEOUS) ×3 IMPLANT
CLIP VESOCCLUDE MED 6/CT (CLIP) ×4 IMPLANT
CLIP VESOCCLUDE SM WIDE 6/CT (CLIP) ×4 IMPLANT
DISSECTOR ROUND CHERRY 3/8 STR (MISCELLANEOUS) IMPLANT
DRAPE LAPAROTOMY T 98X78 PEDS (DRAPES) ×2 IMPLANT
ELECT PENCIL ROCKER SW 15FT (MISCELLANEOUS) ×2 IMPLANT
ELECT REM PT RETURN 15FT ADLT (MISCELLANEOUS) ×2 IMPLANT
GAUZE 4X4 16PLY RFD (DISPOSABLE) ×2 IMPLANT
GAUZE SPONGE 4X4 12PLY STRL (GAUZE/BANDAGES/DRESSINGS) ×1 IMPLANT
GLOVE BIOGEL PI IND STRL 6.5 (GLOVE) IMPLANT
GLOVE BIOGEL PI IND STRL 7.0 (GLOVE) IMPLANT
GLOVE BIOGEL PI INDICATOR 6.5 (GLOVE) ×2
GLOVE BIOGEL PI INDICATOR 7.0 (GLOVE) ×2
GLOVE SURG ORTHO 8.0 STRL STRW (GLOVE) ×2 IMPLANT
GOWN STRL REUS W/TWL LRG LVL3 (GOWN DISPOSABLE) ×1 IMPLANT
GOWN STRL REUS W/TWL XL LVL3 (GOWN DISPOSABLE) ×4 IMPLANT
HEMOSTAT SURGICEL 2X4 FIBR (HEMOSTASIS) ×2 IMPLANT
ILLUMINATOR WAVEGUIDE N/F (MISCELLANEOUS) ×1 IMPLANT
KIT BASIN OR (CUSTOM PROCEDURE TRAY) ×2 IMPLANT
LIGHT WAVEGUIDE WIDE FLAT (MISCELLANEOUS) IMPLANT
PACK BASIC VI WITH GOWN DISP (CUSTOM PROCEDURE TRAY) ×2 IMPLANT
POWDER SURGICEL 3.0 GRAM (HEMOSTASIS) IMPLANT
SHEARS HARMONIC 9CM CVD (BLADE) ×2 IMPLANT
STAPLER VISISTAT 35W (STAPLE) ×2 IMPLANT
STRIP CLOSURE SKIN 1/2X4 (GAUZE/BANDAGES/DRESSINGS) ×2 IMPLANT
SUT MNCRL AB 4-0 PS2 18 (SUTURE) ×2 IMPLANT
SUT SILK 2 0 (SUTURE) ×2
SUT SILK 2-0 18XBRD TIE 12 (SUTURE) ×1 IMPLANT
SUT SILK 3 0 (SUTURE)
SUT SILK 3-0 18XBRD TIE 12 (SUTURE) IMPLANT
SUT VIC AB 3-0 SH 18 (SUTURE) ×2 IMPLANT
SYR BULB IRRIGATION 50ML (SYRINGE) ×2 IMPLANT
TAPE CLOTH SURG 4X10 WHT LF (GAUZE/BANDAGES/DRESSINGS) ×1 IMPLANT
TOWEL OR 17X26 10 PK STRL BLUE (TOWEL DISPOSABLE) ×2 IMPLANT
TOWEL OR NON WOVEN STRL DISP B (DISPOSABLE) ×2 IMPLANT
YANKAUER SUCT BULB TIP 10FT TU (MISCELLANEOUS) ×2 IMPLANT

## 2017-06-27 NOTE — Transfer of Care (Signed)
Immediate Anesthesia Transfer of Care Note  Patient: [name] [name]  Procedure(s) Performed: LEFT THYROID LOBECTOMY (Left Neck)  Patient Location: PACU  Anesthesia Type:General  Level of Consciousness: awake, alert  and oriented  Airway & Oxygen Therapy: Patient Spontanous Breathing and Patient connected to face mask oxygen  Post-op Assessment: Report given to RN and Post -op Vital signs reviewed and stable  Post vital signs: Reviewed and stable  Last Vitals:  Vitals Value Taken Time  BP 158/99 06/27/2017  9:15 AM  Temp    Pulse 64 06/27/2017  9:16 AM  Resp 9 06/27/2017  9:16 AM  SpO2 100 % 06/27/2017  9:16 AM  Vitals shown include unvalidated device data.  Last Pain:  Vitals:   06/27/17 0612  TempSrc:   PainSc: 0-No pain         Complications: No apparent anesthesia complications

## 2017-06-27 NOTE — Interval H&P Note (Signed)
History and Physical Interval Note:  06/27/2017 7:04 AM  [name] [name]  has presented today for surgery, with the diagnosis of hyperthyroidism, thyroid nodule.  The various methods of treatment have been discussed with the patient and family. After consideration of risks, benefits and other options for treatment, the patient has consented to    Procedure(s): LEFT THYROID LOBECTOMY (Left) as a surgical intervention .    The patient's history has been reviewed, patient examined, no change in status, stable for surgery.  I have reviewed the patient's chart and labs.  Questions were answered to the patient's satisfaction.    Armandina Gemma, [facility] Office: [place]

## 2017-06-27 NOTE — Discharge Instructions (Addendum)
[facility]  THYROID & PARATHYROID SURGERY:  POST-OP INSTRUCTIONS  Always review your discharge instruction sheet from the facility where your surgery was performed.  A prescription for pain medication may be given to you upon discharge.  Take your pain medication as prescribed.  If narcotic pain medicine is not needed, then you may take acetaminophen (Tylenol) or ibuprofen (Advil) as needed.  Take your usually prescribed medications unless otherwise directed.  If you need a refill on your pain medication, please contact our office during regular business hours.  Prescriptions cannot be processed by our office after 5 pm or on weekends.  Start with a light diet upon arrival home, such as soup and crackers or toast.  Be sure to drink plenty of fluids daily.  Resume your normal diet the day after surgery.  Most patients will experience some swelling and bruising on the chest and neck area.  Ice packs will help.  Swelling and bruising can take several days to resolve.   It is common to experience some constipation after surgery.  Increasing fluid intake and taking a stool softener (Colace) will usually help or prevent this problem.  A mild laxative (Milk of Magnesia or Miralax) should be taken according to package directions if there has been no bowel movement after 48 hours.  You have steri-strips and a gauze dressing over your incision.  You may remove the gauze bandage on the second day after surgery, and you may shower at that time.  Leave your steri-strips (small skin tapes) in place directly over the incision.  These strips should remain on the skin for 5-7 days and then be removed.  You may get them wet in the shower and pat them dry.  You may resume regular (light) daily activities beginning the next day (such as daily self-care, walking, climbing stairs) gradually increasing activities as tolerated.  You may have sexual intercourse when it is comfortable.  Refrain from  any heavy lifting or straining until approved by your doctor.  You may drive when you no longer are taking prescription pain medication, you can comfortably wear a seatbelt, and you can safely maneuver your car and apply brakes.  You should see your doctor in the office for a follow-up appointment approximately three weeks after your surgery.  Make sure that you call for this appointment within a day or two after you arrive home to insure a convenient appointment time.  WHEN TO CALL YOUR DOCTOR: -- Fever greater than 101.5 -- Inability to urinate -- Nausea and/or vomiting - persistent -- Extreme swelling or bruising -- Continued bleeding from incision -- Increased pain, redness, or drainage from the incision -- Difficulty swallowing or breathing -- Muscle cramping or spasms -- Numbness or tingling in hands or around lips  The clinic staff is available to answer your questions during regular business hours.  Please dont hesitate to call and ask to speak to one of the nurses if you have concerns.  Armandina Gemma, MD [facility] Office: [place] IN 10 DAYS WITHOUT RESTRICTIONS

## 2017-06-27 NOTE — Op Note (Signed)
Procedure Note  Pre-operative Diagnosis:  Left thyroid nodule, hyperthyroidism  Post-operative Diagnosis:  same  Surgeon:  Armandina Gemma, MD  Assistant:  none   Procedure:  Left thyroid lobectomy  Anesthesia:  General  Estimated Blood Loss:  minimal  Drains: none         Specimen: thyroid lobe to pathology  Indications:  Patient is referred by Dr. Vicie Mutters for surgical evaluation and management of newly diagnosed left thyroid nodule and history of hyperthyroidism. Patient was diagnosed with hyperthyroidism and a little over one year ago. Patient was seen by Dr. Jeanann Lewandowsky and started on Tapazole. She had a nuclear medicine scan on February 07, 2016. This showed increased tracer localization within the left thyroid lobe consistent with a hyperfunctioning adenoma. Unfortunately, Dr. Carlis Abbott has retired and the patient has not found subsequent endocrinologic follow-up. She remains on Tapazole 2.5 mg daily. She has not had a recent TSH level. Patient was seen by Dr. Thornell Mule for a suspected parotid mass. She underwent ultrasound and MRI scan again identifying the left thyroid nodule. Thyroid ultrasound from April 15, 2017 shows a solitary nodule in the mid left thyroid lobe measuring 3.4 x 1.8 x 2.0 cm. This was felt to be mildly suspicious and fine-needle aspiration biopsy was recommended. FNA biopsy was benign.  Patient now comes to surgery for left thyroid lobectomy for removal of thyroid nodule and control of hyperthyroidism.  Procedure Details: Procedure was done in OR #1 at the [facility].  The patient was brought to the operating room and placed in a supine position on the operating room table.  Following administration of general anesthesia, the patient was positioned and then prepped and draped in the usual aseptic fashion.  After ascertaining that an adequate level of anesthesia had been achieved, a Kocher incision was made with #15 blade.  Dissection was carried through  subcutaneous tissues and platysma. Hemostasis was achieved with the electrocautery.  Skin flaps were elevated cephalad and caudad from the thyroid notch to the sternal notch.  A self-retaining retractor was placed for exposure.  Strap muscles were incised in the midline and dissection was begun on the left side.  Strap muscles were reflected laterally.  The left thyroid lobe was mildly enlarged with a dominant nodule in the inferior pole.  The lobe was gently mobilized with blunt dissection.  Superior pole vessels were dissected out and divided individually between small and medium Ligaclips with the Harmonic scalpel.  The thyroid lobe was rolled anteriorly.  Branches of the inferior thyroid artery were divided between small Ligaclips with the Harmonic scalpel.  Inferior venous tributaries were divided between Ligaclips.  Both the superior and inferior parathyroid glands were identified and preserved on their vascular pedicles.  The recurrent laryngeal nerve was identified and preserved along its course.  The ligament of Gwenlyn Found was released with the electrocautery and the gland was mobilized onto the anterior trachea. Isthmus was mobilized across the midline.  There was a small pyramidal lobe present which was resected with the thyroid isthmus.  The thyroid parenchyma was transected at the junction of the isthmus and contralateral thyroid lobe with the Harmonic scalpel.  The thyroid lobe and isthmus were submitted to pathology for review.  The neck was irrigated with warm saline.  Fibrillar was placed throughout the operative field.  Strap muscles were reapproximated in the midline with interrupted 3-0 Vicryl sutures.  Platysma was closed with interrupted 3-0 Vicryl sutures.  Skin was closed with a running 4-0 Monocryl  subcuticular suture.  Wound was washed and dried and steri-strips were applied.  Dry gauze dressing was placed.  The patient was awakened from anesthesia and brought to the recovery room.  The  patient tolerated the procedure well.   Armandina Gemma, MD [facility] Office: [phone]

## 2017-06-27 NOTE — Anesthesia Postprocedure Evaluation (Signed)
Anesthesia Post Note  Patient: [name] [name]  Procedure(s) Performed: LEFT THYROID LOBECTOMY (Left Neck)     Patient location during evaluation: PACU Anesthesia Type: General Level of consciousness: awake and alert Pain management: pain level controlled Vital Signs Assessment: post-procedure vital signs reviewed and stable Respiratory status: spontaneous breathing, nonlabored ventilation, respiratory function stable and patient connected to nasal cannula oxygen Cardiovascular status: blood pressure returned to baseline and stable Postop Assessment: no apparent nausea or vomiting Anesthetic complications: no    Last Vitals:  Vitals:   06/27/17 1145 06/27/17 1252  BP: (!) 147/92 (!) 139/93  Pulse: 63 80  Resp: 15 16  Temp: 36.7 C 36.7 C  SpO2: 100% 98%    Last Pain:  Vitals:   06/27/17 1252  TempSrc: Oral  PainSc:                  [name] [name]

## 2017-06-27 NOTE — Anesthesia Procedure Notes (Addendum)
Procedure Name: Intubation Date/Time: 06/27/2017 7:43 AM Performed by: [place] Liliani Bobo C, CRNA Pre-anesthesia Checklist: Patient identified, Suction available, Emergency Drugs available and Patient being monitored Patient Re-evaluated:Patient Re-evaluated prior to induction Oxygen Delivery Method: Circle system utilized Preoxygenation: Pre-oxygenation with 100% oxygen Induction Type: IV induction and Cricoid Pressure applied Ventilation: Oral airway inserted - appropriate to patient size and Mask ventilation without difficulty Laryngoscope Size: Mac and 3 Grade View: Grade I Tube type: Oral Tube size: 7.0 mm Number of attempts: 1 Airway Equipment and Method: Stylet Placement Confirmation: ETT inserted through vocal cords under direct vision,  positive ETCO2,  CO2 detector and breath sounds checked- equal and bilateral Secured at: 22 cm Tube secured with: Tape Dental Injury: Teeth and Oropharynx as per pre-operative assessment

## 2017-06-28 ENCOUNTER — Encounter (HOSPITAL_COMMUNITY): Payer: Self-pay | Admitting: Surgery

## 2017-06-28 DIAGNOSIS — E0520 Thyrotoxicosis with toxic multinodular goiter without thyrotoxic crisis or storm: Secondary | ICD-10-CM | POA: Diagnosis not present

## 2017-06-28 MED ORDER — HYDROCODONE-ACETAMINOPHEN 5-325 MG PO TABS: 1.0000 | ORAL_TABLET | Freq: Four times a day (QID) | ORAL | 0 refills | Status: DC | PRN

## 2017-06-28 NOTE — Plan of Care (Signed)
Pt alert and oriented, states she had a good night and feels much better this am.  Pain well controlled with PO pain meds.  Plan to d./c home per MD order.

## 2017-06-28 NOTE — Progress Notes (Signed)
Discharge paperwork discussed with pt and questions were answered. Pt will call MD office Monday to schedule follow up and obtain a work note.  Pt stable and escorted to front lobby.

## 2017-06-28 NOTE — Discharge Summary (Signed)
Physician Discharge Summary  Patient ID: DEAYSIA GRIGORYAN MRN: [id number] DOB/AGE: 07-May-1965 52 y.o.  Admit date: 06/27/2017 Discharge date: 06/28/2017  Admission Diagnoses:Principal Problem:   Thyroid nodule s/p left thyroid lobectomy 06/26/2017 Active Problems:   Hyperthyroidism  Discharge Diagnoses:  Principal Problem:   Thyroid nodule s/p left thyroid lobectomy 06/26/2017 Active Problems:   Hyperthyroidism   Discharged Condition: good  Hospital Course: Pt did well after thyroid lobectomy .  She had good pain control and tolerated her diet.  She had no problems breathing and was not hoarse.       Treatments: surgery: thyroid lobectomy   Discharge Exam: Blood pressure 109/72, pulse 72, temperature 98.7 F (37.1 C), temperature source Oral, resp. rate 17, height 5\' 3"  (1.6 m), weight 52.6 kg (116 lb), last menstrual period 01/31/2014, SpO2 98 %. General appearance: alert and cooperative Throat: no hematoma  no hoarseness incision CDI no hematoma  Resp: clear to auscultation bilaterally Incision/Wound:CDI  NO HEMATOMA   Disposition: Discharge disposition: 01-Home or Self Care       Discharge Instructions    Diet - low sodium heart healthy   Complete by:  As directed    Increase activity slowly   Complete by:  As directed      Allergies as of 06/28/2017      Reactions   Demerol [meperidine] Itching   Latex Itching, Rash   gloves   Novocain [procaine Hcl] Swelling      Medication List    TAKE these medications   acetaminophen 500 MG tablet Commonly known as:  TYLENOL Take 1,000 mg by mouth 2 (two) times daily as needed for mild pain.   albuterol 108 (90 Base) MCG/ACT inhaler Commonly known as:  PROAIR HFA INHALE TWO PUFFS INTO LUNGS EVERY 4 HOURS AS NEEDED FOR WHEEZING FOR SHORTNESS OF BREATH   aspirin 81 MG EC tablet Take 1 tablet (81 mg total) by mouth daily.   budesonide-formoterol 160-4.5 MCG/ACT inhaler Commonly known as:  SYMBICORT Inhale 2 puffs  into the lungs 2 (two) times daily. What changed:    when to take this  reasons to take this   carvedilol 3.125 MG tablet Commonly known as:  COREG TAKE ONE TABLET BY MOUTH TWICE DAILY WITH MEALS   cholecalciferol 1000 units tablet Commonly known as:  VITAMIN D Take 1,000 Units by mouth daily.   clopidogrel 75 MG tablet Commonly known as:  PLAVIX TAKE ONE TABLET BY MOUTH ONCE DAILY WITH  BREAKFAST   furosemide 20 MG tablet Commonly known as:  LASIX TAKE ONE TABLET BY MOUTH ONCE DAILY AS NEEDED FOR  FLUID  OR  EDEMA   HYDROcodone-acetaminophen 5-325 MG tablet Commonly known as:  NORCO/VICODIN Take 1 tablet by mouth every 6 (six) hours as needed for moderate pain.   loratadine 10 MG tablet Commonly known as:  CLARITIN Take 10 mg by mouth daily.   nitroGLYCERIN 0.4 MG SL tablet Commonly known as:  NITROSTAT DISSOLVE ONE TABLET UNDER THE TONGUE EVERY 5 MINUTES AS NEEDED FOR CHEST PAIN.  DO NOT EXCEED A TOTAL OF 3 DOSES IN 15 MINUTES   potassium chloride 10 MEQ tablet Commonly known as:  K-DUR TAKE ONE TABLET BY MOUTH ONCE DAILY AS NEEDED. ONLY TAKE WHEN TAKING AS NEEDED FUROSEMIDE.   pravastatin 40 MG tablet Commonly known as:  PRAVACHOL TAKE ONE TABLET BY MOUTH ONCE DAILY   traMADol 50 MG tablet Commonly known as:  ULTRAM Take 1-2 tablets (50-100 mg total) by mouth every 6 (  six) hours as needed for moderate pain.      Follow-up Information    Armandina Gemma, MD. Schedule an appointment as soon as possible for a visit in 3 weeks.   Specialty:  General Surgery Why:  For wound re-check Contact information: [place] [id number] [phone]           Signed: Turner Daniels 06/28/2017, 8:34 AM

## 2017-07-22 ENCOUNTER — Other Ambulatory Visit: Payer: Self-pay | Admitting: Cardiology

## 2017-08-19 ENCOUNTER — Other Ambulatory Visit (HOSPITAL_COMMUNITY)
Admission: RE | Admit: 2017-08-19 | Discharge: 2017-08-19 | Disposition: A | Discharge status: Home / Self Care | Payer: BLUE CROSS/BLUE SHIELD | Source: Ambulatory Visit | Attending: Nurse Practitioner | Admitting: Nurse Practitioner

## 2017-08-19 ENCOUNTER — Other Ambulatory Visit: Payer: Self-pay | Admitting: Nurse Practitioner

## 2017-08-19 DIAGNOSIS — Z01419 Encounter for gynecological examination (general) (routine) without abnormal findings: Secondary | ICD-10-CM | POA: Insufficient documentation

## 2017-09-08 LAB — CYTOLOGY - PAP
Diagnosis: NEGATIVE
HPV: NOT DETECTED

## 2017-11-20 NOTE — Progress Notes (Deleted)
Cardiology Office Note   Date:  11/20/2017   ID:  [name], DOB 08-Dec-1965, MRN [id number]  PCP:  [name] Clan, DO  Cardiologist:  ***    No chief complaint on file.     History of Present Illness: [name] [name] is a 52 y.o. female who presents for ***  history ofNSTEMIwithVFnonocclusive intraluminal thrombus managed with heparinin the setting ofcocaineusein 2015, normal LVEF 65%. History of recurrent pericarditis in 2017 and last seen by Dr. Meda Coffee 04/15/16 and was doing well. Patient also has HLD.  I saw the patient 10/2016 at which time she had been complaining of chest pain similar to her pericarditis and colchicine had been called in for her.  She was feeling better.  Plan was to continue colchicine for 3 months.  EKG was normal.  She is scheduled for left thyroid lobectomy 06/27/2017 and has already been cleared from cardiology.  She was told she could hold her Plavix 7 days prior to procedure and resume soon afterward.  Patient was initially scheduled in April but the surgery was canceled and Plavix resumed.   Patient comes in today for routine follow-up.  She still taking the colchicine and does not want to stop it until after her surgery.  She denies any chest pain, dyspnea, dyspnea on exertion, dizziness or presyncope.  She says when she misses a dose of her medications she notices palpitations but is fine if she takes her medication.  She thought the colchicine was helping her palpitations and not the Coreg.   Past Medical History:  Diagnosis Date  . Abnormal Pap smear    Colpo (twice)  . Anemia   . Anxiety and depression 01/01/2014  . Breast disorder    soreness in left breast   . CAD (coronary artery disease)    a. 01/2013 NSTEMI/Cath: LM nl, LAD 26m, LCX nl, RI nl, RCA nl, PDA ostial nonocclusive intraluminal thrombus, EF 65%, Treated with heparin x 48 hrs->Med Rx (occurred in setting of + cocaine);  b. 01/2013 Echo: EF 60-65%, nl wall motion.  .  Chlamydia    years ago  . Depression   . Heart disease    MI Jan 2015  . High cholesterol   . Hypertension    denies   . Hyperthyroidism   . Migraines    occ  . Myocardial infarction ([place])   . Polysubstance abuse ([place])    tobacco/cocaine/marijuana (+ UDS 01/2013).  . Pre-diabetes   . Thyroid disease   . Trichomonas    years ago    Past Surgical History:  Procedure Laterality Date  . BREAST EXCISIONAL BIOPSY Right   . BREAST SURGERY     lump removed from right breast  . CERVICAL CONE BIOPSY    . CESAREAN SECTION     2 c-sections  . ECTOPIC PREGNANCY SURGERY    . ENDOMETRIAL ABLATION W/ NOVASURE    . GROIN MASS OPEN BIOPSY    . LEFT HEART CATHETERIZATION WITH CORONARY ANGIOGRAM N/A 02/08/2013   Procedure: LEFT HEART CATHETERIZATION WITH CORONARY ANGIOGRAM;  Surgeon: Sinclair Grooms, MD;  Location: [facility] CATH LAB;  Service: Cardiovascular;  Laterality: N/A;  . THYROID LOBECTOMY Left 06/27/2017   Procedure: LEFT THYROID LOBECTOMY;  Surgeon: Armandina Gemma, MD;  Location: [facility] ORS;  Service: General;  Laterality: Left;  . TUBAL LIGATION       Current Outpatient Medications  Medication Sig Dispense Refill  . acetaminophen (TYLENOL) 500 MG tablet Take 1,000 mg by  mouth 2 (two) times daily as needed for mild pain.     Marland Kitchen albuterol (PROAIR HFA) 108 (90 Base) MCG/ACT inhaler INHALE TWO PUFFS INTO LUNGS EVERY 4 HOURS AS NEEDED FOR WHEEZING FOR SHORTNESS OF BREATH 9 each 0  . aspirin EC 81 MG EC tablet Take 1 tablet (81 mg total) by mouth daily.    . budesonide-formoterol (SYMBICORT) 160-4.5 MCG/ACT inhaler Inhale 2 puffs into the lungs 2 (two) times daily. (Patient taking differently: Inhale 2 puffs into the lungs daily as needed (shortness of breath). ) 1 Inhaler 1  . carvedilol (COREG) 3.125 MG tablet TAKE ONE TABLET BY MOUTH TWICE DAILY WITH MEALS 180 tablet 2  . cholecalciferol (VITAMIN D) 1000 units tablet Take 1,000 Units by mouth daily.    . clopidogrel (PLAVIX) 75 MG tablet TAKE  ONE TABLET BY MOUTH ONCE DAILY WITH  BREAKFAST 90 tablet 2  . furosemide (LASIX) 20 MG tablet TAKE ONE TABLET BY MOUTH ONCE DAILY AS NEEDED FOR  FLUID  OR  EDEMA 90 tablet 1  . HYDROcodone-acetaminophen (NORCO/VICODIN) 5-325 MG tablet Take 1 tablet by mouth every 6 (six) hours as needed for moderate pain. 10 tablet 0  . loratadine (CLARITIN) 10 MG tablet Take 10 mg by mouth daily.    . nitroGLYCERIN (NITROSTAT) 0.4 MG SL tablet DISSOLVE ONE TABLET UNDER THE TONGUE EVERY 5 MINUTES AS NEEDED FOR CHEST PAIN.  DO NOT EXCEED A TOTAL OF 3 DOSES IN 15 MINUTES 25 tablet 6  . nitroGLYCERIN (NITROSTAT) 0.4 MG SL tablet PLACE ONE TABLET UNDER THE TONGUE EVERY 5 MINUTES FOR 3 DOSES AS NEEDED FOR CHEST PAIN 25 tablet 11  . potassium chloride (K-DUR) 10 MEQ tablet TAKE ONE TABLET BY MOUTH ONCE DAILY AS NEEDED. ONLY TAKE WHEN TAKING AS NEEDED FUROSEMIDE. 30 tablet 3  . pravastatin (PRAVACHOL) 40 MG tablet TAKE ONE TABLET BY MOUTH ONCE DAILY 90 tablet 2  . traMADol (ULTRAM) 50 MG tablet Take 1-2 tablets (50-100 mg total) by mouth every 6 (six) hours as needed for moderate pain. 24 tablet 0   No current facility-administered medications for this visit.     Allergies:   Demerol [meperidine]; Latex; and Novocain [procaine hcl]    Social History:  The patient  reports that she quit smoking about 4 years ago. Her smoking use included cigarettes. She has a 15.50 pack-year smoking history. She has never used smokeless tobacco. She reports that she drinks alcohol. She reports that she has current or past drug history. Drug: Marijuana. Frequency: 2.00 times per week.   Family History:  The patient's ***family history includes Breast cancer in her mother; Hypertension in her brother and sister; Kidney failure in her brother.    ROS:  General:no colds or fevers, no weight changes Skin:no rashes or ulcers HEENT:no blurred vision, no congestion CV:see HPI PUL:see HPI GI:no diarrhea constipation or melena, no  indigestion GU:no hematuria, no dysuria MS:no joint pain, no claudication Neuro:no syncope, no lightheadedness Endo:no diabetes, no thyroid disease Wt Readings from Last 3 Encounters:  06/27/17 116 lb (52.6 kg)  06/24/17 116 lb (52.6 kg)  06/20/17 118 lb (53.5 kg)     PHYSICAL EXAM: VS:  LMP 01/31/2014  , BMI There is no height or weight on file to calculate BMI. General:Pleasant affect, NAD Skin:Warm and dry, brisk capillary refill HEENT:normocephalic, sclera clear, mucus membranes moist Neck:supple, no JVD, no bruits  Heart:S1S2 RRR without murmur, gallup, rub or click Lungs:clear without rales, rhonchi, or wheezes DSK:AJGO, non tender, +  BS, do not palpate liver spleen or masses Ext:no lower ext edema, 2+ pedal pulses, 2+ radial pulses Neuro:alert and oriented, MAE, follows commands, + facial symmetry    EKG:  EKG is ordered today. The ekg ordered today demonstrates ***   Recent Labs: 06/20/2017: BUN 15; Creatinine, Ser 1.05; Hemoglobin 13.4; Platelets 200; Potassium 3.9; Sodium 139    Lipid Panel    Component Value Date/Time   CHOL 174 01/12/2016 1018   CHOL 173 03/16/2015 1139   TRIG 81 01/12/2016 1018   TRIG 99 03/16/2015 1139   HDL 45 (L) 01/12/2016 1018   HDL 57 03/16/2015 1139   CHOLHDL 3.9 01/12/2016 1018   VLDL 16 01/12/2016 1018   LDLCALC 113 (H) 01/12/2016 1018   LDLCALC 96 03/16/2015 1139       Other studies Reviewed: Additional studies/ records that were reviewed today include: ***.   ASSESSMENT AND PLAN:  1.  ***   Current medicines are reviewed with the patient today.  The patient Has no concerns regarding medicines.  The following changes have been made:  See above Labs/ tests ordered today include:see above  Disposition:   FU:  see above  Signed, Cecilie Kicks, NP  11/20/2017 6:55 PM    [address] [place], [place], [place], [place] Phone: [phone]; Fax: [phone]

## 2017-11-21 ENCOUNTER — Ambulatory Visit: Payer: BLUE CROSS/BLUE SHIELD | Admitting: Cardiology

## 2017-12-09 ENCOUNTER — Other Ambulatory Visit: Payer: Self-pay | Admitting: Cardiology

## 2017-12-09 DIAGNOSIS — I309 Acute pericarditis, unspecified: Secondary | ICD-10-CM

## 2017-12-09 DIAGNOSIS — R002 Palpitations: Secondary | ICD-10-CM

## 2017-12-09 DIAGNOSIS — I10 Essential (primary) hypertension: Secondary | ICD-10-CM

## 2018-03-26 ENCOUNTER — Other Ambulatory Visit: Payer: Self-pay | Admitting: Cardiology

## 2018-03-26 DIAGNOSIS — I10 Essential (primary) hypertension: Secondary | ICD-10-CM

## 2018-03-26 DIAGNOSIS — R002 Palpitations: Secondary | ICD-10-CM

## 2018-03-26 DIAGNOSIS — I309 Acute pericarditis, unspecified: Secondary | ICD-10-CM

## 2018-04-21 ENCOUNTER — Telehealth: Payer: Self-pay | Admitting: Emergency Medicine

## 2018-04-21 NOTE — Telephone Encounter (Signed)
Instructions   Please start and complete amoxicillin as planned Continue Symbicort 2 puffs twice a day Follow with Dr Lamonte Sakai next available to review your symptoms after the antibiotics. Depending on whether you're still seeing blood in your mucus we may decide to do other studies.     _______________________  Hulen Skains and spoke with patient, patient has not been seen since last OV in 2017. Patient is requesting refills of Albuterol and Smybicort. RB please advise, thank you.

## 2018-04-21 NOTE — Telephone Encounter (Signed)
Ok to refill, but please have her make an OV in 3 months to re-establish care

## 2018-04-22 MED ORDER — BUDESONIDE-FORMOTEROL FUMARATE 160-4.5 MCG/ACT IN AERO: 2.0000 | INHALATION_SPRAY | Freq: Two times a day (BID) | RESPIRATORY_TRACT | 2 refills | Status: DC

## 2018-04-22 MED ORDER — ALBUTEROL SULFATE HFA 108 (90 BASE) MCG/ACT IN AERS: INHALATION_SPRAY | RESPIRATORY_TRACT | 2 refills | Status: AC

## 2018-04-22 NOTE — Telephone Encounter (Signed)
Called patient and made aware refills will be sent. Per Dr. Lamonte Sakai she needs to make appt in next 3 mos. No future refills. Patient verbalized understanding.

## 2018-11-17 ENCOUNTER — Other Ambulatory Visit: Payer: Self-pay | Admitting: Cardiology

## 2018-11-17 DIAGNOSIS — I10 Essential (primary) hypertension: Secondary | ICD-10-CM

## 2018-11-17 DIAGNOSIS — I309 Acute pericarditis, unspecified: Secondary | ICD-10-CM

## 2018-11-17 DIAGNOSIS — R002 Palpitations: Secondary | ICD-10-CM

## 2019-09-29 ENCOUNTER — Encounter: Payer: Self-pay | Admitting: Cardiology

## 2021-10-07 NOTE — Progress Notes (Deleted)
New Patient Office Visit  Subjective:  Patient ID: [name] [name], female    DOB: Mar 29, 1965  Age: 56 y.o. MRN: [id number]  CC: No chief complaint on file.   HPI [name] [name] presents for establishing care today.  Assessment & Plan:   Problem List Items Addressed This Visit   None   Subjective:    Outpatient Medications Prior to Visit  Medication Sig Dispense Refill  . acetaminophen (TYLENOL) 500 MG tablet Take 1,000 mg by mouth 2 (two) times daily as needed for mild pain.     Marland Kitchen albuterol (PROAIR HFA) 108 (90 Base) MCG/ACT inhaler INHALE TWO PUFFS INTO LUNGS EVERY 4 HOURS AS NEEDED FOR WHEEZING FOR SHORTNESS OF BREATH 18 g 2  . aspirin EC 81 MG EC tablet Take 1 tablet (81 mg total) by mouth daily.    . budesonide-formoterol (SYMBICORT) 160-4.5 MCG/ACT inhaler Inhale 2 puffs into the lungs 2 (two) times daily. 1 Inhaler 2  . carvedilol (COREG) 3.125 MG tablet Take 1 tablet (3.125 mg total) by mouth 2 (two) times daily with a meal. Please make overdue appt with Dr. Meda Coffee before anymore refills. 1st attempt 60 tablet 0  . cholecalciferol (VITAMIN D) 1000 units tablet Take 1,000 Units by mouth daily.    . clopidogrel (PLAVIX) 75 MG tablet Take 1 tablet (75 mg total) by mouth daily. 30 tablet 5  . furosemide (LASIX) 20 MG tablet TAKE ONE TABLET BY MOUTH ONCE DAILY AS NEEDED FOR  FLUID  OR  EDEMA 90 tablet 1  . HYDROcodone-acetaminophen (NORCO/VICODIN) 5-325 MG tablet Take 1 tablet by mouth every 6 (six) hours as needed for moderate pain. 10 tablet 0  . loratadine (CLARITIN) 10 MG tablet Take 10 mg by mouth daily.    . nitroGLYCERIN (NITROSTAT) 0.4 MG SL tablet DISSOLVE ONE TABLET UNDER THE TONGUE EVERY 5 MINUTES AS NEEDED FOR CHEST PAIN.  DO NOT EXCEED A TOTAL OF 3 DOSES IN 15 MINUTES 25 tablet 6  . nitroGLYCERIN (NITROSTAT) 0.4 MG SL tablet PLACE ONE TABLET UNDER THE TONGUE EVERY 5 MINUTES FOR 3 DOSES AS NEEDED FOR CHEST PAIN 25 tablet 11  . potassium chloride (K-DUR) 10 MEQ tablet  TAKE ONE TABLET BY MOUTH ONCE DAILY AS NEEDED. ONLY TAKE WHEN TAKING AS NEEDED FUROSEMIDE. 30 tablet 3  . pravastatin (PRAVACHOL) 40 MG tablet Take 1 tablet (40 mg total) by mouth daily. Please make overdue appt with Dr. Meda Coffee before anymore refills. 1st attempt 30 tablet 0  . traMADol (ULTRAM) 50 MG tablet Take 1-2 tablets (50-100 mg total) by mouth every 6 (six) hours as needed for moderate pain. 24 tablet 0   No facility-administered medications prior to visit.   Past Medical History:  Diagnosis Date  . Abnormal Pap smear    Colpo (twice)  . Anemia   . Anxiety and depression 01/01/2014  . Breast disorder    soreness in left breast   . CAD (coronary artery disease)    a. 01/2013 NSTEMI/Cath: LM nl, LAD 22m LCX nl, RI nl, RCA nl, PDA ostial nonocclusive intraluminal thrombus, EF 65%, Treated with heparin x 48 hrs->Med Rx (occurred in setting of + cocaine);  b. 01/2013 Echo: EF 60-65%, nl wall motion.  . Chlamydia    years ago  . Depression   . Heart disease    MI Jan 2015  . High cholesterol   . Hypertension    denies   . Hyperthyroidism   . Migraines    occ  .  Myocardial infarction ([place])   . Polysubstance abuse ([place])    tobacco/cocaine/marijuana (+ UDS 01/2013).  . Pre-diabetes   . Thyroid disease   . Trichomonas    years ago   Past Surgical History:  Procedure Laterality Date  . BREAST EXCISIONAL BIOPSY Right   . BREAST SURGERY     lump removed from right breast  . CERVICAL CONE BIOPSY    . CESAREAN SECTION     2 c-sections  . ECTOPIC PREGNANCY SURGERY    . ENDOMETRIAL ABLATION W/ NOVASURE    . GROIN MASS OPEN BIOPSY    . LEFT HEART CATHETERIZATION WITH CORONARY ANGIOGRAM N/A 02/08/2013   Procedure: LEFT HEART CATHETERIZATION WITH CORONARY ANGIOGRAM;  Surgeon: Sinclair Grooms, MD;  Location: [facility] CATH LAB;  Service: Cardiovascular;  Laterality: N/A;  . THYROID LOBECTOMY Left 06/27/2017   Procedure: LEFT THYROID LOBECTOMY;  Surgeon: Armandina Gemma, MD;  Location: [facility] ORS;   Service: General;  Laterality: Left;  . TUBAL LIGATION      Objective:   Today's Vitals: LMP 01/31/2014   Physical Exam Vitals and nursing note reviewed.  Constitutional:      Appearance: Normal appearance.  Cardiovascular:     Rate and Rhythm: Normal rate and regular rhythm.  Pulmonary:     Effort: Pulmonary effort is normal.     Breath sounds: Normal breath sounds.  Musculoskeletal:        General: Normal range of motion.  Skin:    General: Skin is warm and dry.  Neurological:     Mental Status: She is alert.  Psychiatric:        Mood and Affect: Mood normal.        Behavior: Behavior normal.   No orders of the defined types were placed in this encounter.   Jeanie Sewer, NP

## 2021-10-08 ENCOUNTER — Ambulatory Visit: Payer: Medicaid Other | Admitting: Family

## 2021-10-30 ENCOUNTER — Ambulatory Visit: Payer: Commercial Managed Care - HMO | Admitting: Family

## 2021-11-13 ENCOUNTER — Ambulatory Visit: Payer: Commercial Managed Care - HMO | Admitting: Physician Assistant

## 2023-10-13 ENCOUNTER — Other Ambulatory Visit: Payer: Self-pay

## 2023-10-13 ENCOUNTER — Emergency Department (HOSPITAL_COMMUNITY)
Admission: EM | Admit: 2023-10-13 | Discharge: 2023-10-14 | Disposition: A | Discharge status: Home / Self Care | Attending: Emergency Medicine | Admitting: Emergency Medicine

## 2023-10-13 ENCOUNTER — Encounter (HOSPITAL_COMMUNITY): Payer: Self-pay

## 2023-10-13 ENCOUNTER — Emergency Department (HOSPITAL_COMMUNITY)

## 2023-10-13 DIAGNOSIS — R1030 Lower abdominal pain, unspecified: Secondary | ICD-10-CM

## 2023-10-13 DIAGNOSIS — I2510 Atherosclerotic heart disease of native coronary artery without angina pectoris: Secondary | ICD-10-CM | POA: Diagnosis not present

## 2023-10-13 DIAGNOSIS — R112 Nausea with vomiting, unspecified: Secondary | ICD-10-CM | POA: Insufficient documentation

## 2023-10-13 DIAGNOSIS — Z7982 Long term (current) use of aspirin: Secondary | ICD-10-CM | POA: Diagnosis not present

## 2023-10-13 DIAGNOSIS — R1031 Right lower quadrant pain: Secondary | ICD-10-CM | POA: Insufficient documentation

## 2023-10-13 DIAGNOSIS — R1032 Left lower quadrant pain: Secondary | ICD-10-CM | POA: Insufficient documentation

## 2023-10-13 DIAGNOSIS — I10 Essential (primary) hypertension: Secondary | ICD-10-CM | POA: Diagnosis not present

## 2023-10-13 DIAGNOSIS — R509 Fever, unspecified: Secondary | ICD-10-CM | POA: Diagnosis not present

## 2023-10-13 DIAGNOSIS — Z79899 Other long term (current) drug therapy: Secondary | ICD-10-CM | POA: Diagnosis not present

## 2023-10-13 DIAGNOSIS — J449 Chronic obstructive pulmonary disease, unspecified: Secondary | ICD-10-CM | POA: Diagnosis not present

## 2023-10-13 DIAGNOSIS — Z7902 Long term (current) use of antithrombotics/antiplatelets: Secondary | ICD-10-CM | POA: Diagnosis not present

## 2023-10-13 DIAGNOSIS — Z91040 Latex allergy status: Secondary | ICD-10-CM | POA: Insufficient documentation

## 2023-10-13 LAB — COMPREHENSIVE METABOLIC PANEL WITH GFR
ALT: 8 U/L (ref 0–44)
AST: 15 U/L (ref 15–41)
Albumin: 4.2 g/dL (ref 3.5–5.0)
Alkaline Phosphatase: 76 U/L (ref 38–126)
Anion gap: 14 (ref 5–15)
BUN: 8 mg/dL (ref 6–20)
CO2: 25 mmol/L (ref 22–32)
Calcium: 9.6 mg/dL (ref 8.9–10.3)
Chloride: 102 mmol/L (ref 98–111)
Creatinine, Ser: 0.8 mg/dL (ref 0.44–1.00)
GFR, Estimated: >60 mL/min (ref >60)
Glucose, Bld: 147 mg/dL — ABNORMAL HIGH (ref 70–99)
Potassium: 3.4 mmol/L — ABNORMAL LOW (ref 3.5–5.1)
Sodium: 141 mmol/L (ref 135–145)
Total Bilirubin: 0.7 mg/dL (ref 0.0–1.2)
Total Protein: 8 g/dL (ref 6.5–8.1)

## 2023-10-13 LAB — TSH: TSH: 0.51 u[IU]/mL (ref 0.350–4.500)

## 2023-10-13 LAB — CBC
HCT: 46.4 % — ABNORMAL HIGH (ref 36.0–46.0)
Hemoglobin: 14.5 g/dL (ref 12.0–15.0)
MCH: 27.6 pg (ref 26.0–34.0)
MCHC: 31.3 g/dL (ref 30.0–36.0)
MCV: 88.2 fL (ref 80.0–100.0)
Platelets: 285 K/uL (ref 150–400)
RBC: 5.26 MIL/uL — ABNORMAL HIGH (ref 3.87–5.11)
RDW: 13.4 % (ref 11.5–15.5)
WBC: 13.6 K/uL — ABNORMAL HIGH (ref 4.0–10.5)
nRBC: 0 % (ref 0.0–0.2)

## 2023-10-13 LAB — LIPASE, BLOOD: Lipase: 82 U/L — ABNORMAL HIGH (ref 11–51)

## 2023-10-13 MED ORDER — DICYCLOMINE HCL 20 MG PO TABS: 20.0000 mg | ORAL_TABLET | Freq: Three times a day (TID) | ORAL | 0 refills | Status: DC | PRN

## 2023-10-13 MED ORDER — IOHEXOL 300 MG/ML  SOLN
100.0000 mL | Freq: Once | INTRAMUSCULAR | Status: AC | PRN
Start: 2023-10-13 — End: 2023-10-13
  Administered 2023-10-13: 100 mL via INTRAVENOUS

## 2023-10-13 MED ORDER — MORPHINE SULFATE (PF) 4 MG/ML IV SOLN
4.0000 mg | Freq: Once | INTRAVENOUS | Status: AC
  Administered 2023-10-13: 4 mg via INTRAVENOUS
  Filled 2023-10-13: qty 1

## 2023-10-13 MED ORDER — ONDANSETRON 4 MG PO TBDP: 4.0000 mg | ORAL_TABLET | Freq: Three times a day (TID) | ORAL | 0 refills | Status: DC | PRN

## 2023-10-13 MED ORDER — SODIUM CHLORIDE 0.9 % IV BOLUS
1000.0000 mL | Freq: Once | INTRAVENOUS | Status: AC
  Administered 2023-10-13: 1000 mL via INTRAVENOUS

## 2023-10-13 MED ORDER — ONDANSETRON HCL 4 MG/2ML IJ SOLN
4.0000 mg | Freq: Once | INTRAMUSCULAR | Status: AC
  Administered 2023-10-13: 4 mg via INTRAVENOUS
  Filled 2023-10-13: qty 2

## 2023-10-13 NOTE — ED Provider Notes (Signed)
 [facility] EMERGENCY DEPARTMENT AT [facility] Provider Note   CSN: [id number] Arrival date & time: 10/13/23  1812     Patient presents with: Abdominal Pain   [name] [name] is a 58 y.o. female.   Pt is a 58 yo female with pmhx significant for diverticulitis, HTN, COPD, CAD, polysubstance abuse, and hyperthyroidism.  Pt developed lower abd pain with n/v/d yesterday.  Pt said this feels like prior episode of diverticulitis, but worse.       Prior to Admission medications   Medication Sig Start Date End Date Taking? Authorizing Provider  dicyclomine  (BENTYL ) 20 MG tablet Take 1 tablet (20 mg total) by mouth 3 (three) times daily as needed (abdominal spasms). 10/13/23  Yes Dean Clarity, MD  ondansetron  (ZOFRAN -ODT) 4 MG disintegrating tablet Take 1 tablet (4 mg total) by mouth every 8 (eight) hours as needed. 10/13/23  Yes Dean Clarity, MD  acetaminophen  (TYLENOL ) 500 MG tablet Take 1,000 mg by mouth 2 (two) times daily as needed for mild pain.     [provider]  albuterol  (PROAIR  HFA) 108 (90 Base) MCG/ACT inhaler INHALE TWO PUFFS INTO LUNGS EVERY 4 HOURS AS NEEDED FOR WHEEZING FOR SHORTNESS OF BREATH 04/22/18   Shelah Lamar RAMAN, MD  aspirin  EC 81 MG EC tablet Take 1 tablet (81 mg total) by mouth daily. 02/12/13   Vivienne Lonni Ingle, NP  budesonide -formoterol  (SYMBICORT ) 160-4.5 MCG/ACT inhaler Inhale 2 puffs into the lungs 2 (two) times daily. 04/22/18   Byrum, Robert S, MD  carvedilol  (COREG ) 3.125 MG tablet Take 1 tablet (3.125 mg total) by mouth 2 (two) times daily with a meal. Please make overdue appt with Dr. Maranda before anymore refills. 1st attempt 11/18/18   Maranda Leim DEL, MD  cholecalciferol (VITAMIN D ) 1000 units tablet Take 1,000 Units by mouth daily.    [provider]  clopidogrel  (PLAVIX ) 75 MG tablet Take 1 tablet (75 mg total) by mouth daily. 12/09/17   Maranda Leim DEL, MD  furosemide  (LASIX ) 20 MG tablet TAKE ONE TABLET BY  MOUTH ONCE DAILY AS NEEDED FOR  FLUID  OR  EDEMA 02/25/17   Maranda Leim DEL, MD  HYDROcodone -acetaminophen  (NORCO/VICODIN) 5-325 MG tablet Take 1 tablet by mouth every 6 (six) hours as needed for moderate pain. 06/28/17   Cornett, Debby, MD  loratadine  (CLARITIN ) 10 MG tablet Take 10 mg by mouth daily.    [provider]  nitroGLYCERIN  (NITROSTAT ) 0.4 MG SL tablet DISSOLVE ONE TABLET UNDER THE TONGUE EVERY 5 MINUTES AS NEEDED FOR CHEST PAIN.  DO NOT EXCEED A TOTAL OF 3 DOSES IN 15 MINUTES 11/03/15   Maranda Leim DEL, MD  nitroGLYCERIN  (NITROSTAT ) 0.4 MG SL tablet PLACE ONE TABLET UNDER THE TONGUE EVERY 5 MINUTES FOR 3 DOSES AS NEEDED FOR CHEST PAIN 07/22/17   Maranda Leim DEL, MD  potassium chloride  (K-DUR) 10 MEQ tablet TAKE ONE TABLET BY MOUTH ONCE DAILY AS NEEDED. ONLY TAKE WHEN TAKING AS NEEDED FUROSEMIDE . 02/25/17   Maranda Leim DEL, MD  pravastatin  (PRAVACHOL ) 40 MG tablet Take 1 tablet (40 mg total) by mouth daily. Please make overdue appt with Dr. Maranda before anymore refills. 1st attempt 11/18/18   Maranda Leim DEL, MD  traMADol  (ULTRAM ) 50 MG tablet Take 1-2 tablets (50-100 mg total) by mouth every 6 (six) hours as needed for moderate pain. 06/27/17   Eletha Boas, MD    Allergies: Demerol  [meperidine ], Latex, and Novocain [procaine hcl]    Review of Systems  Gastrointestinal:  Positive for abdominal pain, diarrhea, nausea and vomiting.  All other systems reviewed and are negative.   Updated Vital Signs BP 114/67   Pulse 100   Temp 98.2 F (36.8 C) (Oral)   Resp 17   LMP 01/31/2014   SpO2 98%   Physical Exam Vitals and nursing note reviewed.  Constitutional:      Appearance: She is well-developed.  HENT:     Head: Normocephalic and atraumatic.     Mouth/Throat:     Mouth: Mucous membranes are moist.     Pharynx: Oropharynx is clear.  Eyes:     Extraocular Movements: Extraocular movements intact.     Pupils: Pupils are equal, round, and reactive to light.   Cardiovascular:     Rate and Rhythm: Normal rate and regular rhythm.     Heart sounds: Normal heart sounds.  Pulmonary:     Effort: Pulmonary effort is normal.     Breath sounds: Normal breath sounds.  Abdominal:     General: Abdomen is flat. Bowel sounds are normal.     Palpations: Abdomen is soft.     Tenderness: There is abdominal tenderness in the right lower quadrant, suprapubic area and left lower quadrant.  Skin:    General: Skin is warm.     Capillary Refill: Capillary refill takes less than 2 seconds.  Neurological:     General: No focal deficit present.     Mental Status: She is alert and oriented to person, place, and time.  Psychiatric:        Mood and Affect: Mood normal.        Behavior: Behavior normal.     (all labs ordered are listed, but only abnormal results are displayed) Labs Reviewed  LIPASE, BLOOD - Abnormal; Notable for the following components:      Result Value   Lipase 82 (*)    All other components within normal limits  COMPREHENSIVE METABOLIC PANEL WITH GFR - Abnormal; Notable for the following components:   Potassium 3.4 (*)    Glucose, Bld 147 (*)    All other components within normal limits  CBC - Abnormal; Notable for the following components:   WBC 13.6 (*)    RBC 5.26 (*)    HCT 46.4 (*)    All other components within normal limits  TSH  URINALYSIS, ROUTINE W REFLEX MICROSCOPIC    EKG: None  Radiology: CT ABDOMEN PELVIS W CONTRAST Result Date: 10/13/2023 CLINICAL DATA:  LLQ abdominal pain EXAM: CT ABDOMEN AND PELVIS WITH CONTRAST TECHNIQUE: Multidetector CT imaging of the abdomen and pelvis was performed using the standard protocol following bolus administration of intravenous contrast. RADIATION DOSE REDUCTION: This exam was performed according to the departmental dose-optimization program which includes automated exposure control, adjustment of the mA and/or kV according to patient size and/or use of iterative reconstruction  technique. CONTRAST:  100mL OMNIPAQUE  IOHEXOL  300 MG/ML  SOLN COMPARISON:  None Available. FINDINGS: Lower chest: Centrilobular emphysematous changes. 4 mm left lower lobe pulmonary nodule. Hepatobiliary: No focal liver abnormality. No gallstones, gallbladder wall thickening, or pericholecystic fluid. No biliary dilatation. Pancreas: No focal lesion. Normal pancreatic contour. No surrounding inflammatory changes. No main pancreatic ductal dilatation. Spleen: Normal in size without focal abnormality. Adrenals/Urinary Tract: No adrenal nodule bilaterally. Bilateral kidneys enhance symmetrically. No hydronephrosis. No hydroureter. The urinary bladder is unremarkable. On delayed imaging, there is no urothelial wall thickening and there are no filling defects in the opacified portions of the bilateral collecting systems or  ureters. Stomach/Bowel: Stomach is within normal limits. No evidence of bowel wall thickening or dilatation. Colonic diverticulosis. The appendix is not definitely identified with no inflammatory changes in the right lower quadrant to suggest acute appendicitis. Vascular/Lymphatic: No abdominal aorta or iliac aneurysm. Mild atherosclerotic plaque of the aorta and its branches. No abdominal, pelvic, or inguinal lymphadenopathy. Reproductive: Anterior uterine wall fibroid . Otherwise uterus and bilateral adnexa are unremarkable. Other: No intraperitoneal free fluid. No intraperitoneal free gas. No organized fluid collection. Musculoskeletal: No abdominal wall hernia or abnormality. No suspicious lytic or blastic osseous lesions. No acute displaced fracture. IMPRESSION: 1. Colonic diverticulosis with no acute diverticulitis. 2. Intramural uterine fibroid. 3.  Aortic Atherosclerosis (ICD10-I70.0). Electronically Signed   By: Morgane  Naveau M.D.   On: 10/13/2023 22:56     Procedures   Medications Ordered in the ED  morphine  (PF) 4 MG/ML injection 4 mg (4 mg Intravenous Given 10/13/23 2128)   ondansetron  (ZOFRAN ) injection 4 mg (4 mg Intravenous Given 10/13/23 2127)  sodium chloride  0.9 % bolus 1,000 mL (1,000 mLs Intravenous New Bag/Given 10/13/23 2128)  iohexol  (OMNIPAQUE ) 300 MG/ML solution 100 mL (100 mLs Intravenous Contrast Given 10/13/23 2230)                                    Medical Decision Making Amount and/or Complexity of Data Reviewed Labs: ordered. Radiology: ordered.  Risk Prescription drug management.   This patient presents to the ED for concern of abd pain, this involves an extensive number of treatment options, and is a complaint that carries with it a high risk of complications and morbidity.  The differential diagnosis includes kidney stone, diverticulitis, uti, pyelo, appy   Co morbidities that complicate the patient evaluation  diverticulitis, HTN, COPD, CAD, polysubstance abuse, and hyperthyroidism   Additional history obtained:  Additional history obtained from epic chart review External records from outside source obtained and reviewed including family   Lab Tests:  I Ordered, and personally interpreted labs.  The pertinent results include:  cbc with wbc 13.6; cmp nl; lip sl elevated at 82   Imaging Studies ordered:  I ordered imaging studies including ct abd/pelvis  I independently visualized and interpreted imaging which showed  Colonic diverticulosis with no acute diverticulitis.  2. Intramural uterine fibroid.  3.  Aortic Atherosclerosis (ICD10-I70.0).   I agree with the radiologist interpretation   Cardiac Monitoring:  The patient was maintained on a cardiac monitor.  I personally viewed and interpreted the cardiac monitored which showed an underlying rhythm of: st initially, now nsr   Medicines ordered and prescription drug management:  I ordered medication including ivfs/morphine /zofran   for sx  Reevaluation of the patient after these medicines showed that the patient improved I have reviewed the patients home  medicines and have made adjustments as needed   Test Considered:  ct  Problem List / ED Course:  Abd pain and n/v:  pt is feeling better after meds and fluids.  Ct neg.  UA pending.   Reevaluation:  After the interventions noted above, I reevaluated the patient and found that they have :improved   Social Determinants of Health:  Lives at home   Dispostion:  Pending at shift change     Final diagnoses:  Lower abdominal pain  Nausea and vomiting, unspecified vomiting type    ED Discharge Orders          Ordered  dicyclomine  (BENTYL ) 20 MG tablet  3 times daily PRN        10/13/23 2316    ondansetron  (ZOFRAN -ODT) 4 MG disintegrating tablet  Every 8 hours PRN        10/13/23 2316               Honore Wipperfurth, MD 10/13/23 2317

## 2023-10-13 NOTE — ED Notes (Signed)
Pt is aware of need for urine specimen.

## 2023-10-13 NOTE — ED Triage Notes (Signed)
 Pt presents to ED from home C/O abd pain, n/v/d X 1 day. Hx diverticulitis.

## 2023-10-13 NOTE — ED Notes (Signed)
 Unsuccessful IV attempt x2.

## 2023-10-14 LAB — URINALYSIS, ROUTINE W REFLEX MICROSCOPIC
Bacteria, UA: NONE SEEN
Bilirubin Urine: NEGATIVE
Glucose, UA: NEGATIVE mg/dL
Ketones, ur: NEGATIVE mg/dL
Leukocytes,Ua: NEGATIVE
Nitrite: NEGATIVE
Protein, ur: NEGATIVE mg/dL
Specific Gravity, Urine: >1.046 — ABNORMAL HIGH (ref 1.005–1.030)
pH: 6 (ref 5.0–8.0)

## 2023-10-14 LAB — RESP PANEL BY RT-PCR (RSV, FLU A&B, COVID)  RVPGX2
Influenza A by PCR: NEGATIVE
Influenza B by PCR: NEGATIVE
Resp Syncytial Virus by PCR: NEGATIVE
SARS Coronavirus 2 by RT PCR: NEGATIVE

## 2023-10-14 MED ORDER — HYOSCYAMINE SULFATE 0.125 MG SL SUBL
0.2500 mg | SUBLINGUAL_TABLET | Freq: Once | SUBLINGUAL | Status: AC
  Administered 2023-10-14: 0.25 mg via ORAL
  Filled 2023-10-14: qty 2

## 2023-10-14 MED ORDER — ACETAMINOPHEN 325 MG PO TABS
650.0000 mg | ORAL_TABLET | Freq: Once | ORAL | Status: AC
Start: 2023-10-14 — End: 2023-10-14
  Administered 2023-10-14: 650 mg via ORAL
  Filled 2023-10-14: qty 2

## 2023-10-14 MED ORDER — ONDANSETRON 4 MG PO TBDP: 4.0000 mg | ORAL_TABLET | Freq: Three times a day (TID) | ORAL | 0 refills | Status: DC | PRN

## 2023-10-14 MED ORDER — DICYCLOMINE HCL 20 MG PO TABS: 20.0000 mg | ORAL_TABLET | Freq: Three times a day (TID) | ORAL | 0 refills | Status: DC | PRN

## 2023-10-14 NOTE — ED Provider Notes (Addendum)
 I assumed care of this patient from previous provider.  Please see their note for further details of history, exam, and MDM.   Briefly patient is a 58 y.o. female who presented lower abd pain.  Workup thus far is reassuring with a negative CT scan for any serious intra-abdominal inflammatory/infectious process including diverticulitis.  Patient is currently awaiting UA to rule out UTI.  UA negative. Noted to have fever of 101. Given tylenol . No obvious source during work up. Will get resp viral panel to assess for COVID/Flu/RSV. Given GI sx, viral gastroenteritis also likely.  The patient appears reasonably screened and/or stabilized for discharge and I doubt any other medical condition or other [facility] requiring further screening, evaluation, or treatment in the ED at this time. I have discussed the findings, Dx and Tx plan with the patient/family who expressed understanding and agree(s) with the plan. Discharge instructions discussed at length. The patient/family was given strict return precautions who verbalized understanding of the instructions. No further questions at time of discharge.  Disposition: Discharge  Condition: Good  ED Discharge Orders          Ordered    dicyclomine  (BENTYL ) 20 MG tablet  3 times daily PRN        10/13/23 2316    ondansetron  (ZOFRAN -ODT) 4 MG disintegrating tablet  Every 8 hours PRN        10/13/23 2316             Follow Up: [facility] Emergency Department at [facility] [address] [place]  [address] [phone]  If symptoms worsen           Ambert Virrueta, Raynell Moder, MD 10/14/23 [phone]

## 2023-10-15 ENCOUNTER — Observation Stay (HOSPITAL_COMMUNITY)
Admission: EM | Admit: 2023-10-15 | Discharge: 2023-10-18 | Disposition: A | Discharge status: Home / Self Care | Attending: Internal Medicine | Admitting: Internal Medicine

## 2023-10-15 ENCOUNTER — Emergency Department (HOSPITAL_COMMUNITY)

## 2023-10-15 DIAGNOSIS — N760 Acute vaginitis: Secondary | ICD-10-CM | POA: Diagnosis not present

## 2023-10-15 DIAGNOSIS — K5730 Diverticulosis of large intestine without perforation or abscess without bleeding: Secondary | ICD-10-CM | POA: Insufficient documentation

## 2023-10-15 DIAGNOSIS — E876 Hypokalemia: Secondary | ICD-10-CM

## 2023-10-15 DIAGNOSIS — R9389 Abnormal findings on diagnostic imaging of other specified body structures: Secondary | ICD-10-CM | POA: Diagnosis present

## 2023-10-15 DIAGNOSIS — B9689 Other specified bacterial agents as the cause of diseases classified elsewhere: Secondary | ICD-10-CM | POA: Diagnosis present

## 2023-10-15 DIAGNOSIS — K5900 Constipation, unspecified: Secondary | ICD-10-CM | POA: Diagnosis not present

## 2023-10-15 DIAGNOSIS — Z8639 Personal history of other endocrine, nutritional and metabolic disease: Secondary | ICD-10-CM | POA: Diagnosis not present

## 2023-10-15 DIAGNOSIS — Z79899 Other long term (current) drug therapy: Secondary | ICD-10-CM | POA: Diagnosis not present

## 2023-10-15 DIAGNOSIS — R109 Unspecified abdominal pain: Secondary | ICD-10-CM | POA: Diagnosis present

## 2023-10-15 DIAGNOSIS — R6510 Systemic inflammatory response syndrome (SIRS) of non-infectious origin without acute organ dysfunction: Principal | ICD-10-CM | POA: Diagnosis present

## 2023-10-15 DIAGNOSIS — Z7982 Long term (current) use of aspirin: Secondary | ICD-10-CM | POA: Diagnosis not present

## 2023-10-15 DIAGNOSIS — F1910 Other psychoactive substance abuse, uncomplicated: Secondary | ICD-10-CM | POA: Diagnosis not present

## 2023-10-15 LAB — URINALYSIS, ROUTINE W REFLEX MICROSCOPIC
Bilirubin Urine: NEGATIVE
Glucose, UA: NEGATIVE mg/dL
Ketones, ur: NEGATIVE mg/dL
Leukocytes,Ua: NEGATIVE
Nitrite: NEGATIVE
Protein, ur: 30 mg/dL — AB
Specific Gravity, Urine: 1.019 (ref 1.005–1.030)
pH: 6 (ref 5.0–8.0)

## 2023-10-15 LAB — COMPREHENSIVE METABOLIC PANEL WITH GFR
ALT: 10 U/L (ref 0–44)
AST: 17 U/L (ref 15–41)
Albumin: 3.3 g/dL — ABNORMAL LOW (ref 3.5–5.0)
Alkaline Phosphatase: 63 U/L (ref 38–126)
Anion gap: 15 (ref 5–15)
BUN: 5 mg/dL — ABNORMAL LOW (ref 6–20)
CO2: 25 mmol/L (ref 22–32)
Calcium: 9.1 mg/dL (ref 8.9–10.3)
Chloride: 100 mmol/L (ref 98–111)
Creatinine, Ser: 1.05 mg/dL — ABNORMAL HIGH (ref 0.44–1.00)
GFR, Estimated: >60 mL/min (ref >60)
Glucose, Bld: 111 mg/dL — ABNORMAL HIGH (ref 70–99)
Potassium: 3.1 mmol/L — ABNORMAL LOW (ref 3.5–5.1)
Sodium: 140 mmol/L (ref 135–145)
Total Bilirubin: 0.8 mg/dL (ref 0.0–1.2)
Total Protein: 7.5 g/dL (ref 6.5–8.1)

## 2023-10-15 LAB — CBC WITH DIFFERENTIAL/PLATELET
Abs Immature Granulocytes: 0.05 K/uL (ref 0.00–0.07)
Basophils Absolute: 0.1 K/uL (ref 0.0–0.1)
Basophils Relative: 0 %
Eosinophils Absolute: 0 K/uL (ref 0.0–0.5)
Eosinophils Relative: 0 %
HCT: 40.4 % (ref 36.0–46.0)
Hemoglobin: 13.3 g/dL (ref 12.0–15.0)
Immature Granulocytes: 0 %
Lymphocytes Relative: 15 %
Lymphs Abs: 2.3 K/uL (ref 0.7–4.0)
MCH: 28.5 pg (ref 26.0–34.0)
MCHC: 32.9 g/dL (ref 30.0–36.0)
MCV: 86.7 fL (ref 80.0–100.0)
Monocytes Absolute: 0.8 K/uL (ref 0.1–1.0)
Monocytes Relative: 5 %
Neutro Abs: 12.4 K/uL — ABNORMAL HIGH (ref 1.7–7.7)
Neutrophils Relative %: 80 %
Platelets: 278 K/uL (ref 150–400)
RBC: 4.66 MIL/uL (ref 3.87–5.11)
RDW: 13.4 % (ref 11.5–15.5)
WBC: 15.6 K/uL — ABNORMAL HIGH (ref 4.0–10.5)
nRBC: 0 % (ref 0.0–0.2)

## 2023-10-15 LAB — I-STAT CHEM 8, ED
BUN: 5 mg/dL — ABNORMAL LOW (ref 6–20)
Calcium, Ion: 1.12 mmol/L — ABNORMAL LOW (ref 1.15–1.40)
Chloride: 100 mmol/L (ref 98–111)
Creatinine, Ser: 0.9 mg/dL (ref 0.44–1.00)
Glucose, Bld: 112 mg/dL — ABNORMAL HIGH (ref 70–99)
HCT: 41 % (ref 36.0–46.0)
Hemoglobin: 13.9 g/dL (ref 12.0–15.0)
Potassium: 3 mmol/L — ABNORMAL LOW (ref 3.5–5.1)
Sodium: 140 mmol/L (ref 135–145)
TCO2: 26 mmol/L (ref 22–32)

## 2023-10-15 LAB — LIPASE, BLOOD: Lipase: 24 U/L (ref 11–51)

## 2023-10-15 LAB — I-STAT CG4 LACTIC ACID, ED: Lactic Acid, Venous: 1.5 mmol/L (ref 0.5–1.9)

## 2023-10-15 MED ORDER — LACTATED RINGERS IV BOLUS
1000.0000 mL | Freq: Once | INTRAVENOUS | Status: AC
  Administered 2023-10-16: 1000 mL via INTRAVENOUS

## 2023-10-15 MED ORDER — ACETAMINOPHEN 325 MG PO TABS
650.0000 mg | ORAL_TABLET | Freq: Once | ORAL | Status: AC
  Administered 2023-10-15: 650 mg via ORAL
  Filled 2023-10-15: qty 2

## 2023-10-15 MED ORDER — IOHEXOL 350 MG/ML SOLN
75.0000 mL | Freq: Once | INTRAVENOUS | Status: AC | PRN
  Administered 2023-10-15: 75 mL via INTRAVENOUS

## 2023-10-15 MED ORDER — POTASSIUM CHLORIDE CRYS ER 20 MEQ PO TBCR
40.0000 meq | EXTENDED_RELEASE_TABLET | Freq: Once | ORAL | Status: AC
  Administered 2023-10-16: 40 meq via ORAL
  Filled 2023-10-15: qty 2

## 2023-10-15 NOTE — ED Provider Notes (Signed)
 [place] EMERGENCY DEPARTMENT AT [facility] Provider Note   CSN: [id number] Arrival date & time: 10/15/23  1300     Patient presents with: Abdominal Pain   [name] [name] is a 58 y.o. female.   Abdominal Pain Patient is a 58 year old female presenting today for concerns for right-sided abdominal pain has been present x 3 days, seen in the emergency department 2 days ago with reassuring CT, noting uterine fibroids.  States that she was given Bentyl  and Zofran  which have not helped today.  Noted vaginal discharge today.  Reporting vaginal bleeding 2 weeks ago, but has not had any since.  This is abnormal for her being postmenopausal.  Reports that she has had diarrhea on Monday but since has not had bowel movement.  Endorses subjective fever with unknown temperature at home.  Denies headache, vision changes, vertigo, chest pain, shortness of breath, nausea, vomiting, melena, hematochezia, vaginal pain, lower leg swelling.     Prior to Admission medications   Medication Sig Start Date End Date Taking? Authorizing Provider  acetaminophen  (TYLENOL ) 500 MG tablet Take 1,000 mg by mouth 2 (two) times daily as needed for mild pain.     [provider]  albuterol  (PROAIR  HFA) 108 (90 Base) MCG/ACT inhaler INHALE TWO PUFFS INTO LUNGS EVERY 4 HOURS AS NEEDED FOR WHEEZING FOR SHORTNESS OF BREATH 04/22/18   Shelah Lamar RAMAN, MD  aspirin  EC 81 MG EC tablet Take 1 tablet (81 mg total) by mouth daily. 02/12/13   Vivienne Lonni Ingle, NP  budesonide -formoterol  (SYMBICORT ) 160-4.5 MCG/ACT inhaler Inhale 2 puffs into the lungs 2 (two) times daily. 04/22/18   Byrum, Robert S, MD  carvedilol  (COREG ) 3.125 MG tablet Take 1 tablet (3.125 mg total) by mouth 2 (two) times daily with a meal. Please make overdue appt with Dr. Maranda before anymore refills. 1st attempt 11/18/18   Maranda Leim DEL, MD  cholecalciferol (VITAMIN D ) 1000 units tablet Take 1,000 Units by mouth daily.     [provider]  clopidogrel  (PLAVIX ) 75 MG tablet Take 1 tablet (75 mg total) by mouth daily. 12/09/17   Maranda Leim DEL, MD  dicyclomine  (BENTYL ) 20 MG tablet Take 1 tablet (20 mg total) by mouth 3 (three) times daily as needed (abdominal spasms). 10/14/23   Cardama, Raynell Moder, MD  furosemide  (LASIX ) 20 MG tablet TAKE ONE TABLET BY MOUTH ONCE DAILY AS NEEDED FOR  FLUID  OR  EDEMA 02/25/17   Maranda Leim DEL, MD  HYDROcodone -acetaminophen  (NORCO/VICODIN) 5-325 MG tablet Take 1 tablet by mouth every 6 (six) hours as needed for moderate pain. 06/28/17   Cornett, Debby, MD  loratadine  (CLARITIN ) 10 MG tablet Take 10 mg by mouth daily.    [provider]  nitroGLYCERIN  (NITROSTAT ) 0.4 MG SL tablet DISSOLVE ONE TABLET UNDER THE TONGUE EVERY 5 MINUTES AS NEEDED FOR CHEST PAIN.  DO NOT EXCEED A TOTAL OF 3 DOSES IN 15 MINUTES 11/03/15   Maranda Leim DEL, MD  nitroGLYCERIN  (NITROSTAT ) 0.4 MG SL tablet PLACE ONE TABLET UNDER THE TONGUE EVERY 5 MINUTES FOR 3 DOSES AS NEEDED FOR CHEST PAIN 07/22/17   Maranda Leim DEL, MD  ondansetron  (ZOFRAN -ODT) 4 MG disintegrating tablet Take 1 tablet (4 mg total) by mouth every 8 (eight) hours as needed. 10/14/23   Cardama, Raynell Moder, MD  potassium chloride  (K-DUR) 10 MEQ tablet TAKE ONE TABLET BY MOUTH ONCE DAILY AS NEEDED. ONLY TAKE WHEN TAKING AS NEEDED FUROSEMIDE . 02/25/17   Maranda Leim DEL, MD  pravastatin  (PRAVACHOL ) 40 MG tablet Take 1 tablet (40 mg total) by mouth daily. Please make overdue appt with Dr. Maranda before anymore refills. 1st attempt 11/18/18   Maranda Leim DEL, MD  traMADol  (ULTRAM ) 50 MG tablet Take 1-2 tablets (50-100 mg total) by mouth every 6 (six) hours as needed for moderate pain. 06/27/17   Eletha Boas, MD    Allergies: Demerol  [meperidine ], Latex, and Novocain [procaine hcl]    Review of Systems  Gastrointestinal:  Positive for abdominal pain.  All other systems reviewed and are negative.   Updated Vital  Signs BP (!) 148/121 (BP Location: Right Arm)   Pulse (!) 102   Temp 98.1 F (36.7 C) (Oral)   Resp 20   LMP 01/31/2014   SpO2 100%   Physical Exam Vitals and nursing note reviewed. Exam conducted with a chaperone present.  Constitutional:      General: She is not in acute distress.    Appearance: Normal appearance. She is not ill-appearing or diaphoretic.  HENT:     Head: Normocephalic and atraumatic.  Eyes:     General: No scleral icterus.       Right eye: No discharge.        Left eye: No discharge.     Extraocular Movements: Extraocular movements intact.     Conjunctiva/sclera: Conjunctivae normal.  Cardiovascular:     Rate and Rhythm: Normal rate and regular rhythm.     Pulses: Normal pulses.     Heart sounds: Normal heart sounds. No murmur heard.    No friction rub. No gallop.  Pulmonary:     Effort: Pulmonary effort is normal. No respiratory distress.     Breath sounds: No stridor. No wheezing, rhonchi or rales.  Chest:     Chest wall: No tenderness.  Abdominal:     General: Abdomen is flat. There is no distension.     Palpations: Abdomen is soft.     Tenderness: There is abdominal tenderness in the right upper quadrant and right lower quadrant. There is guarding. There is no right CVA tenderness, left CVA tenderness or rebound.  Genitourinary:    Exam position: Knee-chest position.     Vagina: No signs of injury. Vaginal discharge present. No tenderness or bleeding.     Cervix: No cervical motion tenderness.     Uterus: Not tender.      Adnexa:        Right: No tenderness.         Left: No tenderness.    Musculoskeletal:        General: No swelling, deformity or signs of injury.     Cervical back: Normal range of motion. No rigidity.     Right lower leg: No edema.     Left lower leg: No edema.  Skin:    General: Skin is warm and dry.     Findings: No bruising, erythema or lesion.  Neurological:     General: No focal deficit present.     Mental Status: She  is alert and oriented to person, place, and time. Mental status is at baseline.     Sensory: No sensory deficit.     Motor: No weakness.  Psychiatric:        Mood and Affect: Mood normal.     (all labs ordered are listed, but only abnormal results are displayed) Labs Reviewed  WET PREP, GENITAL - Abnormal; Notable for the following components:      Result Value   Clue  Cells Wet Prep HPF POC PRESENT (*)    All other components within normal limits  CBC WITH DIFFERENTIAL/PLATELET - Abnormal; Notable for the following components:   WBC 15.6 (*)    Neutro Abs 12.4 (*)    All other components within normal limits  COMPREHENSIVE METABOLIC PANEL WITH GFR - Abnormal; Notable for the following components:   Potassium 3.1 (*)    Glucose, Bld 111 (*)    BUN 5 (*)    Creatinine, Ser 1.05 (*)    Albumin 3.3 (*)    All other components within normal limits  URINALYSIS, ROUTINE W REFLEX MICROSCOPIC - Abnormal; Notable for the following components:   Color, Urine AMBER (*)    APPearance CLOUDY (*)    Hgb urine dipstick MODERATE (*)    Protein, ur 30 (*)    Bacteria, UA FEW (*)    All other components within normal limits  I-STAT CHEM 8, ED - Abnormal; Notable for the following components:   Potassium 3.0 (*)    BUN 5 (*)    Glucose, Bld 112 (*)    Calcium , Ion 1.12 (*)    All other components within normal limits  CULTURE, BLOOD (ROUTINE X 2)  CULTURE, BLOOD (ROUTINE X 2)  URINE CULTURE  LIPASE, BLOOD  LIPASE, BLOOD  CBC WITH DIFFERENTIAL/PLATELET  COMPREHENSIVE METABOLIC PANEL WITH GFR  MAGNESIUM   I-STAT CG4 LACTIC ACID, ED  GC/CHLAMYDIA PROBE AMP () NOT AT [facility]    EKG: None  Radiology: US  Abdomen Limited RUQ (LIVER/GB) Result Date: 10/16/2023 CLINICAL DATA:  [id number].  Right upper quadrant pain. EXAM: ULTRASOUND ABDOMEN LIMITED RIGHT UPPER QUADRANT COMPARISON:  CT today and 10/13/2023. FINDINGS: Gallbladder: No gallstones or wall thickening visualized. No sonographic  Murphy sign noted by sonographer. Common bile duct: Diameter: 2.9 mm.  No intrahepatic biliary dilatation. Liver: No focal lesion identified. Within normal limits in parenchymal echogenicity. Portal vein is patent on color Doppler imaging with normal direction of blood flow towards the liver. Other: No right upper quadrant ascites. IMPRESSION: Negative ultrasound. Electronically Signed   By: Francis Quam M.D.   On: 10/16/2023 02:58   CT ABDOMEN PELVIS W CONTRAST Result Date: 10/15/2023 CLINICAL DATA:  Acute abdominal pain EXAM: CT ABDOMEN AND PELVIS WITH CONTRAST TECHNIQUE: Multidetector CT imaging of the abdomen and pelvis was performed using the standard protocol following bolus administration of intravenous contrast. RADIATION DOSE REDUCTION: This exam was performed according to the departmental dose-optimization program which includes automated exposure control, adjustment of the mA and/or kV according to patient size and/or use of iterative reconstruction technique. CONTRAST:  75mL OMNIPAQUE  IOHEXOL  350 MG/ML SOLN COMPARISON:  CT abdomen pelvis October 13, 2023 FINDINGS: Lower chest: Mild centrilobular emphysematous changes. Hepatobiliary: Focal fat in segment 4 along the falciform ligament, typical location. No enhancing liver lesion. Gallbladder is unremarkable. No intrahepatic biliary dilatation. CBD measures 6.7 mm. Pancreas: Pancreatic duct is slightly prominent measures up to 3.9 mm in the pancreatic head and neck. No definite pancreatic mass identified. No peripancreatic fat stranding or fluid collection. Spleen: Normal spleen. Adrenals/Urinary Tract: A few punctate bilateral cortical hypodensities in both kidneys, too small to characterize likely representing small simple cysts which does not require imaging follow-up. No hydronephrosis or enhancing mass lesion. Both adrenal glands are unremarkable. Normal bladder. Stomach/Bowel: Prominent gastric folds otherwise unremarkable stomach. Colonic  diverticulosis without CT findings to suggest diverticulitis. Appendix is not visualized. Large stool is identified throughout the colon. Gaseous distention of splenic flexure of the colon.  Vascular/Lymphatic: Aortic atherosclerosis. No enlarged abdominal or pelvic lymph nodes. Reproductive: Enlarged heterogeneous fibroid uterus no definite adnexal mass. Other: No hiatal hernia.  No ascites. Musculoskeletal: No suspicious osseous lesion. IMPRESSION: Colonic diverticulosis without diverticulitis. Large stool throughout the colon with gaseous distention of the splenic flexure of the colon. Uterine fibroids. Electronically Signed   By: Megan  Zare M.D.   On: 10/15/2023 20:53    Procedures   Medications Ordered in the ED  metroNIDAZOLE  (FLAGYL ) IVPB 500 mg (has no administration in time range)  acetaminophen  (TYLENOL ) tablet 650 mg (has no administration in time range)    Or  acetaminophen  (TYLENOL ) suppository 650 mg (has no administration in time range)  naloxone  (NARCAN ) injection 0.4 mg (has no administration in time range)  HYDROmorphone  (DILAUDID ) injection 0.5 mg (has no administration in time range)  lactated ringers  infusion (has no administration in time range)  melatonin tablet 3 mg (has no administration in time range)  ondansetron  (ZOFRAN ) injection 4 mg (has no administration in time range)  acetaminophen  (TYLENOL ) tablet 650 mg (650 mg Oral Given 10/15/23 1952)  iohexol  (OMNIPAQUE ) 350 MG/ML injection 75 mL (75 mLs Intravenous Contrast Given 10/15/23 2004)  lactated ringers  bolus 1,000 mL (0 mLs Intravenous Stopped 10/16/23 0203)  potassium chloride  SA (KLOR-CON  M) CR tablet 40 mEq (40 mEq Oral Given 10/16/23 0026)  morphine  (PF) 4 MG/ML injection 4 mg (4 mg Intravenous Given 10/16/23 0203)  ondansetron  (ZOFRAN ) injection 4 mg (4 mg Intravenous Given 10/16/23 0203)   Medical Decision Making Amount and/or Complexity of Data Reviewed Labs: ordered. Radiology:  ordered.  Risk Prescription drug management. Decision regarding hospitalization.   This patient is a 58 year old female who presents to the ED for concern of right sided lower abdominal pain present for the last 4 days, evaluated in the emergency department 3 days ago with reassuring CT, noting bowel movements at that time but has not had any since.  Has had persistent fever, malaise and worsening right sided abdominal pain.  Reported to have some discharge that is abnormal for her.  On physical exam, noted to be febrile, tachycardic, and mildly hypotensive.  Patient states that she has not been eating and drinking much since being seen in the ER.  Noted to have exquisite right-sided abdominal tenderness to palpation with guarding.  GU exam was notable for thin white discharge but otherwise unremarkable for any bleeding, no cervical motion tenderness.  The patient's current presentation, repeated CT and did show some gaseous distention with heavy stool burden.  No other obvious signs of infection.  With elevated white count noted on repeat labs, RUQ ultrasound was ordered.  No signs of cholecystitis.  Unknown cause The patient's fever.  Provide metronidazole  for the BV noted on wet prep.  GC pending.  With patient's current symptoms, believe it would be warranted for her to be observed.  Patient care then transferred over to hospitalist, Dr. Eva Pore.  Differential diagnoses prior to evaluation: The emergent differential diagnosis includes, but is not limited to, appendicitis, Diverticulitis, Gastritis, Enteritis, Incarcerated or Strangulated Hernia, Nephrolithiasis , Pyelonephritis, Inflammatory Bowel Disease, Mesenteric Adenitis, Gastroenteritis, Constipation,  Ovarian Torsion, Ruptured Ovarian Cyst, Pelvic Inflammatory Disease (PID), Tubo-Ovarian Abscess, cholecystitis, pancreatitis, sepsis this is not an exhaustive differential.   Past Medical History / Co-morbidities / Social  History: HLD, CAD, COPD, HTN, TIA, cocaine abuse, hypothyroidism, anemia, migraines, CAD, anxiety  Additional history: Chart reviewed. Pertinent results include:   Seen on 10/13/2023 for lower abdominal pain with reassuring CT at  that time and also noted to be febrile, suspected to be secondary to viral gastroenteritis.  Provided Bentyl  and Zofran .  Lab Tests/Imaging studies: I personally interpreted labs/imaging and the pertinent results include:   CBC notes a leukocytosis of 15.6 CMP shows a hypokalemia 3.1 and elevated creatinine 1.5 UA notes moderate hemoglobin and few bacteria otherwise unremarkable Lactic acid unremarkable Cultures pending, magnesium  pending, lipase unremarkable Prep positive for clue cells GC pending CT abdomen shows stool burden with no signs of diverticulitis and has not does show some gaseous distention near the splenic flexure. Right upper quadrant ultrasound was negative for cholecystitis I agree with the radiologist interpretation.    Medications: I ordered medication including metronidazole , LR, morphine , Zofran , potassium, Tylenol .  I have reviewed the patients home medicines and have made adjustments as needed.  Critical Interventions: None  Social Determinants of Health: None  Disposition: After consideration of the diagnostic results and the patients response to treatment, I feel that the patient would benefit from admission, further observation with patient care transferred to Dr. Eva Pore.  Final diagnoses:  Right sided abdominal pain  BV (bacterial vaginosis)  SIRS (systemic inflammatory response syndrome) [facility])  Hypokalemia    ED Discharge Orders     None          Beola Terrall RAMAN, [place] 10/16/23 0357    Theadore Ozell HERO, MD 10/16/23 [phone]

## 2023-10-15 NOTE — ED Triage Notes (Signed)
 Pt here for abdominal pain, seen for same Monday at [facility]. Pt has hx of diverticulitis but said pain was worse than any of her previous flare ups. CT as [place] showed a tumor but pt was told it was benign. Abdominal pain getting worse since Monday, meds prescribed are not helping.

## 2023-10-15 NOTE — ED Provider Notes (Incomplete)
  EMERGENCY DEPARTMENT AT [facility] Provider Note   CSN: [id number] Arrival date & time: 10/15/23  1300     Patient presents with: Abdominal Pain   [name] [name] is a 58 y.o. female.   Abdominal Pain Patient is a 58 year old female presenting today for concerns for right-sided abdominal pain has been present x 3 days, seen in the emergency department 2 days ago with reassuring CT, noting uterine fibroids.  States that she was given Bentyl  and Zofran  which have not helped today.  Noted vaginal discharge today.  Reporting vaginal bleeding 2 weeks ago, but has not had any since.  This is abnormal for her being postmenopausal.  Reports that she has had diarrhea on Monday but since has not had bowel movement.  Endorses subjective fever with unknown temperature at home.  Denies headache, vision changes, vertigo, chest pain, shortness of breath, nausea, vomiting, melena, hematochezia, vaginal pain, lower leg swelling.     Prior to Admission medications   Medication Sig Start Date End Date Taking? Authorizing Provider  acetaminophen  (TYLENOL ) 500 MG tablet Take 1,000 mg by mouth 2 (two) times daily as needed for mild pain.     [provider]  albuterol  (PROAIR  HFA) 108 (90 Base) MCG/ACT inhaler INHALE TWO PUFFS INTO LUNGS EVERY 4 HOURS AS NEEDED FOR WHEEZING FOR SHORTNESS OF BREATH 04/22/18   Shelah Lamar RAMAN, MD  aspirin  EC 81 MG EC tablet Take 1 tablet (81 mg total) by mouth daily. 02/12/13   Vivienne Lonni Ingle, NP  budesonide -formoterol  (SYMBICORT ) 160-4.5 MCG/ACT inhaler Inhale 2 puffs into the lungs 2 (two) times daily. 04/22/18   Byrum, Robert S, MD  carvedilol  (COREG ) 3.125 MG tablet Take 1 tablet (3.125 mg total) by mouth 2 (two) times daily with a meal. Please make overdue appt with Dr. Maranda before anymore refills. 1st attempt 11/18/18   Maranda Leim DEL, MD  cholecalciferol (VITAMIN D ) 1000 units tablet Take 1,000 Units by mouth daily.     [provider]  clopidogrel  (PLAVIX ) 75 MG tablet Take 1 tablet (75 mg total) by mouth daily. 12/09/17   Maranda Leim DEL, MD  dicyclomine  (BENTYL ) 20 MG tablet Take 1 tablet (20 mg total) by mouth 3 (three) times daily as needed (abdominal spasms). 10/14/23   Trine Raynell Moder, MD  furosemide  (LASIX ) 20 MG tablet TAKE ONE TABLET BY MOUTH ONCE DAILY AS NEEDED FOR  FLUID  OR  EDEMA 02/25/17   Maranda Leim DEL, MD  HYDROcodone -acetaminophen  (NORCO/VICODIN) 5-325 MG tablet Take 1 tablet by mouth every 6 (six) hours as needed for moderate pain. 06/28/17   Cornett, Debby, MD  loratadine  (CLARITIN ) 10 MG tablet Take 10 mg by mouth daily.    [provider]  nitroGLYCERIN  (NITROSTAT ) 0.4 MG SL tablet DISSOLVE ONE TABLET UNDER THE TONGUE EVERY 5 MINUTES AS NEEDED FOR CHEST PAIN.  DO NOT EXCEED A TOTAL OF 3 DOSES IN 15 MINUTES 11/03/15   Maranda Leim DEL, MD  nitroGLYCERIN  (NITROSTAT ) 0.4 MG SL tablet PLACE ONE TABLET UNDER THE TONGUE EVERY 5 MINUTES FOR 3 DOSES AS NEEDED FOR CHEST PAIN 07/22/17   Maranda Leim DEL, MD  ondansetron  (ZOFRAN -ODT) 4 MG disintegrating tablet Take 1 tablet (4 mg total) by mouth every 8 (eight) hours as needed. 10/14/23   Trine Raynell Moder, MD  potassium chloride  (K-DUR) 10 MEQ tablet TAKE ONE TABLET BY MOUTH ONCE DAILY AS NEEDED. ONLY TAKE WHEN TAKING AS NEEDED FUROSEMIDE . 02/25/17   Maranda Leim DEL, MD  pravastatin  (PRAVACHOL ) 40 MG tablet Take 1 tablet (40 mg total) by mouth daily. Please make overdue appt with Dr. Maranda before anymore refills. 1st attempt 11/18/18   Maranda Leim DEL, MD  traMADol  (ULTRAM ) 50 MG tablet Take 1-2 tablets (50-100 mg total) by mouth every 6 (six) hours as needed for moderate pain. 06/27/17   Eletha Boas, MD    Allergies: Demerol  [meperidine ], Latex, and Novocain [procaine hcl]    Review of Systems  Gastrointestinal:  Positive for abdominal pain.  All other systems reviewed and are negative.   Updated Vital  Signs BP 118/72 (BP Location: Right Arm)   Pulse 100   Temp 98.4 F (36.9 C) (Oral)   Resp 16   LMP 01/31/2014   SpO2 98%   Physical Exam Vitals and nursing note reviewed.  Constitutional:      General: She is not in acute distress.    Appearance: Normal appearance. She is not ill-appearing or diaphoretic.  HENT:     Head: Normocephalic and atraumatic.  Eyes:     General: No scleral icterus.       Right eye: No discharge.        Left eye: No discharge.     Extraocular Movements: Extraocular movements intact.     Conjunctiva/sclera: Conjunctivae normal.  Cardiovascular:     Rate and Rhythm: Normal rate and regular rhythm.     Pulses: Normal pulses.     Heart sounds: Normal heart sounds. No murmur heard.    No friction rub. No gallop.  Pulmonary:     Effort: Pulmonary effort is normal. No respiratory distress.     Breath sounds: No stridor. No wheezing, rhonchi or rales.  Chest:     Chest wall: No tenderness.  Abdominal:     General: Abdomen is flat. There is no distension.     Palpations: Abdomen is soft.     Tenderness: There is abdominal tenderness in the right upper quadrant and right lower quadrant. There is guarding. There is no right CVA tenderness, left CVA tenderness or rebound.  Musculoskeletal:        General: No swelling, deformity or signs of injury.     Cervical back: Normal range of motion. No rigidity.     Right lower leg: No edema.     Left lower leg: No edema.  Skin:    General: Skin is warm and dry.     Findings: No bruising, erythema or lesion.  Neurological:     General: No focal deficit present.     Mental Status: She is alert and oriented to person, place, and time. Mental status is at baseline.     Sensory: No sensory deficit.     Motor: No weakness.  Psychiatric:        Mood and Affect: Mood normal.     (all labs ordered are listed, but only abnormal results are displayed) Labs Reviewed  CBC WITH DIFFERENTIAL/PLATELET - Abnormal; Notable  for the following components:      Result Value   WBC 15.6 (*)    Neutro Abs 12.4 (*)    All other components within normal limits  COMPREHENSIVE METABOLIC PANEL WITH GFR - Abnormal; Notable for the following components:   Potassium 3.1 (*)    Glucose, Bld 111 (*)    BUN 5 (*)    Creatinine, Ser 1.05 (*)    Albumin 3.3 (*)    All other components within normal limits  URINALYSIS, ROUTINE W REFLEX MICROSCOPIC -  Abnormal; Notable for the following components:   Color, Urine AMBER (*)    APPearance CLOUDY (*)    Hgb urine dipstick MODERATE (*)    Protein, ur 30 (*)    Bacteria, UA FEW (*)    All other components within normal limits  I-STAT CHEM 8, ED - Abnormal; Notable for the following components:   Potassium 3.0 (*)    BUN 5 (*)    Glucose, Bld 112 (*)    Calcium , Ion 1.12 (*)    All other components within normal limits  CULTURE, BLOOD (ROUTINE X 2)  CULTURE, BLOOD (ROUTINE X 2)  LIPASE, BLOOD  I-STAT CG4 LACTIC ACID, ED    EKG: None  Radiology: CT ABDOMEN PELVIS W CONTRAST Result Date: 10/15/2023 CLINICAL DATA:  Acute abdominal pain EXAM: CT ABDOMEN AND PELVIS WITH CONTRAST TECHNIQUE: Multidetector CT imaging of the abdomen and pelvis was performed using the standard protocol following bolus administration of intravenous contrast. RADIATION DOSE REDUCTION: This exam was performed according to the departmental dose-optimization program which includes automated exposure control, adjustment of the mA and/or kV according to patient size and/or use of iterative reconstruction technique. CONTRAST:  75mL OMNIPAQUE  IOHEXOL  350 MG/ML SOLN COMPARISON:  CT abdomen pelvis October 13, 2023 FINDINGS: Lower chest: Mild centrilobular emphysematous changes. Hepatobiliary: Focal fat in segment 4 along the falciform ligament, typical location. No enhancing liver lesion. Gallbladder is unremarkable. No intrahepatic biliary dilatation. CBD measures 6.7 mm. Pancreas: Pancreatic duct is slightly  prominent measures up to 3.9 mm in the pancreatic head and neck. No definite pancreatic mass identified. No peripancreatic fat stranding or fluid collection. Spleen: Normal spleen. Adrenals/Urinary Tract: A few punctate bilateral cortical hypodensities in both kidneys, too small to characterize likely representing small simple cysts which does not require imaging follow-up. No hydronephrosis or enhancing mass lesion. Both adrenal glands are unremarkable. Normal bladder. Stomach/Bowel: Prominent gastric folds otherwise unremarkable stomach. Colonic diverticulosis without CT findings to suggest diverticulitis. Appendix is not visualized. Large stool is identified throughout the colon. Gaseous distention of splenic flexure of the colon. Vascular/Lymphatic: Aortic atherosclerosis. No enlarged abdominal or pelvic lymph nodes. Reproductive: Enlarged heterogeneous fibroid uterus no definite adnexal mass. Other: No hiatal hernia.  No ascites. Musculoskeletal: No suspicious osseous lesion. IMPRESSION: Colonic diverticulosis without diverticulitis. Large stool throughout the colon with gaseous distention of the splenic flexure of the colon. Uterine fibroids. Electronically Signed   By: Megan  Zare M.D.   On: 10/15/2023 20:53    {Document cardiac monitor, telemetry assessment procedure when appropriate:32947} Procedures   Medications Ordered in the ED  acetaminophen  (TYLENOL ) tablet 650 mg (650 mg Oral Given 10/15/23 1952)  iohexol  (OMNIPAQUE ) 350 MG/ML injection 75 mL (75 mLs Intravenous Contrast Given 10/15/23 2004)      {Click here for ABCD2, HEART and other calculators REFRESH Note before signing:1}                              Medical Decision Making  This patient is a ***  who presents to the ED for concern of ***.   Differential diagnoses prior to evaluation: The emergent differential diagnosis includes, but is not limited to,  *** . This is not an exhaustive differential.   Past Medical History /  Co-morbidities / Social History: HLD, CAD, COPD, HTN, TIA, cocaine abuse, hypothyroidism, anemia, migraines, CAD, anxiety  Additional history: Chart reviewed. Pertinent results include:   Seen on 10/13/2023 for lower abdominal pain with  reassuring CT at that time and also noted to be febrile, suspected to be secondary to viral gastroenteritis.  Provided Bentyl  and Zofran .  Lab Tests/Imaging studies: I personally interpreted labs/imaging and the pertinent results include:  ***.   ***I agree with the radiologist interpretation.  Cardiac monitoring: EKG obtained and interpreted by myself and attending physician which shows: ***   Medications: I ordered medication including ***.  I have reviewed the patients home medicines and have made adjustments as needed.  Critical Interventions:  Social Determinants of Health:  Disposition: After consideration of the diagnostic results and the patients response to treatment, I feel that the patient would benefit from ***.   ***emergency department workup does not suggest an emergent condition requiring admission or immediate intervention beyond what has been performed at this time. The plan is: ***. The patient is safe for discharge and has been instructed to return immediately for worsening symptoms, change in symptoms or any other concerns.   {Document critical care time when appropriate  Document review of labs and clinical decision tools ie CHADS2VASC2, etc  Document your independent review of radiology images and any outside records  Document your discussion with family members, caretakers and with consultants  Document social determinants of health affecting pt's care  Document your decision making why or why not admission, treatments were needed:32947:::1}   Final diagnoses:  None    ED Discharge Orders     None

## 2023-10-15 NOTE — ED Provider Triage Note (Signed)
 Emergency Medicine Provider Triage Evaluation Note  [name] [name] , a 58 y.o. female  was evaluated in triage.  Pt complains of abdominal pain, radiates to right back. Last bowel movement 2 days ago. No nausea or vomiting. Seen at [facility] 2 days ago for same and told having spasms.  IMPRESSION: 1. Colonic diverticulosis with no acute diverticulitis. 2. Intramural uterine fibroid. 3.  Aortic Atherosclerosis (ICD10-I70.0).     Electronically Signed   By: Morgane  Naveau M.D.   On: 10/13/2023 22:56 Review of Systems  Positive:  Negative:   Physical Exam  BP 94/77 (BP Location: Left Arm)   Pulse 95   Temp 98.7 F (37.1 C)   Resp 18   LMP 01/31/2014   SpO2 100%  Gen:   Awake, no distress   Resp:  Normal effort  MSK:   Moves extremities without difficulty  Other:  No CVA tenderness. Attempted to examine abdomen, patient grabbed my arm and pushed away. Further exam deferred.   Medical Decision Making  Medically screening exam initiated at 1:25 PM.  Appropriate orders placed.  Chantay K Smejkal was informed that the remainder of the evaluation will be completed by another provider, this initial triage assessment does not replace that evaluation, and the importance of remaining in the ED until their evaluation is complete.     Beverley Leita LABOR, PA-C 10/15/23 1326

## 2023-10-16 ENCOUNTER — Emergency Department (HOSPITAL_COMMUNITY)

## 2023-10-16 ENCOUNTER — Other Ambulatory Visit: Payer: Self-pay

## 2023-10-16 ENCOUNTER — Observation Stay (HOSPITAL_COMMUNITY)

## 2023-10-16 DIAGNOSIS — R9389 Abnormal findings on diagnostic imaging of other specified body structures: Secondary | ICD-10-CM

## 2023-10-16 DIAGNOSIS — Z8639 Personal history of other endocrine, nutritional and metabolic disease: Secondary | ICD-10-CM

## 2023-10-16 DIAGNOSIS — R109 Unspecified abdominal pain: Principal | ICD-10-CM

## 2023-10-16 DIAGNOSIS — N760 Acute vaginitis: Secondary | ICD-10-CM | POA: Diagnosis not present

## 2023-10-16 DIAGNOSIS — R6510 Systemic inflammatory response syndrome (SIRS) of non-infectious origin without acute organ dysfunction: Principal | ICD-10-CM

## 2023-10-16 DIAGNOSIS — K5900 Constipation, unspecified: Secondary | ICD-10-CM

## 2023-10-16 DIAGNOSIS — B9689 Other specified bacterial agents as the cause of diseases classified elsewhere: Secondary | ICD-10-CM

## 2023-10-16 LAB — CBC WITH DIFFERENTIAL/PLATELET
Abs Immature Granulocytes: 0.08 K/uL — ABNORMAL HIGH (ref 0.00–0.07)
Basophils Absolute: 0 K/uL (ref 0.0–0.1)
Basophils Relative: 0 %
Eosinophils Absolute: 0.1 K/uL (ref 0.0–0.5)
Eosinophils Relative: 0 %
HCT: 38 % (ref 36.0–46.0)
Hemoglobin: 12.4 g/dL (ref 12.0–15.0)
Immature Granulocytes: 1 %
Lymphocytes Relative: 14 %
Lymphs Abs: 1.9 K/uL (ref 0.7–4.0)
MCH: 28.2 pg (ref 26.0–34.0)
MCHC: 32.6 g/dL (ref 30.0–36.0)
MCV: 86.4 fL (ref 80.0–100.0)
Monocytes Absolute: 0.9 K/uL (ref 0.1–1.0)
Monocytes Relative: 6 %
Neutro Abs: 11.1 K/uL — ABNORMAL HIGH (ref 1.7–7.7)
Neutrophils Relative %: 79 %
Platelets: 246 K/uL (ref 150–400)
RBC: 4.4 MIL/uL (ref 3.87–5.11)
RDW: 13.3 % (ref 11.5–15.5)
WBC: 14.1 K/uL — ABNORMAL HIGH (ref 4.0–10.5)
nRBC: 0 % (ref 0.0–0.2)

## 2023-10-16 LAB — RESPIRATORY PANEL BY PCR

## 2023-10-16 LAB — COMPREHENSIVE METABOLIC PANEL WITH GFR
ALT: 9 U/L (ref 0–44)
AST: 22 U/L (ref 15–41)
Albumin: 3.1 g/dL — ABNORMAL LOW (ref 3.5–5.0)
Alkaline Phosphatase: 59 U/L (ref 38–126)
Anion gap: 16 — ABNORMAL HIGH (ref 5–15)
BUN: <5 mg/dL — ABNORMAL LOW (ref 6–20)
CO2: 24 mmol/L (ref 22–32)
Calcium: 8.8 mg/dL — ABNORMAL LOW (ref 8.9–10.3)
Chloride: 100 mmol/L (ref 98–111)
Creatinine, Ser: 0.86 mg/dL (ref 0.44–1.00)
GFR, Estimated: >60 mL/min (ref >60)
Glucose, Bld: 139 mg/dL — ABNORMAL HIGH (ref 70–99)
Potassium: 3.7 mmol/L (ref 3.5–5.1)
Sodium: 140 mmol/L (ref 135–145)
Total Bilirubin: 1.4 mg/dL — ABNORMAL HIGH (ref 0.0–1.2)
Total Protein: 6.8 g/dL (ref 6.5–8.1)

## 2023-10-16 LAB — PROCALCITONIN: Procalcitonin: <0.1 ng/mL

## 2023-10-16 LAB — RAPID URINE DRUG SCREEN, HOSP PERFORMED
Amphetamines: NOT DETECTED
Barbiturates: NOT DETECTED
Benzodiazepines: NOT DETECTED
Cocaine: POSITIVE — AB
Opiates: POSITIVE — AB
Tetrahydrocannabinol: POSITIVE — AB

## 2023-10-16 LAB — SEDIMENTATION RATE: Sed Rate: 40 mm/h — ABNORMAL HIGH (ref 0–22)

## 2023-10-16 LAB — SARS CORONAVIRUS 2 BY RT PCR: SARS Coronavirus 2 by RT PCR: NEGATIVE

## 2023-10-16 LAB — WET PREP, GENITAL
Sperm: NONE SEEN
Trich, Wet Prep: NONE SEEN
WBC, Wet Prep HPF POC: <10 (ref <10)
Yeast Wet Prep HPF POC: NONE SEEN

## 2023-10-16 LAB — GC/CHLAMYDIA PROBE AMP (~~LOC~~) NOT AT ARMC
Chlamydia: NEGATIVE
Comment: NEGATIVE
Comment: NORMAL
Neisseria Gonorrhea: NEGATIVE

## 2023-10-16 LAB — C-REACTIVE PROTEIN: CRP: 15.8 mg/dL — ABNORMAL HIGH (ref <1.0)

## 2023-10-16 LAB — MAGNESIUM: Magnesium: 1.7 mg/dL (ref 1.7–2.4)

## 2023-10-16 LAB — T4, FREE: Free T4: 1.05 ng/dL (ref 0.61–1.12)

## 2023-10-16 LAB — LIPASE, BLOOD: Lipase: 42 U/L (ref 11–51)

## 2023-10-16 MED ORDER — SODIUM CHLORIDE 0.9 % IV SOLN
2.0000 g | INTRAVENOUS | Status: DC
  Administered 2023-10-16: 2 g via INTRAVENOUS
  Filled 2023-10-16: qty 20

## 2023-10-16 MED ORDER — LACTATED RINGERS IV SOLN: INTRAVENOUS | Status: AC

## 2023-10-16 MED ORDER — MELATONIN 3 MG PO TABS: 3.0000 mg | ORAL_TABLET | Freq: Every evening | ORAL | Status: DC | PRN

## 2023-10-16 MED ORDER — ENOXAPARIN SODIUM 40 MG/0.4ML IJ SOSY
40.0000 mg | PREFILLED_SYRINGE | Freq: Every day | INTRAMUSCULAR | Status: DC
  Administered 2023-10-16: 40 mg via SUBCUTANEOUS
  Filled 2023-10-16 (×2): qty 0.4

## 2023-10-16 MED ORDER — ONDANSETRON HCL 4 MG/2ML IJ SOLN
4.0000 mg | Freq: Once | INTRAMUSCULAR | Status: AC
  Administered 2023-10-16: 4 mg via INTRAVENOUS
  Filled 2023-10-16: qty 2

## 2023-10-16 MED ORDER — ACETAMINOPHEN 650 MG RE SUPP: 650.0000 mg | Freq: Four times a day (QID) | RECTAL | Status: DC | PRN

## 2023-10-16 MED ORDER — ACETAMINOPHEN 325 MG PO TABS: 650.0000 mg | ORAL_TABLET | Freq: Four times a day (QID) | ORAL | Status: DC | PRN

## 2023-10-16 MED ORDER — NALOXONE HCL 0.4 MG/ML IJ SOLN: 0.4000 mg | INTRAMUSCULAR | Status: DC | PRN

## 2023-10-16 MED ORDER — ALBUTEROL SULFATE (2.5 MG/3ML) 0.083% IN NEBU: 2.5000 mg | INHALATION_SOLUTION | Freq: Four times a day (QID) | RESPIRATORY_TRACT | Status: DC | PRN

## 2023-10-16 MED ORDER — OXYCODONE-ACETAMINOPHEN 5-325 MG PO TABS
1.0000 | ORAL_TABLET | Freq: Four times a day (QID) | ORAL | Status: DC | PRN
  Administered 2023-10-17: 1 via ORAL
  Filled 2023-10-16: qty 1

## 2023-10-16 MED ORDER — HYDROMORPHONE HCL 1 MG/ML IJ SOLN
0.5000 mg | INTRAMUSCULAR | Status: DC | PRN
  Administered 2023-10-16 (×2): 0.5 mg via INTRAVENOUS
  Filled 2023-10-16: qty 1
  Filled 2023-10-16: qty 0.5

## 2023-10-16 MED ORDER — POLYETHYLENE GLYCOL 3350 17 GM/SCOOP PO POWD
119.0000 g | Freq: Once | ORAL | Status: AC
  Administered 2023-10-16: 119 g via ORAL
  Filled 2023-10-16: qty 119

## 2023-10-16 MED ORDER — MORPHINE SULFATE (PF) 4 MG/ML IV SOLN
4.0000 mg | Freq: Once | INTRAVENOUS | Status: AC
  Administered 2023-10-16: 4 mg via INTRAVENOUS
  Filled 2023-10-16: qty 1

## 2023-10-16 MED ORDER — ONDANSETRON HCL 4 MG/2ML IJ SOLN: 4.0000 mg | Freq: Four times a day (QID) | INTRAMUSCULAR | Status: DC | PRN

## 2023-10-16 MED ORDER — HYDRALAZINE HCL 20 MG/ML IJ SOLN: 10.0000 mg | INTRAMUSCULAR | Status: DC | PRN

## 2023-10-16 MED ORDER — HYDROCODONE-ACETAMINOPHEN 5-325 MG PO TABS: 1.0000 | ORAL_TABLET | Freq: Four times a day (QID) | ORAL | Status: DC | PRN

## 2023-10-16 MED ORDER — METRONIDAZOLE 500 MG/100ML IV SOLN
500.0000 mg | Freq: Once | INTRAVENOUS | Status: AC
  Administered 2023-10-16: 500 mg via INTRAVENOUS
  Filled 2023-10-16: qty 100

## 2023-10-16 MED ORDER — SODIUM CHLORIDE 0.9 % IV SOLN
500.0000 mg | INTRAVENOUS | Status: DC
  Administered 2023-10-16: 500 mg via INTRAVENOUS
  Filled 2023-10-16 (×2): qty 5

## 2023-10-16 MED ORDER — SODIUM CHLORIDE 0.9% FLUSH
3.0000 mL | Freq: Two times a day (BID) | INTRAVENOUS | Status: DC
  Administered 2023-10-16 – 2023-10-17 (×4): 3 mL via INTRAVENOUS

## 2023-10-16 NOTE — Progress Notes (Signed)
  Carryover admission to the Day Admitter.  I discussed this case with the EDP, Terrall First, PA.  Per these discussions:   This is a 58 year old female who is being admitted with persistent right-sided abdominal discomfort.   She presents with complaint of 3 to 4 days of right-sided abdominal discomfort, associated with intermittent subjective fever, intermittent nausea/vomiting associated nonbloody, nonbilious emesis.  She had presented to [facility] emergency department on 10/13/2023 with similar complaints, at which time CT abdomen/pelvis showed no evidence of acute process.  Was discharged to home, but has continued to experience recurrent right-sided abdominal discomfort as well as intermittent objective fever at home, prompting her to present to the [facility] emergency department this evening.  Temperature max in the ED this evening noted to be 101.1, which is resolved following dose of acetaminophen .  Additional vital signs in the ED notable for heart rates in the 80s to low 100s.  Her initial systolic blood pressures were soft in the 80s to 90s, Sosan improving with IV fluids, with most recent systolic blood pressures noted to be in the 140s mmHg.   Labs in the ED today were notable for CBC which showed blood cell count of 15,600.  CMP notable for potassium 3.1, creatinine of 1.05 compared to 0.80 on 10/13/2023, liver enzymes were nonelevated.  Lipase was also nonelevated.  Lactic acid 1.5.  Urinalysis showed no white blood cells.  She is also reported to show evidence of bacterial vaginosis for which she received a dose of Flagyl  in the ED.  CT abdomen/pelvis was reported to show evidence of large stool burden, but no evidence of bowel obstruction, abscess, or perforation, but demonstrating no evidence of additional acute intra-abdominal or acute intrapelvic process, including no evidence of acute gallbladder pathology.  Right upper quadrant ultrasound was reported to show no evidence of acute  process, including evidence of acute cholecystitis.  Blood cultures x 2 were collected in the ED this evening.  For her hypokalemia, she received a dose of oral potassium in the ED.   I have placed an order for observation for further evaluation management of the above.  I have placed some additional preliminary admit orders via the adult multi-morbid admission order set. I have also ordered prn IV Dilaudid , incentive spirometry, prn IV Zofran , lactated Ringer 's at 125 cc/h x 12 hours.  Have ordered morning labs in the form of CMP, CBC, magnesium  level as well as lipase level.  In the setting of her initial objective fever, also ordered chest x-ray.  Will defer to the admitting hospitalist additional management of reported bacterial vaginosis.    Eva Pore, DO Hospitalist

## 2023-10-16 NOTE — ED Notes (Signed)
 Patient off the floor to unit.

## 2023-10-16 NOTE — H&P (Signed)
 History and Physical    Patient: [name] [name] FMW:[id number] DOB: 1965-08-19 DOA: 10/15/2023 DOS: the patient was seen and examined on 10/16/2023 PCP: Patient, No Pcp Per  Patient coming from: Home  Chief Complaint:  Chief Complaint  Patient presents with   Abdominal Pain   HPI: [name] [name] is a 58 y.o. female with medical history significant of hypertension, CAD s/p stent, MI, thyroid  disease, anxiety, depression, and polysubstance abuse presents with abdominal pain.  She has been experiencing abdominal pain since Monday, which prompted her visit to [facility] emergency department. A scan was performed, revealing no significant findings. She says she was discharged home with a fever of 102F. Since returning home, her abdominal pain has worsened. She has not experienced nausea, vomiting, or diarrhea since Monday and has not had a bowel movement since then.  She has a history of emphysema and quit smoking cigarettes ten years ago, although she continues to smoke marijuana. She does not have current cough or cold symptoms, although she occasionally coughs without it being persistent.  In the emergency department patient was noted to be febrile up to 101.1 F with tachycardia and all other vital signs relatively maintained.  Labs significant for WBC 15.6, potassium 3.1, BUN 5, creatinine 1.05, and lactic acid 1.5.  Urinalysis was appeared to be contaminated.  Wet prep appeared to show clue cells present.  CT scan of the abdomen pelvis noted colonic diverticulosis without diverticulitis, large stool throughout the colon with gaseous distention of the splenic flexure of the colon, and uterine fibroids present.  Right upper quadrant ultrasound was negative for any acute abnormality.  Chest x-ray noted scattered ill-defined subtle hazy opacities in the periphery of the upper and mid lung fields concerning for atypical pneumonia.  Patient had been given 1 L of lactated Ringer 's, Zofran , morphine   IV, potassium chloride  40 meq, Dilaudid , and metronidazole .  Review of Systems: As mentioned in the history of present illness. All other systems reviewed and are negative. Past Medical History:  Diagnosis Date   Abnormal Pap smear    Colpo (twice)   Anemia    Anxiety and depression 01/01/2014   Breast disorder    soreness in left breast    CAD (coronary artery disease)    a. 01/2013 NSTEMI/Cath: LM nl, LAD 48m, LCX nl, RI nl, RCA nl, PDA ostial nonocclusive intraluminal thrombus, EF 65%, Treated with heparin  x 48 hrs->Med Rx (occurred in setting of + cocaine);  b. 01/2013 Echo: EF 60-65%, nl wall motion.   Chlamydia    years ago   Depression    Heart disease    MI Jan 2015   High cholesterol    Hypertension    denies    Hyperthyroidism    Migraines    occ   Myocardial infarction [facility])    Polysubstance abuse (HCC)    tobacco/cocaine/marijuana (+ UDS 01/2013).   Pre-diabetes    Thyroid  disease    Trichomonas    years ago   Past Surgical History:  Procedure Laterality Date   BREAST EXCISIONAL BIOPSY Right    BREAST SURGERY     lump removed from right breast   CERVICAL CONE BIOPSY     CESAREAN SECTION     2 c-sections   ECTOPIC PREGNANCY SURGERY     ENDOMETRIAL ABLATION W/ NOVASURE     GROIN MASS OPEN BIOPSY     LEFT HEART CATHETERIZATION WITH CORONARY ANGIOGRAM N/A 02/08/2013   Procedure: LEFT HEART CATHETERIZATION WITH CORONARY ANGIOGRAM;  Surgeon: Victory LELON Claudene DOUGLAS, MD;  Location: [facility] CATH LAB;  Service: Cardiovascular;  Laterality: N/A;   THYROID  LOBECTOMY Left 06/27/2017   Procedure: LEFT THYROID  LOBECTOMY;  Surgeon: Eletha Boas, MD;  Location: [facility] ORS;  Service: General;  Laterality: Left;   TUBAL LIGATION     Social History:  reports that she quit smoking about 10 years ago. Her smoking use included cigarettes. She started smoking about 41 years ago. She has a 15.5 pack-year smoking history. She has never used smokeless tobacco. She reports current alcohol use. She  reports current drug use. Frequency: 2.00 times per week. Drug: Marijuana.  Allergies  Allergen Reactions   Demerol  [Meperidine ] Itching   Latex Itching and Rash    gloves    Novocain [Procaine Hcl] Swelling    Family History  Problem Relation Age of Onset   Breast cancer Mother    Hypertension Brother    Kidney failure Brother    Hypertension Sister     Prior to Admission medications   Medication Sig Start Date End Date Taking? Authorizing Provider  albuterol  (PROAIR  HFA) 108 (90 Base) MCG/ACT inhaler INHALE TWO PUFFS INTO LUNGS EVERY 4 HOURS AS NEEDED FOR WHEEZING FOR SHORTNESS OF BREATH 04/22/18  Yes Shelah Lamar RAMAN, MD  aspirin  EC 81 MG EC tablet Take 1 tablet (81 mg total) by mouth daily. 02/12/13  Yes Vivienne Lonni Ingle, NP  dicyclomine  (BENTYL ) 20 MG tablet Take 1 tablet (20 mg total) by mouth 3 (three) times daily as needed (abdominal spasms). 10/14/23  Yes Cardama, Raynell Moder, MD  loratadine  (CLARITIN ) 10 MG tablet Take 10 mg by mouth daily.   Yes [provider]  ondansetron  (ZOFRAN -ODT) 4 MG disintegrating tablet Take 1 tablet (4 mg total) by mouth every 8 (eight) hours as needed. 10/14/23  Yes Cardama, Raynell Moder, MD  valACYclovir (VALTREX) 500 MG tablet Take 500 mg by mouth 2 (two) times daily. 12/29/20  Yes [provider]    Physical Exam: Vitals:   10/16/23 0131 10/16/23 0200 10/16/23 0250 10/16/23 0600  BP: (!) 136/95 (!) 148/121 (!) 148/121 133/78  Pulse: 93 (!) 102 (!) 102 (!) 102  Resp: 18  20 17   Temp: 98 F (36.7 C)  98.1 F (36.7 C)   TempSrc: Oral  Oral   SpO2: 100% 100% 100% 100%   Constitutional: Elderly female currently in acute distress Eyes: PERRL, lids and conjunctivae normal ENMT: Mucous membranes are moist. Posterior pharynx clear of any exudate or lesions.Normal dentition.  Neck: normal, supple  Respiratory: clear to auscultation bilaterally, no wheezing, no crackles. Normal respiratory effort. No accessory muscle  use.  Cardiovascular: R tachycardic.  No extremity edema. 2+ pedal pulses.   Abdomen: Tenderness to palpation of the right upper quadrant of the abdomen Musculoskeletal: no clubbing / cyanosis. No joint deformity upper and lower extremities. Good ROM, no contractures. Normal muscle tone.  Skin: no rashes, lesions, ulcers. No induration Neurologic: CN 2-12 grossly intact.  Strength 5/5 in all 4.  Psychiatric: Normal judgment and insight. Alert and oriented x 3. Normal mood.   Data Reviewed:  EKG reveals sinus tachycardia at 102 bpm.  Reviewed labs, imaging, pertinent records as documented.   Assessment and Plan:  SIRS Abdominal pain Acute.  Patient presents with complaints of abdominal pain and was noted to be febrile up to 101.1 F with tachycardia and leukocytosis meeting SIRS criteria.  Blood cultures were obtained.  Chest x-ray noted concern for atypical pneumonia.  Blood cultures have been  obtained.  Question possibility of underlying infection versus - Admit to a progressive bed - Follow-up blood cultures - Add-on procalcitonin, CRP, ESR - Antibiotics as listed below.  De-escalate antibiotics when deemed medically appropriate  Abnormal chest x-ray Patient reports having intermittent cough.  Chest x-ray scattered ill-defined subtle hazy opacities in the periphery of the upper and mid lung fields concerning for atypical pneumonia.  Possibility of an mitis - Follow-up procalcitonin - Check respiratory virus panel - Empiric antibiotics of Rocephin  and azithromycin  added  Bacterial vaginosis Present on admission.  Wet prep was obtained which noted clue cells present.  Patient was started on empiric antibiotics of metronidazole . - Continue metronidazole   Constipation Acute.  CT scan of the abdomen and pelvis noted large stool throughout the colon with gaseous distention of the splenic flexure of the colon. - Monitor intake and output - MiraLAX   History of thyroid  disease TSH  noted to be 0.51 when checked on 9/15. - Check free T4  Polysubstance abuse Patient admits to using marijuana.  Prior UDS screens positive for cocaine as well as marijuana.  Cocaine use may be the cause of patient's abdominal pain possibly elevated temperature - Follow-up UDS   DVT prophylaxis: Lovenox   Advance Care Planning:   Code Status: Full Code    Consults: None    Family Communication: None  Severity of Illness: The appropriate patient status for this patient is OBSERVATION. Observation status is judged to be reasonable and necessary in order to provide the required intensity of service to ensure the patient's safety. The patient's presenting symptoms, physical exam findings, and initial radiographic and laboratory data in the context of their medical condition is felt to place them at decreased risk for further clinical deterioration. Furthermore, it is anticipated that the patient will be medically stable for discharge from the hospital within 2 midnights of admission.   Author: Maximino DELENA Sharps, MD 10/16/2023 7:59 AM  For on call review [email or web address].

## 2023-10-16 NOTE — Progress Notes (Signed)
 Pt admitted to rm 21 from ED. Initiated tele. Oriented pt to the unit. VSS. Call bell within reach.   Oral Billings, RN

## 2023-10-17 DIAGNOSIS — B9689 Other specified bacterial agents as the cause of diseases classified elsewhere: Secondary | ICD-10-CM | POA: Diagnosis not present

## 2023-10-17 DIAGNOSIS — R109 Unspecified abdominal pain: Secondary | ICD-10-CM | POA: Diagnosis not present

## 2023-10-17 DIAGNOSIS — N760 Acute vaginitis: Secondary | ICD-10-CM | POA: Diagnosis not present

## 2023-10-17 LAB — CBC
HCT: 36.5 % (ref 36.0–46.0)
Hemoglobin: 12 g/dL (ref 12.0–15.0)
MCH: 28.8 pg (ref 26.0–34.0)
MCHC: 32.9 g/dL (ref 30.0–36.0)
MCV: 87.5 fL (ref 80.0–100.0)
Platelets: 293 K/uL (ref 150–400)
RBC: 4.17 MIL/uL (ref 3.87–5.11)
RDW: 13.6 % (ref 11.5–15.5)
WBC: 6.3 K/uL (ref 4.0–10.5)
nRBC: 0 % (ref 0.0–0.2)

## 2023-10-17 LAB — BASIC METABOLIC PANEL WITH GFR
Anion gap: 9 (ref 5–15)
BUN: 5 mg/dL — ABNORMAL LOW (ref 6–20)
CO2: 28 mmol/L (ref 22–32)
Calcium: 8.8 mg/dL — ABNORMAL LOW (ref 8.9–10.3)
Chloride: 105 mmol/L (ref 98–111)
Creatinine, Ser: 0.87 mg/dL (ref 0.44–1.00)
GFR, Estimated: >60 mL/min (ref >60)
Glucose, Bld: 103 mg/dL — ABNORMAL HIGH (ref 70–99)
Potassium: 3.5 mmol/L (ref 3.5–5.1)
Sodium: 142 mmol/L (ref 135–145)

## 2023-10-17 LAB — URINE CULTURE: Culture: <10000 — AB

## 2023-10-17 MED ORDER — HYDROMORPHONE HCL 1 MG/ML IJ SOLN: 0.5000 mg | INTRAMUSCULAR | Status: DC | PRN

## 2023-10-17 MED ORDER — SENNA 8.6 MG PO TABS
2.0000 | ORAL_TABLET | Freq: Every day | ORAL | Status: DC
  Administered 2023-10-17: 17.2 mg via ORAL
  Filled 2023-10-17: qty 2

## 2023-10-17 MED ORDER — BISACODYL 10 MG RE SUPP
10.0000 mg | Freq: Every day | RECTAL | Status: DC | PRN
Start: 2023-10-17 — End: 2023-10-18
  Filled 2023-10-17: qty 1

## 2023-10-17 MED ORDER — POTASSIUM CHLORIDE CRYS ER 20 MEQ PO TBCR
40.0000 meq | EXTENDED_RELEASE_TABLET | Freq: Once | ORAL | Status: AC
  Administered 2023-10-17: 40 meq via ORAL
  Filled 2023-10-17: qty 2

## 2023-10-17 MED ORDER — MAGNESIUM SULFATE 2 GM/50ML IV SOLN
2.0000 g | Freq: Once | INTRAVENOUS | Status: AC
  Administered 2023-10-17: 2 g via INTRAVENOUS
  Filled 2023-10-17: qty 50

## 2023-10-17 MED ORDER — METRONIDAZOLE 500 MG PO TABS
500.0000 mg | ORAL_TABLET | Freq: Two times a day (BID) | ORAL | Status: DC
Start: 2023-10-17 — End: 2023-10-18
  Administered 2023-10-17 – 2023-10-18 (×3): 500 mg via ORAL
  Filled 2023-10-17 (×3): qty 1

## 2023-10-17 MED ORDER — POLYETHYLENE GLYCOL 3350 17 G PO PACK
17.0000 g | PACK | Freq: Every day | ORAL | Status: DC
  Administered 2023-10-17: 17 g via ORAL
  Filled 2023-10-17: qty 1

## 2023-10-17 NOTE — Progress Notes (Signed)
 PROGRESS NOTE    [name] [name]  FMW:[id number] DOB: 06-Dec-1965 DOA: 10/15/2023 PCP: Patient, No Pcp Per    Brief Narrative:  [name] [name] is a 58 y.o. female with medical history significant of hypertension, CAD s/p stent, MI, thyroid  disease, anxiety, depression, and polysubstance abuse presents with abdominal pain.   Found to be cocaine positive and have constipation.   Assessment and Plan: SIRS Abdominal pain -See below regarding constipation   Abnormal chest x-ray -Workup essentially negative for bacterial pneumonia - Will DC IV antibiotics - Suspect due to cocaine   Bacterial vaginosis Present on admission.  Wet prep was obtained which noted clue cells present.  Patient was started on empiric antibiotics of metronidazole . - Continue metronidazole    Constipation Acute.  CT scan of the abdomen and pelvis noted large stool throughout the colon with gaseous distention of the splenic flexure of the colon. - Aggressive bowel regimen - Avoid narcotics   History of thyroid  disease TSH noted to be 0.51 when checked on 9/15. - Normal free T4   Polysubstance abuse -Encourage cessation     DVT prophylaxis: enoxaparin  (LOVENOX ) injection 40 mg Start: 10/16/23 1000 SCDs Start: 10/16/23 0346    Code Status: Full Code Family Communication:   Disposition Plan:  Level of care: Med-Surg Status is: Observation Suspect will DC in the a.m. after bowel regimen    Consultants:  None   Subjective: Only had small BM with yesterday's MiraLAX   Objective: Vitals:   10/16/23 2348 10/17/23 0311 10/17/23 0608 10/17/23 0820  BP: 119/79 97/61  120/78  Pulse:    95  Resp: 20 15 13 14   Temp: 98.5 F (36.9 C) 98.1 F (36.7 C)  98.5 F (36.9 C)  TempSrc: Oral Oral  Oral  SpO2:      Weight:   53.3 kg     Intake/Output Summary (Last 24 hours) at 10/17/2023 1111 Last data filed at 10/16/2023 2242 Gross per 24 hour  Intake 1000.21 ml  Output --  Net 1000.21 ml   Filed  Weights   10/17/23 0608  Weight: 53.3 kg    Examination:   General: Appearance:    Thin female in no acute distress     Lungs:     Clear to auscultation bilaterally, respirations unlabored  Heart:    Normal heart rate. Normal rhythm. No murmurs, rubs, or gallops.    MS:   All extremities are intact.    Neurologic:   Awake, alert, oriented x 3. No apparent focal neurological           defect.        Data Reviewed: I have personally reviewed following labs and imaging studies  CBC: Recent Labs  Lab 10/13/23 1947 10/15/23 1422 10/15/23 1428 10/16/23 0555 10/17/23 0648  WBC 13.6* 15.6*  --  14.1* 6.3  NEUTROABS  --  12.4*  --  11.1*  --   HGB 14.5 13.3 13.9 12.4 12.0  HCT 46.4* 40.4 41.0 38.0 36.5  MCV 88.2 86.7  --  86.4 87.5  PLT 285 278  --  246 293   Basic Metabolic Panel: Recent Labs  Lab 10/13/23 1947 10/15/23 1422 10/15/23 1428 10/16/23 0555 10/17/23 0648  NA 141 140 140 140 142  K 3.4* 3.1* 3.0* 3.7 3.5  CL 102 100 100 100 105  CO2 25 25  --  24 28  GLUCOSE 147* 111* 112* 139* 103*  BUN 8 5* 5* <5* 5*  CREATININE 0.80 1.05* 0.90 0.86  0.87  CALCIUM  9.6 9.1  --  8.8* 8.8*  MG  --   --   --  1.7  --    GFR: CrCl cannot be calculated (Unknown ideal weight.). Liver Function Tests: Recent Labs  Lab 10/13/23 1947 10/15/23 1422 10/16/23 0555  AST 15 17 22   ALT 8 10 9   ALKPHOS 76 63 59  BILITOT 0.7 0.8 1.4*  PROT 8.0 7.5 6.8  ALBUMIN 4.2 3.3* 3.1*   Recent Labs  Lab 10/13/23 1947 10/15/23 1422 10/16/23 0555  LIPASE 82* 24 42   No results for input(s): AMMONIA in the last 168 hours. Coagulation Profile: No results for input(s): INR, PROTIME in the last 168 hours. Cardiac Enzymes: No results for input(s): CKTOTAL, CKMB, CKMBINDEX, TROPONINI in the last 168 hours. BNP (last 3 results) No results for input(s): PROBNP in the last 8760 hours. HbA1C: No results for input(s): HGBA1C in the last 72 hours. CBG: No results for  input(s): GLUCAP in the last 168 hours. Lipid Profile: No results for input(s): CHOL, HDL, LDLCALC, TRIG, CHOLHDL, LDLDIRECT in the last 72 hours. Thyroid  Function Tests: Recent Labs    10/16/23 1147  FREET4 1.05   Anemia Panel: No results for input(s): VITAMINB12, FOLATE, FERRITIN, TIBC, IRON, RETICCTPCT in the last 72 hours. Sepsis Labs: Recent Labs  Lab 10/15/23 1954 10/16/23 1147  PROCALCITON  --  <0.10  LATICACIDVEN 1.5  --     Recent Results (from the past 240 hours)  Resp panel by RT-PCR (RSV, Flu A&B, Covid) Anterior Nasal Swab     Status: None   Collection Time: 10/14/23  1:25 AM   Specimen: Anterior Nasal Swab  Result Value Ref Range Status   SARS Coronavirus 2 by RT PCR NEGATIVE NEGATIVE Final    Comment: (NOTE) SARS-CoV-2 target nucleic acids are NOT DETECTED.  The SARS-CoV-2 RNA is generally detectable in upper respiratory specimens during the acute phase of infection. The lowest concentration of SARS-CoV-2 viral copies this assay can detect is 138 copies/mL. A negative result does not preclude SARS-Cov-2 infection and should not be used as the sole basis for treatment or other patient management decisions. A negative result may occur with  improper specimen collection/handling, submission of specimen other than nasopharyngeal swab, presence of viral mutation(s) within the areas targeted by this assay, and inadequate number of viral copies(<138 copies/mL). A negative result must be combined with clinical observations, patient history, and epidemiological information. The expected result is Negative.  Fact Sheet for Patients:  BloggerCourse.com  Fact Sheet for Healthcare Providers:  SeriousBroker.it  This test is no t yet approved or cleared by the [place]  FDA and  has been authorized for detection and/or diagnosis of SARS-CoV-2 by FDA under an Emergency Use Authorization  (EUA). This EUA will remain  in effect (meaning this test can be used) for the duration of the COVID-19 declaration under Section 564(b)(1) of the Act, 21 U.S.C.section 360bbb-3(b)(1), unless the authorization is terminated  or revoked sooner.       Influenza A by PCR NEGATIVE NEGATIVE Final   Influenza B by PCR NEGATIVE NEGATIVE Final    Comment: (NOTE) The Xpert Xpress SARS-CoV-2/FLU/RSV plus assay is intended as an aid in the diagnosis of influenza from Nasopharyngeal swab specimens and should not be used as a sole basis for treatment. Nasal washings and aspirates are unacceptable for Xpert Xpress SARS-CoV-2/FLU/RSV testing.  Fact Sheet for Patients: BloggerCourse.com  Fact Sheet for Healthcare Providers: SeriousBroker.it  This test is not yet approved  or cleared by the [place]  FDA and has been authorized for detection and/or diagnosis of SARS-CoV-2 by FDA under an Emergency Use Authorization (EUA). This EUA will remain in effect (meaning this test can be used) for the duration of the COVID-19 declaration under Section 564(b)(1) of the Act, 21 U.S.C. section 360bbb-3(b)(1), unless the authorization is terminated or revoked.     Resp Syncytial Virus by PCR NEGATIVE NEGATIVE Final    Comment: (NOTE) Fact Sheet for Patients: BloggerCourse.com  Fact Sheet for Healthcare Providers: SeriousBroker.it  This test is not yet approved or cleared by the [place]  FDA and has been authorized for detection and/or diagnosis of SARS-CoV-2 by FDA under an Emergency Use Authorization (EUA). This EUA will remain in effect (meaning this test can be used) for the duration of the COVID-19 declaration under Section 564(b)(1) of the Act, 21 U.S.C. section 360bbb-3(b)(1), unless the authorization is terminated or revoked.  Performed at [facility], [address] [place], [place] [address]   Culture, blood (routine x 2)     Status: None (Preliminary result)   Collection Time: 10/15/23  7:52 PM   Specimen: BLOOD  Result Value Ref Range Status   Specimen Description BLOOD SITE NOT SPECIFIED  Final   Special Requests   Final    BOTTLES DRAWN AEROBIC AND ANAEROBIC Blood Culture adequate volume   Culture   Final    NO GROWTH 2 DAYS Performed at [facility], [address] [place], [place] [address]    Report Status PENDING  Incomplete  Culture, blood (routine x 2)     Status: None (Preliminary result)   Collection Time: 10/15/23  7:52 PM   Specimen: BLOOD LEFT ARM  Result Value Ref Range Status   Specimen Description BLOOD LEFT ARM  Final   Special Requests   Final    BOTTLES DRAWN AEROBIC AND ANAEROBIC Blood Culture adequate volume   Culture   Final    NO GROWTH 1 DAY Performed at [facility], [address] [place], [place] [address]    Report Status PENDING  Incomplete  Wet prep, genital     Status: Abnormal   Collection Time: 10/16/23  1:05 AM   Specimen: PATH Cytology Cervicovaginal Ancillary Only  Result Value Ref Range Status   Yeast Wet Prep HPF POC NONE SEEN NONE SEEN Final   Trich, Wet Prep NONE SEEN NONE SEEN Final   Clue Cells Wet Prep HPF POC PRESENT (A) NONE SEEN Final   WBC, Wet Prep HPF POC <10 <10 Final   Sperm NONE SEEN  Final    Comment: Performed at [facility], [address] [place], [place] [address]  Urine Culture     Status: Abnormal   Collection Time: 10/16/23  1:12 AM   Specimen: Urine, Clean Catch  Result Value Ref Range Status   Specimen Description URINE, CLEAN CATCH  Final   Special Requests NONE  Final   Culture (A)  Final    <10,000 COLONIES/mL INSIGNIFICANT GROWTH Performed at [facility], [address], [place], [place] [address]    Report Status 10/17/2023 FINAL  Final  SARS Coronavirus 2 by RT PCR (hospital order, performed in [facility] hospital lab) *cepheid  single result test*     Status: None   Collection Time: 10/16/23 10:49 AM   Specimen: Nasal Swab  Result Value Ref Range Status   SARS Coronavirus 2 by RT PCR NEGATIVE NEGATIVE  Final    Comment: Performed at [facility], [address], [place], [place] [address]  Respiratory (~20 pathogens) panel by PCR     Status: None   Collection Time: 10/16/23 10:49 AM   Specimen: Respiratory  Result Value Ref Range Status   Adenovirus NOT DETECTED NOT DETECTED Final   Coronavirus 229E NOT DETECTED NOT DETECTED Final    Comment: (NOTE) The Coronavirus on the Respiratory Panel, DOES NOT test for the novel  Coronavirus (2019 nCoV)    Coronavirus HKU1 NOT DETECTED NOT DETECTED Final   Coronavirus NL63 NOT DETECTED NOT DETECTED Final   Coronavirus OC43 NOT DETECTED NOT DETECTED Final   Metapneumovirus NOT DETECTED NOT DETECTED Final   Rhinovirus / Enterovirus NOT DETECTED NOT DETECTED Final   Influenza A NOT DETECTED NOT DETECTED Final   Influenza B NOT DETECTED NOT DETECTED Final   Parainfluenza Virus 1 NOT DETECTED NOT DETECTED Final   Parainfluenza Virus 2 NOT DETECTED NOT DETECTED Final   Parainfluenza Virus 3 NOT DETECTED NOT DETECTED Final   Parainfluenza Virus 4 NOT DETECTED NOT DETECTED Final   Respiratory Syncytial Virus NOT DETECTED NOT DETECTED Final   Bordetella pertussis NOT DETECTED NOT DETECTED Final   Bordetella Parapertussis NOT DETECTED NOT DETECTED Final   Chlamydophila pneumoniae NOT DETECTED NOT DETECTED Final   Mycoplasma pneumoniae NOT DETECTED NOT DETECTED Final    Comment: Performed at [facility], [address] [place], [place] [address]         Radiology Studies: DG Chest Port 1 View Result Date: 10/16/2023 CLINICAL DATA:  [id number].  Fever. EXAM: PORTABLE CHEST 1 VIEW COMPARISON:  PA Lat chest 06/20/2017. FINDINGS: There are scattered ill-defined subtle hazy opacities in the periphery of the upper to mid lung fields concerning for atypical pneumonia.  No dense consolidation is seen. Or central lungs are otherwise clear. The cardiomediastinal silhouette, vascular pattern are normal. No pleural effusion is seen. Thoracic cage is intact. IMPRESSION: Scattered ill-defined subtle hazy opacities in the periphery of the upper to mid lung fields concerning for atypical pneumonia. Clinical correlation and radiographic follow-up recommended preferably with PA Lat views. Electronically Signed   By: Francis Quam M.D.   On: 10/16/2023 04:59   US  Abdomen Limited RUQ (LIVER/GB) Result Date: 10/16/2023 CLINICAL DATA:  [id number].  Right upper quadrant pain. EXAM: ULTRASOUND ABDOMEN LIMITED RIGHT UPPER QUADRANT COMPARISON:  CT today and 10/13/2023. FINDINGS: Gallbladder: No gallstones or wall thickening visualized. No sonographic Murphy sign noted by sonographer. Common bile duct: Diameter: 2.9 mm.  No intrahepatic biliary dilatation. Liver: No focal lesion identified. Within normal limits in parenchymal echogenicity. Portal vein is patent on color Doppler imaging with normal direction of blood flow towards the liver. Other: No right upper quadrant ascites. IMPRESSION: Negative ultrasound. Electronically Signed   By: Francis Quam M.D.   On: 10/16/2023 02:58   CT ABDOMEN PELVIS W CONTRAST Result Date: 10/15/2023 CLINICAL DATA:  Acute abdominal pain EXAM: CT ABDOMEN AND PELVIS WITH CONTRAST TECHNIQUE: Multidetector CT imaging of the abdomen and pelvis was performed using the standard protocol following bolus administration of intravenous contrast. RADIATION DOSE REDUCTION: This exam was performed according to the departmental dose-optimization program which includes automated exposure control, adjustment of the mA and/or kV according to patient size and/or use of iterative reconstruction technique. CONTRAST:  75mL OMNIPAQUE  IOHEXOL  350 MG/ML SOLN COMPARISON:  CT abdomen pelvis October 13, 2023 FINDINGS: Lower chest: Mild centrilobular emphysematous changes. Hepatobiliary:  Focal fat in segment 4  along the falciform ligament, typical location. No enhancing liver lesion. Gallbladder is unremarkable. No intrahepatic biliary dilatation. CBD measures 6.7 mm. Pancreas: Pancreatic duct is slightly prominent measures up to 3.9 mm in the pancreatic head and neck. No definite pancreatic mass identified. No peripancreatic fat stranding or fluid collection. Spleen: Normal spleen. Adrenals/Urinary Tract: A few punctate bilateral cortical hypodensities in both kidneys, too small to characterize likely representing small simple cysts which does not require imaging follow-up. No hydronephrosis or enhancing mass lesion. Both adrenal glands are unremarkable. Normal bladder. Stomach/Bowel: Prominent gastric folds otherwise unremarkable stomach. Colonic diverticulosis without CT findings to suggest diverticulitis. Appendix is not visualized. Large stool is identified throughout the colon. Gaseous distention of splenic flexure of the colon. Vascular/Lymphatic: Aortic atherosclerosis. No enlarged abdominal or pelvic lymph nodes. Reproductive: Enlarged heterogeneous fibroid uterus no definite adnexal mass. Other: No hiatal hernia.  No ascites. Musculoskeletal: No suspicious osseous lesion. IMPRESSION: Colonic diverticulosis without diverticulitis. Large stool throughout the colon with gaseous distention of the splenic flexure of the colon. Uterine fibroids. Electronically Signed   By: Megan  Zare M.D.   On: 10/15/2023 20:53        Scheduled Meds:  enoxaparin  (LOVENOX ) injection  40 mg Subcutaneous Daily   metroNIDAZOLE   500 mg Oral Q12H   polyethylene glycol  17 g Oral Daily   senna  2 tablet Oral Daily   sodium chloride  flush  3 mL Intravenous Q12H   Continuous Infusions:   LOS: 0 days    Time spent: 45 minutes spent on chart review, discussion with nursing staff, consultants, updating family and interview/physical exam; more than 50% of that time was spent in counseling and/or  coordination of care.    Harlene RAYMOND Bowl, DO [facility] Available via Epic secure chat 7am-7pm After these hours, please refer to coverage provider listed on [email or web address] 10/17/2023, 11:11 AM

## 2023-10-18 ENCOUNTER — Other Ambulatory Visit (HOSPITAL_COMMUNITY): Payer: Self-pay

## 2023-10-18 DIAGNOSIS — N760 Acute vaginitis: Secondary | ICD-10-CM | POA: Diagnosis not present

## 2023-10-18 DIAGNOSIS — B9689 Other specified bacterial agents as the cause of diseases classified elsewhere: Secondary | ICD-10-CM | POA: Diagnosis not present

## 2023-10-18 DIAGNOSIS — K5900 Constipation, unspecified: Secondary | ICD-10-CM | POA: Diagnosis not present

## 2023-10-18 DIAGNOSIS — R109 Unspecified abdominal pain: Secondary | ICD-10-CM | POA: Diagnosis not present

## 2023-10-18 MED ORDER — LACTULOSE 10 GM/15ML PO SOLN
20.0000 g | Freq: Two times a day (BID) | ORAL | 0 refills | Status: AC | PRN
  Filled 2023-10-18: qty 236, 4d supply, fill #0

## 2023-10-18 MED ORDER — METRONIDAZOLE 500 MG PO TABS
500.0000 mg | ORAL_TABLET | Freq: Two times a day (BID) | ORAL | 0 refills | Status: AC
  Filled 2023-10-18: qty 10, 5d supply, fill #0

## 2023-10-18 MED ORDER — ONDANSETRON 4 MG PO TBDP
4.0000 mg | ORAL_TABLET | Freq: Three times a day (TID) | ORAL | 0 refills | Status: AC | PRN
  Filled 2023-10-18: qty 20, 7d supply, fill #0

## 2023-10-18 MED ORDER — HYDROCORTISONE ACETATE 25 MG RE SUPP
25.0000 mg | Freq: Two times a day (BID) | RECTAL | Status: DC
  Administered 2023-10-18: 25 mg via RECTAL
  Filled 2023-10-18: qty 1

## 2023-10-18 MED ORDER — SENNA 8.6 MG PO TABS: 2.0000 | ORAL_TABLET | Freq: Every day | ORAL | Status: AC | PRN

## 2023-10-18 MED ORDER — WITCH HAZEL-GLYCERIN EX PADS
MEDICATED_PAD | CUTANEOUS | Status: DC | PRN
  Filled 2023-10-18: qty 100

## 2023-10-18 MED ORDER — ACETAMINOPHEN 325 MG PO TABS: 650.0000 mg | ORAL_TABLET | Freq: Four times a day (QID) | ORAL | Status: AC | PRN

## 2023-10-18 MED ORDER — HYDROCORTISONE ACETATE 25 MG RE SUPP
25.0000 mg | Freq: Two times a day (BID) | RECTAL | 0 refills | Status: AC
  Filled 2023-10-18: qty 12, 6d supply, fill #0

## 2023-10-18 MED ORDER — LACTULOSE 10 GM/15ML PO SOLN: 20.0000 g | Freq: Two times a day (BID) | ORAL | Status: DC | PRN

## 2023-10-18 MED ORDER — BISACODYL 10 MG RE SUPP: 10.0000 mg | Freq: Every day | RECTAL | Status: AC | PRN

## 2023-10-18 MED ORDER — POLYETHYLENE GLYCOL 3350 17 G PO PACK: 17.0000 g | PACK | Freq: Every day | ORAL | Status: AC

## 2023-10-18 NOTE — Discharge Summary (Signed)
 Physician Discharge Summary  [name] [name] FMW:[id number] DOB: [birth year] DOA: 10/15/2023  PCP: Patient, No Pcp Per-patient to establish with PCP  Admit date: 10/15/2023 Discharge date: 10/18/2023  Admitted From:  Discharge disposition: Home   Recommendations for Outpatient Follow-Up:   Outpatient colonoscopy Cocaine cessation Bowel regimen   Discharge Diagnosis:   Principal Problem:   Abdominal pain Active Problems:   SIRS (systemic inflammatory response syndrome) (HCC)   Abnormal chest x-ray   Bacterial vaginosis   Constipation   History of thyroid  disease    Discharge Condition: Improved.  Diet recommendation:  Regular.  Wound care: None.  Code status: Full.   History of Present Illness:   [name] [name] is a 58 y.o. female with medical history significant of hypertension, CAD s/p stent, MI, thyroid  disease, anxiety, depression, and polysubstance abuse presents with abdominal pain.   She has been experiencing abdominal pain since Monday, which prompted her visit to [facility] emergency department. A scan was performed, revealing no significant findings. She says she was discharged home with a fever of 102F. Since returning home, her abdominal pain has worsened. She has not experienced nausea, vomiting, or diarrhea since Monday and has not had a bowel movement since then.   She has a history of emphysema and quit smoking cigarettes ten years ago, although she continues to smoke marijuana. She does not have current cough or cold symptoms, although she occasionally coughs without it being persistent.   In the emergency department patient was noted to be febrile up to 101.1 F with tachycardia and all other vital signs relatively maintained.  Labs significant for WBC 15.6, potassium 3.1, BUN 5, creatinine 1.05, and lactic acid 1.5.  Urinalysis was appeared to be contaminated.  Wet prep appeared to show clue cells present.  CT scan of the abdomen pelvis noted  colonic diverticulosis without diverticulitis, large stool throughout the colon with gaseous distention of the splenic flexure of the colon, and uterine fibroids present.  Right upper quadrant ultrasound was negative for any acute abnormality.  Chest x-ray noted scattered ill-defined subtle hazy opacities in the periphery of the upper and mid lung fields concerning for atypical pneumonia.  Patient had been given 1 L of lactated Ringer 's, Zofran , morphine  IV, potassium chloride  40 meq, Dilaudid , and metronidazole .   Hospital Course by Problem:   SIRS Abdominal pain -See below regarding constipation/PUD   Abnormal chest x-ray -Workup essentially negative for bacterial pneumonia - Will DC IV antibiotics - Suspect due to cocaine   Bacterial vaginosis Present on admission.  Wet prep was obtained which noted clue cells present.  Patient was started on empiric antibiotics of metronidazole . - Continue metronidazole  x 7 days   Constipation with hemorrhoids Acute.  CT scan of the abdomen and pelvis noted large stool throughout the colon with gaseous distention of the splenic flexure of the colon. - Aggressive bowel regimen-improved - Avoid narcotics -Treat hemorrhoids   History of thyroid  disease TSH noted to be 0.51 when checked on 9/15. - Normal free T4   Polysubstance abuse -Encourage cessation    Medical Consultants:      Discharge Exam:   Vitals:   10/18/23 0356 10/18/23 0751  BP: (!) 129/91 121/72  Pulse: 87 73  Resp: 18 16  Temp: 98.4 F (36.9 C) 98.9 F (37.2 C)  SpO2:  98%   Vitals:   10/17/23 2022 10/17/23 2320 10/18/23 0356 10/18/23 0751  BP: 128/75 (!) 122/91 (!) 129/91 121/72  Pulse: 79  91 87 73  Resp: 15 20 18 16   Temp: 98.6 F (37 C) 97.9 F (36.6 C) 98.4 F (36.9 C) 98.9 F (37.2 C)  TempSrc: Oral Oral Oral Oral  SpO2:    98%  Weight:   52.7 kg     General exam: Appears calm and comfortable.   The results of significant diagnostics from this  hospitalization (including imaging, microbiology, ancillary and laboratory) are listed below for reference.     Procedures and Diagnostic Studies:   DG Chest Port 1 View Result Date: 10/16/2023 CLINICAL DATA:  [id number].  Fever. EXAM: PORTABLE CHEST 1 VIEW COMPARISON:  PA Lat chest 06/20/2017. FINDINGS: There are scattered ill-defined subtle hazy opacities in the periphery of the upper to mid lung fields concerning for atypical pneumonia. No dense consolidation is seen. Or central lungs are otherwise clear. The cardiomediastinal silhouette, vascular pattern are normal. No pleural effusion is seen. Thoracic cage is intact. IMPRESSION: Scattered ill-defined subtle hazy opacities in the periphery of the upper to mid lung fields concerning for atypical pneumonia. Clinical correlation and radiographic follow-up recommended preferably with PA Lat views. Electronically Signed   By: Francis Quam M.D.   On: 10/16/2023 04:59   US  Abdomen Limited RUQ (LIVER/GB) Result Date: 10/16/2023 CLINICAL DATA:  [id number].  Right upper quadrant pain. EXAM: ULTRASOUND ABDOMEN LIMITED RIGHT UPPER QUADRANT COMPARISON:  CT today and 10/13/2023. FINDINGS: Gallbladder: No gallstones or wall thickening visualized. No sonographic Murphy sign noted by sonographer. Common bile duct: Diameter: 2.9 mm.  No intrahepatic biliary dilatation. Liver: No focal lesion identified. Within normal limits in parenchymal echogenicity. Portal vein is patent on color Doppler imaging with normal direction of blood flow towards the liver. Other: No right upper quadrant ascites. IMPRESSION: Negative ultrasound. Electronically Signed   By: Francis Quam M.D.   On: 10/16/2023 02:58   CT ABDOMEN PELVIS W CONTRAST Result Date: 10/15/2023 CLINICAL DATA:  Acute abdominal pain EXAM: CT ABDOMEN AND PELVIS WITH CONTRAST TECHNIQUE: Multidetector CT imaging of the abdomen and pelvis was performed using the standard protocol following bolus administration of intravenous  contrast. RADIATION DOSE REDUCTION: This exam was performed according to the departmental dose-optimization program which includes automated exposure control, adjustment of the mA and/or kV according to patient size and/or use of iterative reconstruction technique. CONTRAST:  75mL OMNIPAQUE  IOHEXOL  350 MG/ML SOLN COMPARISON:  CT abdomen pelvis October 13, 2023 FINDINGS: Lower chest: Mild centrilobular emphysematous changes. Hepatobiliary: Focal fat in segment 4 along the falciform ligament, typical location. No enhancing liver lesion. Gallbladder is unremarkable. No intrahepatic biliary dilatation. CBD measures 6.7 mm. Pancreas: Pancreatic duct is slightly prominent measures up to 3.9 mm in the pancreatic head and neck. No definite pancreatic mass identified. No peripancreatic fat stranding or fluid collection. Spleen: Normal spleen. Adrenals/Urinary Tract: A few punctate bilateral cortical hypodensities in both kidneys, too small to characterize likely representing small simple cysts which does not require imaging follow-up. No hydronephrosis or enhancing mass lesion. Both adrenal glands are unremarkable. Normal bladder. Stomach/Bowel: Prominent gastric folds otherwise unremarkable stomach. Colonic diverticulosis without CT findings to suggest diverticulitis. Appendix is not visualized. Large stool is identified throughout the colon. Gaseous distention of splenic flexure of the colon. Vascular/Lymphatic: Aortic atherosclerosis. No enlarged abdominal or pelvic lymph nodes. Reproductive: Enlarged heterogeneous fibroid uterus no definite adnexal mass. Other: No hiatal hernia.  No ascites. Musculoskeletal: No suspicious osseous lesion. IMPRESSION: Colonic diverticulosis without diverticulitis. Large stool throughout the colon with gaseous distention of the splenic flexure of the colon.  Uterine fibroids. Electronically Signed   By: Megan  Zare M.D.   On: 10/15/2023 20:53     Labs:   Basic Metabolic  Panel: Recent Labs  Lab 10/13/23 1947 10/15/23 1422 10/15/23 1428 10/16/23 0555 10/17/23 0648  NA 141 140 140 140 142  K 3.4* 3.1* 3.0* 3.7 3.5  CL 102 100 100 100 105  CO2 25 25  --  24 28  GLUCOSE 147* 111* 112* 139* 103*  BUN 8 5* 5* <5* 5*  CREATININE 0.80 1.05* 0.90 0.86 0.87  CALCIUM  9.6 9.1  --  8.8* 8.8*  MG  --   --   --  1.7  --    GFR CrCl cannot be calculated (Unknown ideal weight.). Liver Function Tests: Recent Labs  Lab 10/13/23 1947 10/15/23 1422 10/16/23 0555  AST 15 17 22   ALT 8 10 9   ALKPHOS 76 63 59  BILITOT 0.7 0.8 1.4*  PROT 8.0 7.5 6.8  ALBUMIN 4.2 3.3* 3.1*   Recent Labs  Lab 10/13/23 1947 10/15/23 1422 10/16/23 0555  LIPASE 82* 24 42   No results for input(s): AMMONIA in the last 168 hours. Coagulation profile No results for input(s): INR, PROTIME in the last 168 hours.  CBC: Recent Labs  Lab 10/13/23 1947 10/15/23 1422 10/15/23 1428 10/16/23 0555 10/17/23 0648  WBC 13.6* 15.6*  --  14.1* 6.3  NEUTROABS  --  12.4*  --  11.1*  --   HGB 14.5 13.3 13.9 12.4 12.0  HCT 46.4* 40.4 41.0 38.0 36.5  MCV 88.2 86.7  --  86.4 87.5  PLT 285 278  --  246 293   Cardiac Enzymes: No results for input(s): CKTOTAL, CKMB, CKMBINDEX, TROPONINI in the last 168 hours. BNP: Invalid input(s): POCBNP CBG: No results for input(s): GLUCAP in the last 168 hours. D-Dimer No results for input(s): DDIMER in the last 72 hours. Hgb A1c No results for input(s): HGBA1C in the last 72 hours. Lipid Profile No results for input(s): CHOL, HDL, LDLCALC, TRIG, CHOLHDL, LDLDIRECT in the last 72 hours. Thyroid  function studies No results for input(s): TSH, T4TOTAL, T3FREE, THYROIDAB in the last 72 hours.  Invalid input(s): FREET3 Anemia work up No results for input(s): VITAMINB12, FOLATE, FERRITIN, TIBC, IRON, RETICCTPCT in the last 72 hours. Microbiology Recent Results (from the past 240 hours)  Resp  panel by RT-PCR (RSV, Flu A&B, Covid) Anterior Nasal Swab     Status: None   Collection Time: 10/14/23  1:25 AM   Specimen: Anterior Nasal Swab  Result Value Ref Range Status   SARS Coronavirus 2 by RT PCR NEGATIVE NEGATIVE Final    Comment: (NOTE) SARS-CoV-2 target nucleic acids are NOT DETECTED.  The SARS-CoV-2 RNA is generally detectable in upper respiratory specimens during the acute phase of infection. The lowest concentration of SARS-CoV-2 viral copies this assay can detect is 138 copies/mL. A negative result does not preclude SARS-Cov-2 infection and should not be used as the sole basis for treatment or other patient management decisions. A negative result may occur with  improper specimen collection/handling, submission of specimen other than nasopharyngeal swab, presence of viral mutation(s) within the areas targeted by this assay, and inadequate number of viral copies(<138 copies/mL). A negative result must be combined with clinical observations, patient history, and epidemiological information. The expected result is Negative.  Fact Sheet for Patients:  BloggerCourse.com  Fact Sheet for Healthcare Providers:  SeriousBroker.it  This test is no t yet approved or cleared by the [place]  FDA  and  has been authorized for detection and/or diagnosis of SARS-CoV-2 by FDA under an Emergency Use Authorization (EUA). This EUA will remain  in effect (meaning this test can be used) for the duration of the COVID-19 declaration under Section 564(b)(1) of the Act, 21 U.S.C.section 360bbb-3(b)(1), unless the authorization is terminated  or revoked sooner.       Influenza A by PCR NEGATIVE NEGATIVE Final   Influenza B by PCR NEGATIVE NEGATIVE Final    Comment: (NOTE) The Xpert Xpress SARS-CoV-2/FLU/RSV plus assay is intended as an aid in the diagnosis of influenza from Nasopharyngeal swab specimens and should not be used as a  sole basis for treatment. Nasal washings and aspirates are unacceptable for Xpert Xpress SARS-CoV-2/FLU/RSV testing.  Fact Sheet for Patients: BloggerCourse.com  Fact Sheet for Healthcare Providers: SeriousBroker.it  This test is not yet approved or cleared by the [place]  FDA and has been authorized for detection and/or diagnosis of SARS-CoV-2 by FDA under an Emergency Use Authorization (EUA). This EUA will remain in effect (meaning this test can be used) for the duration of the COVID-19 declaration under Section 564(b)(1) of the Act, 21 U.S.C. section 360bbb-3(b)(1), unless the authorization is terminated or revoked.     Resp Syncytial Virus by PCR NEGATIVE NEGATIVE Final    Comment: (NOTE) Fact Sheet for Patients: BloggerCourse.com  Fact Sheet for Healthcare Providers: SeriousBroker.it  This test is not yet approved or cleared by the [place]  FDA and has been authorized for detection and/or diagnosis of SARS-CoV-2 by FDA under an Emergency Use Authorization (EUA). This EUA will remain in effect (meaning this test can be used) for the duration of the COVID-19 declaration under Section 564(b)(1) of the Act, 21 U.S.C. section 360bbb-3(b)(1), unless the authorization is terminated or revoked.  Performed at [facility], [address], [place], [place] [address]   Culture, blood (routine x 2)     Status: None (Preliminary result)   Collection Time: 10/15/23  7:52 PM   Specimen: BLOOD  Result Value Ref Range Status   Specimen Description BLOOD SITE NOT SPECIFIED  Final   Special Requests   Final    BOTTLES DRAWN AEROBIC AND ANAEROBIC Blood Culture adequate volume   Culture   Final    NO GROWTH 2 DAYS Performed at [facility], [address] [place], [place] [address]    Report Status PENDING  Incomplete  Culture, blood (routine x 2)      Status: None (Preliminary result)   Collection Time: 10/15/23  7:52 PM   Specimen: BLOOD LEFT ARM  Result Value Ref Range Status   Specimen Description BLOOD LEFT ARM  Final   Special Requests   Final    BOTTLES DRAWN AEROBIC AND ANAEROBIC Blood Culture adequate volume   Culture   Final    NO GROWTH 1 DAY Performed at [facility], [address] [place], [place] [address]    Report Status PENDING  Incomplete  Wet prep, genital     Status: Abnormal   Collection Time: 10/16/23  1:05 AM   Specimen: PATH Cytology Cervicovaginal Ancillary Only  Result Value Ref Range Status   Yeast Wet Prep HPF POC NONE SEEN NONE SEEN Final   Trich, Wet Prep NONE SEEN NONE SEEN Final   Clue Cells Wet Prep HPF POC PRESENT (A) NONE SEEN Final   WBC, Wet Prep HPF POC <10 <10 Final   Sperm NONE SEEN  Final    Comment:  Performed at [facility], [address], [place], [place] [address]  Urine Culture     Status: Abnormal   Collection Time: 10/16/23  1:12 AM   Specimen: Urine, Clean Catch  Result Value Ref Range Status   Specimen Description URINE, CLEAN CATCH  Final   Special Requests NONE  Final   Culture (A)  Final    <10,000 COLONIES/mL INSIGNIFICANT GROWTH Performed at [facility], [address], [place], [place] [address]    Report Status 10/17/2023 FINAL  Final  SARS Coronavirus 2 by RT PCR (hospital order, performed in [facility] hospital lab) *cepheid single result test*     Status: None   Collection Time: 10/16/23 10:49 AM   Specimen: Nasal Swab  Result Value Ref Range Status   SARS Coronavirus 2 by RT PCR NEGATIVE NEGATIVE Final    Comment: Performed at [facility], [address] [place], [place] [address]  Respiratory (~20 pathogens) panel by PCR     Status: None   Collection Time: 10/16/23 10:49 AM   Specimen: Respiratory  Result Value Ref Range Status   Adenovirus NOT DETECTED NOT DETECTED Final   Coronavirus 229E NOT DETECTED NOT DETECTED Final     Comment: (NOTE) The Coronavirus on the Respiratory Panel, DOES NOT test for the novel  Coronavirus (2019 nCoV)    Coronavirus HKU1 NOT DETECTED NOT DETECTED Final   Coronavirus NL63 NOT DETECTED NOT DETECTED Final   Coronavirus OC43 NOT DETECTED NOT DETECTED Final   Metapneumovirus NOT DETECTED NOT DETECTED Final   Rhinovirus / Enterovirus NOT DETECTED NOT DETECTED Final   Influenza A NOT DETECTED NOT DETECTED Final   Influenza B NOT DETECTED NOT DETECTED Final   Parainfluenza Virus 1 NOT DETECTED NOT DETECTED Final   Parainfluenza Virus 2 NOT DETECTED NOT DETECTED Final   Parainfluenza Virus 3 NOT DETECTED NOT DETECTED Final   Parainfluenza Virus 4 NOT DETECTED NOT DETECTED Final   Respiratory Syncytial Virus NOT DETECTED NOT DETECTED Final   Bordetella pertussis NOT DETECTED NOT DETECTED Final   Bordetella Parapertussis NOT DETECTED NOT DETECTED Final   Chlamydophila pneumoniae NOT DETECTED NOT DETECTED Final   Mycoplasma pneumoniae NOT DETECTED NOT DETECTED Final    Comment: Performed at [facility], [address] [place], [place] [address]     Discharge Instructions:   Discharge Instructions     Diet general   Complete by: As directed    Discharge instructions   Complete by: As directed    Can use sitz bath for help with hemorrhoidal pain Bowel regimen for soft daily BMs Follow up with PCP   Increase activity slowly   Complete by: As directed       Allergies as of 10/18/2023       Reactions   Demerol  [meperidine ] Itching   Latex Itching, Rash   gloves   Novocain [procaine Hcl] Swelling        Medication List     STOP taking these medications    dicyclomine  20 MG tablet Commonly known as: BENTYL    valACYclovir 500 MG tablet Commonly known as: VALTREX       TAKE these medications    acetaminophen  325 MG tablet Commonly known as: TYLENOL  Take 2 tablets (650 mg total) by mouth every 6 (six) hours as needed for mild pain (pain score 1-3) (or  Fever >/= 101).   albuterol  108 (90 Base) MCG/ACT inhaler Commonly known as: ProAir  HFA INHALE TWO  PUFFS INTO LUNGS EVERY 4 HOURS AS NEEDED FOR WHEEZING FOR SHORTNESS OF BREATH   aspirin  EC 81 MG tablet Take 1 tablet (81 mg total) by mouth daily.   bisacodyl  10 MG suppository Commonly known as: DULCOLAX Place 1 suppository (10 mg total) rectally daily as needed for moderate constipation.   hydrocortisone  25 MG suppository Commonly known as: ANUSOL -HC Place 1 suppository (25 mg total) rectally 2 (two) times daily.   lactulose  10 GM/15ML solution Commonly known as: CHRONULAC  Take 30 mLs (20 g total) by mouth 2 (two) times daily as needed for mild constipation.   loratadine  10 MG tablet Commonly known as: CLARITIN  Take 10 mg by mouth daily.   metroNIDAZOLE  500 MG tablet Commonly known as: FLAGYL  Take 1 tablet (500 mg total) by mouth every 12 (twelve) hours.   ondansetron  4 MG disintegrating tablet Commonly known as: ZOFRAN -ODT Take 1 tablet (4 mg total) by mouth every 8 (eight) hours as needed.   polyethylene glycol 17 g packet Commonly known as: MIRALAX  / GLYCOLAX  Take 17 g by mouth daily.   senna 8.6 MG Tabs tablet Commonly known as: SENOKOT Take 2 tablets (17.2 mg total) by mouth daily as needed for mild constipation.          Time coordinating discharge: 45 min  Signed:  Harlene RAYMOND Bowl DO  [facility] 10/18/2023, 9:21 AM

## 2023-10-18 NOTE — Plan of Care (Signed)
  Problem: Health Behavior/Discharge Planning: Goal: Ability to manage health-related needs will improve Outcome: Adequate for Discharge   Problem: Clinical Measurements: Goal: Ability to maintain clinical measurements within normal limits will improve Outcome: Adequate for Discharge Goal: Will remain free from infection Outcome: Adequate for Discharge Goal: Diagnostic test results will improve Outcome: Adequate for Discharge Goal: Respiratory complications will improve Outcome: Adequate for Discharge Goal: Cardiovascular complication will be avoided Outcome: Adequate for Discharge   Problem: Coping: Goal: Level of anxiety will decrease Outcome: Adequate for Discharge   Problem: Elimination: Goal: Will not experience complications related to bowel motility Outcome: Adequate for Discharge Goal: Will not experience complications related to urinary retention Outcome: Adequate for Discharge   Problem: Safety: Goal: Ability to remain free from injury will improve Outcome: Adequate for Discharge   Problem: Skin Integrity: Goal: Risk for impaired skin integrity will decrease Outcome: Adequate for Discharge

## 2023-10-20 LAB — CULTURE, BLOOD (ROUTINE X 2)
Culture: NO GROWTH
Special Requests: ADEQUATE

## 2023-10-21 LAB — CULTURE, BLOOD (ROUTINE X 2)
Culture: NO GROWTH
Special Requests: ADEQUATE
